# Patient Record
Sex: Male | Born: 1945
Health system: Southern US, Community
[De-identification: ages and names within clinical notes are randomized; demographics above are authoritative.]

## PROBLEM LIST (undated history)

## (undated) DIAGNOSIS — Z9621 Cochlear implant status: Secondary | ICD-10-CM

## (undated) DIAGNOSIS — G4730 Sleep apnea, unspecified: Secondary | ICD-10-CM

## (undated) DIAGNOSIS — K759 Inflammatory liver disease, unspecified: Secondary | ICD-10-CM

## (undated) DIAGNOSIS — F419 Anxiety disorder, unspecified: Secondary | ICD-10-CM

## (undated) DIAGNOSIS — T300 Burn of unspecified body region, unspecified degree: Secondary | ICD-10-CM

## (undated) DIAGNOSIS — E785 Hyperlipidemia, unspecified: Secondary | ICD-10-CM

## (undated) DIAGNOSIS — R3915 Urgency of urination: Secondary | ICD-10-CM

## (undated) DIAGNOSIS — E119 Type 2 diabetes mellitus without complications: Secondary | ICD-10-CM

## (undated) DIAGNOSIS — D689 Coagulation defect, unspecified: Secondary | ICD-10-CM

## (undated) DIAGNOSIS — C61 Malignant neoplasm of prostate: Secondary | ICD-10-CM

## (undated) DIAGNOSIS — J189 Pneumonia, unspecified organism: Secondary | ICD-10-CM

## (undated) DIAGNOSIS — K219 Gastro-esophageal reflux disease without esophagitis: Secondary | ICD-10-CM

## (undated) DIAGNOSIS — K2270 Barrett's esophagus without dysplasia: Secondary | ICD-10-CM

## (undated) DIAGNOSIS — M1990 Unspecified osteoarthritis, unspecified site: Secondary | ICD-10-CM

## (undated) DIAGNOSIS — Z86010 Personal history of colonic polyps: Secondary | ICD-10-CM

## (undated) DIAGNOSIS — F32A Depression, unspecified: Secondary | ICD-10-CM

## (undated) DIAGNOSIS — N281 Cyst of kidney, acquired: Secondary | ICD-10-CM

## (undated) DIAGNOSIS — I77810 Thoracic aortic ectasia: Secondary | ICD-10-CM

## (undated) DIAGNOSIS — I2510 Atherosclerotic heart disease of native coronary artery without angina pectoris: Secondary | ICD-10-CM

## (undated) DIAGNOSIS — R972 Elevated prostate specific antigen [PSA]: Secondary | ICD-10-CM

## (undated) DIAGNOSIS — I10 Essential (primary) hypertension: Secondary | ICD-10-CM

## (undated) DIAGNOSIS — H9192 Unspecified hearing loss, left ear: Secondary | ICD-10-CM

## (undated) DIAGNOSIS — H269 Unspecified cataract: Secondary | ICD-10-CM

## (undated) DIAGNOSIS — R531 Weakness: Secondary | ICD-10-CM

## (undated) DIAGNOSIS — G709 Myoneural disorder, unspecified: Secondary | ICD-10-CM

## (undated) DIAGNOSIS — R200 Anesthesia of skin: Secondary | ICD-10-CM

## (undated) DIAGNOSIS — D649 Anemia, unspecified: Secondary | ICD-10-CM

## (undated) DIAGNOSIS — F329 Major depressive disorder, single episode, unspecified: Secondary | ICD-10-CM

## (undated) HISTORY — DX: Anxiety disorder, unspecified: F41.9

## (undated) HISTORY — PX: CERVICAL LAMINECTOMY: SHX94

## (undated) HISTORY — PX: UPPER GASTROINTESTINAL ENDOSCOPY: SHX188

## (undated) HISTORY — DX: Coagulation defect, unspecified: D68.9

## (undated) HISTORY — DX: Sleep apnea, unspecified: G47.30

## (undated) HISTORY — PX: ESOPHAGOGASTRODUODENOSCOPY: SHX1529

## (undated) HISTORY — DX: Thoracic aortic ectasia: I77.810

## (undated) HISTORY — PX: KNEE SURGERY: SHX244

## (undated) HISTORY — PX: TONSILLECTOMY: SUR1361

## (undated) HISTORY — DX: Barrett's esophagus without dysplasia: K22.70

## (undated) HISTORY — PX: COCHLEAR IMPLANT: SUR684

## (undated) HISTORY — DX: Personal history of colonic polyps: Z86.010

## (undated) HISTORY — DX: Malignant neoplasm of prostate: C61

## (undated) HISTORY — DX: Cyst of kidney, acquired: N28.1

## (undated) HISTORY — DX: Elevated prostate specific antigen (PSA): R97.20

## (undated) HISTORY — PX: NOSE SURGERY: SHX723

## (undated) HISTORY — DX: Cochlear implant status: Z96.21

## (undated) HISTORY — DX: Anemia, unspecified: D64.9

## (undated) HISTORY — DX: Pneumonia, unspecified organism: J18.9

## (undated) HISTORY — DX: Myoneural disorder, unspecified: G70.9

## (undated) HISTORY — PX: HERNIA REPAIR: SHX51

## (undated) HISTORY — DX: Unspecified osteoarthritis, unspecified site: M19.90

## (undated) HISTORY — PX: OTHER SURGICAL HISTORY: SHX169

## (undated) HISTORY — PX: COLONOSCOPY: SHX174

## (undated) HISTORY — DX: Unspecified cataract: H26.9

## (undated) HISTORY — PX: EYE SURGERY: SHX253

---

## 2006-03-01 ENCOUNTER — Emergency Department (HOSPITAL_COMMUNITY): Admission: EM | Admit: 2006-03-01 | Discharge: 2006-03-01 | Payer: Self-pay | Admitting: Emergency Medicine

## 2006-12-10 ENCOUNTER — Encounter: Admission: RE | Admit: 2006-12-10 | Discharge: 2006-12-10 | Payer: Self-pay | Admitting: Specialist

## 2007-02-28 ENCOUNTER — Ambulatory Visit: Payer: Self-pay | Admitting: Internal Medicine

## 2007-02-28 DIAGNOSIS — I10 Essential (primary) hypertension: Secondary | ICD-10-CM | POA: Insufficient documentation

## 2007-03-02 LAB — CONVERTED CEMR LAB
CO2: 27 meq/L (ref 19–32)
GFR calc Af Amer: 126 mL/min
GFR calc non Af Amer: 104 mL/min
Potassium: 5.6 meq/L — ABNORMAL HIGH (ref 3.5–5.1)

## 2007-03-07 ENCOUNTER — Ambulatory Visit: Payer: Self-pay | Admitting: Internal Medicine

## 2007-03-17 LAB — CONVERTED CEMR LAB
BUN: 18 mg/dL (ref 6–23)
Calcium: 10.2 mg/dL (ref 8.4–10.5)
Potassium: 4.8 meq/L (ref 3.5–5.1)

## 2007-03-29 ENCOUNTER — Ambulatory Visit: Payer: Self-pay | Admitting: Internal Medicine

## 2007-03-29 DIAGNOSIS — K219 Gastro-esophageal reflux disease without esophagitis: Secondary | ICD-10-CM | POA: Insufficient documentation

## 2007-03-29 LAB — CONVERTED CEMR LAB
Bilirubin Urine: NEGATIVE
Nitrite: NEGATIVE
Specific Gravity, Urine: 1.015
Urobilinogen, UA: NEGATIVE
WBC Urine, dipstick: NEGATIVE
pH: 6.5

## 2007-04-11 LAB — CONVERTED CEMR LAB
AST: 20 units/L (ref 0–37)
Alkaline Phosphatase: 57 units/L (ref 39–117)
Bilirubin, Direct: 0.1 mg/dL (ref 0.0–0.3)
Calcium: 10.7 mg/dL — ABNORMAL HIGH (ref 8.4–10.5)
Cholesterol: 210 mg/dL (ref 0–200)
Creatinine, Ser: 0.8 mg/dL (ref 0.4–1.5)
Direct LDL: 122.4 mg/dL
Glucose, Bld: 92 mg/dL (ref 70–99)
HCT: 47.7 % (ref 39.0–52.0)
HDL: 65.2 mg/dL (ref >39.0)
Hemoglobin: 16.1 g/dL (ref 13.0–17.0)
Iron: 35 ug/dL — ABNORMAL LOW (ref 42–165)
Monocytes Absolute: 0.7 10*3/uL (ref 0.2–0.7)
Neutro Abs: 7.7 10*3/uL (ref 1.4–7.7)
Potassium: 4 meq/L (ref 3.5–5.1)
RBC: 5.24 M/uL (ref 4.22–5.81)
TSH: 1.29 microintl units/mL (ref 0.35–5.50)
VLDL: 16 mg/dL (ref 0–40)
WBC: 10.5 10*3/uL (ref 4.5–10.5)

## 2007-06-01 ENCOUNTER — Ambulatory Visit: Payer: Self-pay | Admitting: Internal Medicine

## 2007-07-01 ENCOUNTER — Ambulatory Visit: Payer: Self-pay | Admitting: Family Medicine

## 2007-07-04 ENCOUNTER — Encounter (INDEPENDENT_AMBULATORY_CARE_PROVIDER_SITE_OTHER): Payer: Self-pay | Admitting: *Deleted

## 2007-09-30 ENCOUNTER — Telehealth (INDEPENDENT_AMBULATORY_CARE_PROVIDER_SITE_OTHER): Payer: Self-pay | Admitting: *Deleted

## 2007-11-29 ENCOUNTER — Ambulatory Visit: Payer: Self-pay | Admitting: Internal Medicine

## 2007-12-01 ENCOUNTER — Encounter (INDEPENDENT_AMBULATORY_CARE_PROVIDER_SITE_OTHER): Payer: Self-pay | Admitting: *Deleted

## 2007-12-01 LAB — CONVERTED CEMR LAB
Folate: 4.8 ng/mL
Hemoglobin: 14.5 g/dL (ref 13.0–17.0)
Iron: 74 ug/dL (ref 42–165)
Transferrin: 264.2 mg/dL (ref >=212.0)
Vitamin B-12: 430 pg/mL (ref 211–911)

## 2008-03-13 ENCOUNTER — Ambulatory Visit: Payer: Self-pay | Admitting: Internal Medicine

## 2008-03-19 ENCOUNTER — Telehealth: Payer: Self-pay | Admitting: Internal Medicine

## 2008-04-13 ENCOUNTER — Ambulatory Visit: Payer: Self-pay | Admitting: Internal Medicine

## 2008-04-18 ENCOUNTER — Encounter (INDEPENDENT_AMBULATORY_CARE_PROVIDER_SITE_OTHER): Payer: Self-pay | Admitting: *Deleted

## 2008-04-18 LAB — CONVERTED CEMR LAB
ALT: 25 units/L (ref 0–53)
Cholesterol: 223 mg/dL (ref 0–200)
Direct LDL: 114.2 mg/dL
GFR calc Af Amer: 110 mL/min
GFR calc non Af Amer: 91 mL/min
Triglycerides: 65 mg/dL (ref 0–149)

## 2008-10-01 ENCOUNTER — Ambulatory Visit: Payer: Self-pay | Admitting: Internal Medicine

## 2008-10-01 DIAGNOSIS — R350 Frequency of micturition: Secondary | ICD-10-CM | POA: Insufficient documentation

## 2008-10-01 LAB — CONVERTED CEMR LAB
Nitrite: NEGATIVE
Protein, U semiquant: NEGATIVE
WBC Urine, dipstick: NEGATIVE

## 2008-10-04 ENCOUNTER — Telehealth (INDEPENDENT_AMBULATORY_CARE_PROVIDER_SITE_OTHER): Payer: Self-pay | Admitting: *Deleted

## 2008-10-04 DIAGNOSIS — E119 Type 2 diabetes mellitus without complications: Secondary | ICD-10-CM

## 2008-10-04 DIAGNOSIS — E1159 Type 2 diabetes mellitus with other circulatory complications: Secondary | ICD-10-CM | POA: Insufficient documentation

## 2008-11-19 ENCOUNTER — Encounter: Payer: Self-pay | Admitting: Internal Medicine

## 2008-11-21 ENCOUNTER — Encounter: Payer: Self-pay | Admitting: Internal Medicine

## 2008-11-21 ENCOUNTER — Encounter: Admission: RE | Admit: 2008-11-21 | Discharge: 2009-02-19 | Payer: Self-pay | Admitting: Internal Medicine

## 2009-01-15 ENCOUNTER — Encounter: Admission: RE | Admit: 2009-01-15 | Discharge: 2009-01-15 | Payer: Self-pay | Admitting: General Surgery

## 2009-02-07 ENCOUNTER — Encounter: Payer: Self-pay | Admitting: Internal Medicine

## 2009-04-08 ENCOUNTER — Encounter (INDEPENDENT_AMBULATORY_CARE_PROVIDER_SITE_OTHER): Payer: Self-pay | Admitting: *Deleted

## 2009-04-08 ENCOUNTER — Ambulatory Visit: Payer: Self-pay | Admitting: Internal Medicine

## 2009-04-08 LAB — CONVERTED CEMR LAB
Blood in Urine, dipstick: NEGATIVE
Glucose, Urine, Semiquant: NEGATIVE
Ketones, urine, test strip: NEGATIVE
Protein, U semiquant: NEGATIVE
Specific Gravity, Urine: 1.02
Urobilinogen, UA: NEGATIVE
pH: 5

## 2009-04-15 ENCOUNTER — Ambulatory Visit: Payer: Self-pay | Admitting: Internal Medicine

## 2009-04-15 LAB — CONVERTED CEMR LAB
Iron: 57 ug/dL (ref 42–165)
Vitamin B-12: 589 pg/mL (ref 211–911)

## 2009-04-16 ENCOUNTER — Ambulatory Visit: Payer: Self-pay | Admitting: Internal Medicine

## 2009-04-17 LAB — CONVERTED CEMR LAB
ALT: 23 units/L (ref 0–53)
AST: 20 units/L (ref 0–37)
BUN: 22 mg/dL (ref 6–23)
Basophils Absolute: 0.1 10*3/uL (ref 0.0–0.1)
Bilirubin Urine: NEGATIVE
Cholesterol: 175 mg/dL (ref 0–200)
Creatinine,U: 143.1 mg/dL
Eosinophils Absolute: 0.4 10*3/uL (ref 0.0–0.7)
Eosinophils Relative: 5.9 % — ABNORMAL HIGH (ref 0.0–5.0)
Glucose, Bld: 98 mg/dL (ref 70–99)
Hemoglobin, Urine: NEGATIVE
Ketones, ur: NEGATIVE mg/dL
LDL Cholesterol: 92 mg/dL (ref 0–99)
MCHC: 33.8 g/dL (ref 30.0–36.0)
Monocytes Absolute: 0.6 10*3/uL (ref 0.1–1.0)
Monocytes Relative: 7.6 % (ref 3.0–12.0)
Neutro Abs: 4.7 10*3/uL (ref 1.4–7.7)
Nitrite: NEGATIVE
Platelets: 193 10*3/uL (ref 150.0–400.0)
Potassium: 3.7 meq/L (ref 3.5–5.1)
Sodium: 143 meq/L (ref 135–145)
Total Protein, Urine: NEGATIVE mg/dL
Triglycerides: 85 mg/dL (ref 0.0–149.0)
VLDL: 17 mg/dL (ref 0.0–40.0)
WBC: 7.3 10*3/uL (ref 4.5–10.5)
pH: 5.5 (ref 5.0–8.0)

## 2009-04-27 HISTORY — PX: HIP ARTHROPLASTY: SHX981

## 2009-07-12 ENCOUNTER — Encounter: Admission: RE | Admit: 2009-07-12 | Discharge: 2009-07-12 | Payer: Self-pay | Admitting: Orthopedic Surgery

## 2009-08-08 ENCOUNTER — Ambulatory Visit: Payer: Self-pay | Admitting: Internal Medicine

## 2009-08-08 DIAGNOSIS — M1990 Unspecified osteoarthritis, unspecified site: Secondary | ICD-10-CM | POA: Insufficient documentation

## 2009-08-08 DIAGNOSIS — F17200 Nicotine dependence, unspecified, uncomplicated: Secondary | ICD-10-CM | POA: Insufficient documentation

## 2009-08-12 LAB — CONVERTED CEMR LAB: Hemoglobin: 15.1 g/dL (ref 13.0–17.0)

## 2009-09-04 ENCOUNTER — Telehealth: Payer: Self-pay | Admitting: Internal Medicine

## 2009-09-06 ENCOUNTER — Encounter: Payer: Self-pay | Admitting: Internal Medicine

## 2009-09-27 ENCOUNTER — Emergency Department (HOSPITAL_COMMUNITY): Admission: EM | Admit: 2009-09-27 | Discharge: 2009-09-27 | Payer: Self-pay | Admitting: Emergency Medicine

## 2009-09-30 ENCOUNTER — Encounter: Payer: Self-pay | Admitting: Internal Medicine

## 2009-10-01 ENCOUNTER — Inpatient Hospital Stay (HOSPITAL_COMMUNITY): Admission: RE | Admit: 2009-10-01 | Discharge: 2009-10-03 | Payer: Self-pay | Admitting: Orthopedic Surgery

## 2010-01-24 ENCOUNTER — Encounter: Admission: RE | Admit: 2010-01-24 | Discharge: 2010-01-24 | Payer: Self-pay | Admitting: Neurosurgery

## 2010-02-13 ENCOUNTER — Ambulatory Visit: Payer: Self-pay | Admitting: Internal Medicine

## 2010-02-14 LAB — CONVERTED CEMR LAB
BUN: 28 mg/dL — ABNORMAL HIGH (ref 6–23)
CO2: 26 meq/L (ref 19–32)
Calcium: 9.9 mg/dL (ref 8.4–10.5)
Chloride: 106 meq/L (ref 96–112)
Creatinine, Ser: 0.8 mg/dL (ref 0.4–1.5)
Potassium: 5.7 meq/L — ABNORMAL HIGH (ref 3.5–5.1)

## 2010-02-24 ENCOUNTER — Telehealth: Payer: Self-pay | Admitting: Internal Medicine

## 2010-02-26 ENCOUNTER — Encounter: Payer: Self-pay | Admitting: Internal Medicine

## 2010-03-14 ENCOUNTER — Ambulatory Visit: Payer: Self-pay | Admitting: Internal Medicine

## 2010-03-19 ENCOUNTER — Encounter: Payer: Self-pay | Admitting: Internal Medicine

## 2010-05-18 ENCOUNTER — Encounter: Payer: Self-pay | Admitting: Orthopedic Surgery

## 2010-05-27 NOTE — Letter (Signed)
Summary: Surgical Clearance/[facility]  Surgical Clearance/[facility]   Imported By: Lanelle Bal 09/12/2009 12:56:18  _____________________________________________________________________  External Attachment:    Type:   Image     Comment:   External Document

## 2010-05-27 NOTE — Assessment & Plan Note (Signed)
Summary: 6 month roa//lch   Vital Signs:  Patient profile:   65 year old male Weight:      191.25 pounds Pulse rate:   82 / minute Pulse rhythm:   regular BP sitting:   144 / 88  (left arm) Cuff size:   large  Vitals Entered By: Army Fossa CMA (February 13, 2010 8:00 AM) CC: 6 month f/u on BP- fasting Comments c/o legs being weak (has had an MRI)    History of Present Illness: OA--s/p L hip replacement  6-11, doing well   legs feel weak-hurt ("like when you work really hard") , x 2 months, sx   worse at night  saw DR Charlann Boxer then  DR Wynetta Emery (neurosurgery)  he had a  MRI CSpine , NCS (results pending)  Hypertension-- ambulatory BPs good    DM -- on diet only, doing ok , no ambulatory CBGs     Current Medications (verified): 1)  Antacids 2)  Benazepril Hcl 40 Mg  Tabs (Benazepril Hcl) .Marland Kitchen.. 1 By Mouth Q Am 3)  Hydrochlorothiazide 25 Mg  Tabs (Hydrochlorothiazide) .Marland Kitchen.. 1 By Mouth Qd 4)  Aspirin Ec 81 Mg  Tbec (Aspirin) .Marland Kitchen.. 1 Once Daily 5)  Prilosec Otc 20 Mg Tbec (Omeprazole Magnesium) .Marland Kitchen.. 1 A Day 6)  Ibuprofen 200 Mg Tabs (Ibuprofen) .... 3 Tabs Daily  Allergies (verified): No Known Drug Allergies  Past History:  Past Medical History: Reviewed history from 08/08/2009 and no changes required. Hypertension GERD DM Dx 09-2008 A1C 6.1 Osteoarthritis  Past Surgical History: Cervical laminectomy Tonsillectomy knee surgery before, sees Dr. 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 GI:  Denies diarrhea, nausea, and vomiting.  Physical Exam  General:  alert and well-developed.   Lungs:  normal respiratory effort, no intercostal retractions, no accessory muscle use, and  normal breath sounds.   Heart:  normal rate, regular rhythm, and no murmur.   Psych:  seems in good spirits   Impression & Recommendations:  Problem # 1:  OSTEOARTHRITIS (ICD-715.90) developed LE weakness pain after hip replacement, w/u per ortho and neurosurgery  His updated medication list for this problem includes:    Aspirin Ec 81 Mg Tbec (Aspirin) .Marland Kitchen... 1 once daily    Ibuprofen 200 Mg Tabs (Ibuprofen) .Marland KitchenMarland KitchenMarland KitchenMarland Kitchen 3 tabs daily  Problem # 2:  DM (ICD-250.00) labs  His updated medication list for this problem includes:    Benazepril Hcl 40 Mg Tabs (Benazepril hcl) .Marland Kitchen... 1 by mouth q am    Aspirin Ec 81 Mg Tbec (Aspirin) .Marland Kitchen... 1 once daily  Orders: TLB-A1C / Hgb A1C (Glycohemoglobin) (83036-A1C) Specimen Handling ([address])  Labs Reviewed: Creat: 0.8 (04/08/2009)    Reviewed HgBA1c results: 6.1 (08/08/2009)  6.2 (04/08/2009)  Problem # 3:  HYPERTENSION (ICD-401.9) no change for now, labs His updated medication list for this problem includes:    Benazepril Hcl 40 Mg Tabs (Benazepril hcl) .Marland Kitchen... 1 by mouth q am    Hydrochlorothiazide 25 Mg Tabs (Hydrochlorothiazide) .Marland Kitchen... 1 by mouth qd  Orders: Venipuncture (60454) TLB-BMP (Basic Metabolic Panel-BMET) (80048-METABOL) Specimen Handling (09811)  BP today: 144/88 Prior BP: 126/82 (08/08/2009)  Labs Reviewed: K+: 3.7 (04/08/2009) Creat: : 0.8 (04/08/2009)   Chol: 175 (04/08/2009)   HDL: 66.30 (04/08/2009)  LDL: 92 (04/08/2009)   TG: 85.0 (04/08/2009)  Complete Medication List: 1)  Antacids  2)  Benazepril Hcl 40 Mg Tabs (Benazepril hcl) .Marland Kitchen.. 1 by mouth q am 3)  Hydrochlorothiazide 25 Mg Tabs (Hydrochlorothiazide) .Marland Kitchen.. 1 by mouth qd 4)  Aspirin Ec 81 Mg Tbec (Aspirin) .Marland Kitchen.. 1 once daily 5)  Prilosec Otc 20 Mg Tbec (Omeprazole magnesium) .Marland Kitchen.. 1 a day 6)  Ibuprofen 200 Mg Tabs (Ibuprofen) .... 3 tabs daily  Other Orders: Admin 1st Vaccine ([address]) Flu Vaccine 19yrs + (60454)  Patient Instructions: 1)  Please schedule a  follow-up appointment in 4 months , fasting, physical exam Prescriptions: HYDROCHLOROTHIAZIDE 25 MG  TABS (HYDROCHLOROTHIAZIDE) 1 by mouth qd  #90 Each x 1   Entered by:   Army Fossa CMA   Authorized by:   Nolon Rod. [name] MD   Signed by:   Nolon Rod. [name] MD on 02/13/2010   Method used:   Electronically to        [facility]  [phone]* (retail)       [address]       [place], [place]  [address]       Ph: [phone] or [phone]       Fax: [phone]   RxID:   [id number] BENAZEPRIL HCL 40 MG  TABS (BENAZEPRIL HCL) 1 by mouth q am  #90 Each x 1   Entered by:   Army Fossa CMA   Authorized by:   Nolon Rod. [name] MD   Signed by:   Nolon Rod. [name] MD on 02/13/2010   Method used:   Electronically to        [facility]  [phone]* (retail)       [address]       [place], [place]  [address]       Ph: [phone] or [phone]       Fax: [phone]   RxID:   [id number]    Orders Added: 1)  Venipuncture [83151] 2)  TLB-BMP (Basic Metabolic Panel-BMET) [80048-METABOL] 3)  TLB-A1C / Hgb A1C (Glycohemoglobin) [83036-A1C] 4)  Specimen Handling [99000] 5)  Admin 1st Vaccine [90471] 6)  Flu Vaccine 65yrs + [76160] 7)  Est. Patient Level III [73710] Flu Vaccine Consent Questions     Do you have a history of severe allergic reactions to this vaccine? no    Any prior history of allergic reactions to egg and/or gelatin? no    Do you have a sensitivity to the preservative Thimersol? no    Do you have a past history of Guillan-Barre Syndrome? no    Do you currently have an acute febrile illness? no    Have you ever had a severe reaction to latex? no    Vaccine information given and explained to patient? yes    Are you currently pregnant? no    Lot Number:[id number]   Exp Date:10/25/2010   Site Given  Left Deltoid IM     .[email or web address]

## 2010-05-27 NOTE — Progress Notes (Signed)
Summary: clearance for surgery  Phone Note Call from Patient Call back at Home Phone [phone]   Summary of Call: Patient left message on VM -- pt is scheduled for hip replacement 10/01/09.  Surgeons office is to fax over form.  Will you clear him for surgery? William Norton  Sep 04, 2009 2:32 PM   Follow-up for Phone Call        64 year old with well-controlled diabetes and hypertension, no history of coronary artery disease.  In the past he has denied chest pain or shortness of breath. He will be clear from my standpoint as long as he does not have chest pain, shortness of breath, edema. Arlene Genova E. William Gewirtz MD  Sep 04, 2009 3:52 PM   Faxed to Dr. Charlann Boxer - [phone] William Norton  Sep 04, 2009 4:20 PM

## 2010-05-27 NOTE — Letter (Signed)
Summary: needs potassium  recheck  [place] [facility]  [address] [place], [place] [address]   Phone: [phone]  Fax: [phone]    02/26/2010    William Norton [address] [place], [place]  [address]  Dear Gerlene Burdock,   On your recent labs, I noticed the potassium to be elevated. This is a potentially dangerous situation. If  your potassium goes  very high it may cause muscle weakness and heart problems.   The only way to know if your potassium is back to normal, is by rechecking it with another blood test.    I encourage you to reconsider your decision not to check your potassium. Please call any time and schedule your blood test at your earliest convenience,    Sincerely,        Willow Ora MD  Appended Document: needs potassium  recheck mailed to pt.

## 2010-05-27 NOTE — Letter (Signed)
Summary: [facility]  [facility]   Imported By: Sherian Rein 10/21/2009 14:51:19  _____________________________________________________________________  External Attachment:    Type:   Image     Comment:   External Document

## 2010-05-27 NOTE — Progress Notes (Signed)
Summary: K+ recheck  Phone Note Outgoing Call   Summary of Call: needs his K rechecked... Maxwel Meadowcroft E. Winston Misner MD  February 24, 2010 11:24 AM   Follow-up for Phone Call        Spoke with patient and he claims that he had to many bananas before the lab appt and refuses to come back in for the blood work.  Follow-up by: Harold Barban,  February 24, 2010 11:48 AM  Additional Follow-up for Phone Call Additional follow up Details #1::        see letter Additional Follow-up by: [facility] E. Carrine Kroboth MD,  February 26, 2010 3:22 PM

## 2010-05-27 NOTE — Letter (Signed)
Summary: Rx neurology referal--- Neurosurgery  [facility]   Imported By: Lanelle Bal 04/02/2010 14:38:10  _____________________________________________________________________  External Attachment:    Type:   Image     Comment:   External Document  Appended Document: Rx neurology referal--- Neurosurgery surgery recommended a neurology referal. Please ask patient if they arranged or we need to do it  Appended Document: Rx neurology referal--- Neurosurgery I spoke w/ pt he states that they are in the process of setting it up. Informed pt he if did not hear anything to please call us back.

## 2010-05-27 NOTE — Assessment & Plan Note (Signed)
Summary: 4 mo. f/u - jr   Vital Signs:  Patient profile:   65 year old male Height:      71 inches Weight:      187.6 pounds BMI:     26.26 Pulse rate:   60 / minute BP sitting:   126 / 82  Vitals Entered By: Shary Decamp (August 08, 2009 8:02 AM) CC: rov - fasting Comments  - pt had cortisone injection in left hip for arthritis Shary Decamp  August 08, 2009 8:03 AM    History of Present Illness: ROV feels well   Current Medications (verified): 1)  Antacids 2)  Tylenol 3)  Benazepril Hcl 40 Mg  Tabs (Benazepril Hcl) .Marland Kitchen.. 1 By Mouth Q Am 4)  Hydrochlorothiazide 25 Mg  Tabs (Hydrochlorothiazide) .Marland Kitchen.. 1 By Mouth Qd 5)  Aspirin Ec 81 Mg  Tbec (Aspirin) .Marland Kitchen.. 1 Once Daily 6)  Prilosec Otc 20 Mg Tbec (Omeprazole Magnesium) .Marland Kitchen.. 1 A Day  Allergies (verified): No Known Drug Allergies  Past History:  Past Medical History: Hypertension GERD DM Dx 09-2008 A1C 6.1 Osteoarthritis  Past Surgical History: Reviewed history from 04/08/2009 and no changes required. Cervical laminectomy Tonsillectomy knee surgery before, sees Dr. 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   Lungs:  normal respiratory effort, no intercostal retractions, no accessory muscle use, and normal breath sounds.   Heart:  normal rate, regular rhythm, and no murmur.   Extremities:  no pretibial edema bilaterally    Impression & Recommendations:  Problem # 1:  OSTEOARTHRITIS (ICD-715.90) Assessment New s/p hip injection, better  His updated medication  list for this problem includes:    Aspirin Ec 81 Mg Tbec (Aspirin) .Marland Kitchen... 1 once daily  Problem # 2:  DM (ICD-250.00)  diet only , labs  His updated medication list for this problem includes:    Benazepril Hcl 40 Mg Tabs (Benazepril hcl) .Marland Kitchen... 1 by mouth q am    Aspirin Ec 81 Mg Tbec (Aspirin) .Marland Kitchen... 1 once daily  Labs Reviewed: Creat: 0.8 (04/08/2009)    Reviewed HgBA1c results: 6.2 (04/08/2009)  6.1 (10/01/2008)  Orders: Venipuncture ([address]) TLB-A1C / Hgb A1C (Glycohemoglobin) (83036-A1C)  Problem # 3:  HYPERTENSION (ICD-401.9)  at goal  His updated medication list for this problem includes:    Benazepril Hcl 40 Mg Tabs (Benazepril hcl) .Marland Kitchen... 1 by mouth q am    Hydrochlorothiazide 25 Mg Tabs (Hydrochlorothiazide) .Marland Kitchen... 1 by mouth qd  BP today: 126/82 Prior BP: 118/80 (04/08/2009)  Labs Reviewed: K+: 3.7 (04/08/2009) Creat: : 0.8 (04/08/2009)   Chol: 175 (04/08/2009)   HDL: 66.30 (04/08/2009)   LDL: 92 (04/08/2009)   TG: 85.0 (04/08/2009)  Orders: TLB-Hemoglobin (Hgb) (85018-HGB)  Problem # 4:  TOBACCO ABUSE (ICD-305.1) counseled about tobacco  Complete Medication List: 1)  Antacids  2)  Tylenol  3)  Benazepril Hcl 40 Mg Tabs (Benazepril hcl) .Marland Kitchen.. 1 by mouth q am 4)  Hydrochlorothiazide 25 Mg Tabs (Hydrochlorothiazide) .Marland Kitchen.. 1 by mouth qd 5)  Aspirin Ec 81 Mg Tbec (Aspirin) .Marland Kitchen.. 1 once  daily 6)  Prilosec Otc 20 Mg Tbec (Omeprazole magnesium) .Marland Kitchen.. 1 a day  Patient Instructions: 1)  Please schedule a follow-up appointment in 6  months .  Prescriptions: HYDROCHLOROTHIAZIDE 25 MG  TABS (HYDROCHLOROTHIAZIDE) 1 by mouth qd  #90 Each x 1   Entered by:   Shary Decamp   Authorized by:   Nolon Rod. Xavien Dauphinais MD   Signed by:   Shary Decamp on 08/08/2009   Method used:   Electronically to        [facility]  [phone]* (retail)       [address]       [place], [place]  [address]       Ph: [phone] or [phone]       Fax: [phone]    RxID:   [id number] BENAZEPRIL HCL 40 MG  TABS (BENAZEPRIL HCL) 1 by mouth q am  #90 Each x 1   Entered by:   Shary Decamp   Authorized by:   Nolon Rod. Martino Tompson MD   Signed by:   Shary Decamp on 08/08/2009   Method used:   Electronically to        [facility]  [phone]* (retail)       [address]       [place], [place]  [address]       Ph: [phone] or [phone]       Fax: [phone]   RxID:   [id number]

## 2010-06-09 ENCOUNTER — Encounter: Payer: Self-pay | Admitting: Internal Medicine

## 2010-06-09 ENCOUNTER — Encounter (INDEPENDENT_AMBULATORY_CARE_PROVIDER_SITE_OTHER): Payer: Self-pay | Admitting: *Deleted

## 2010-06-09 ENCOUNTER — Encounter (INDEPENDENT_AMBULATORY_CARE_PROVIDER_SITE_OTHER): Payer: No Typology Code available for payment source | Admitting: Internal Medicine

## 2010-06-09 ENCOUNTER — Other Ambulatory Visit: Payer: Self-pay | Admitting: Internal Medicine

## 2010-06-09 DIAGNOSIS — Z23 Encounter for immunization: Secondary | ICD-10-CM

## 2010-06-09 DIAGNOSIS — Z0000 Encounter for general adult medical examination without abnormal findings: Secondary | ICD-10-CM

## 2010-06-09 DIAGNOSIS — E119 Type 2 diabetes mellitus without complications: Secondary | ICD-10-CM

## 2010-06-09 LAB — BASIC METABOLIC PANEL
Chloride: 98 mEq/L (ref 96–112)
GFR: 86.45 mL/min (ref >60.00)
Glucose, Bld: 98 mg/dL (ref 70–99)

## 2010-06-09 LAB — LIPID PANEL
Cholesterol: 193 mg/dL (ref 0–200)
HDL: 74 mg/dL (ref >39.00)
LDL Cholesterol: 102 mg/dL — ABNORMAL HIGH (ref 0–99)
Triglycerides: 87 mg/dL (ref 0.0–149.0)
VLDL: 17.4 mg/dL (ref 0.0–40.0)

## 2010-06-09 LAB — CBC WITH DIFFERENTIAL/PLATELET
Eosinophils Absolute: 0.2 10*3/uL (ref 0.0–0.7)
HCT: 41.2 % (ref 39.0–52.0)
Lymphocytes Relative: 17.9 % (ref 12.0–46.0)
Lymphs Abs: 2 10*3/uL (ref 0.7–4.0)
MCHC: 34.3 g/dL (ref 30.0–36.0)
Monocytes Absolute: 0.6 10*3/uL (ref 0.1–1.0)
Neutrophils Relative %: 74.5 % (ref 43.0–77.0)
RDW: 14.2 % (ref 11.5–14.6)
WBC: 11.2 10*3/uL — ABNORMAL HIGH (ref 4.5–10.5)

## 2010-06-09 LAB — PSA: PSA: 3 ng/mL (ref 0.10–4.00)

## 2010-06-09 LAB — AST: AST: 21 U/L (ref 0–37)

## 2010-06-09 LAB — B12 AND FOLATE PANEL
Folate: 10.1 ng/mL (ref >5.9)
Vitamin B-12: 912 pg/mL — ABNORMAL HIGH (ref 211–911)

## 2010-06-16 ENCOUNTER — Encounter: Payer: Self-pay | Admitting: Internal Medicine

## 2010-06-18 NOTE — Letter (Signed)
Summary: [facility]: Immunoassay Fecal Occult Blood (iFOB) Order Form  [place] [facility]  [address] [place], [place] [address]   Phone: [phone]  Fax: [phone]      [facility]: Immunoassay Fecal Occult Blood (iFOB) Order Form   June 09, 2010 MRN: [id number]   MARSHALL KAMPF Jun 19, 1945   Physicican Name:______jose paz,md___________________  Diagnosis Code:______v76.51____________________      Army Fossa CMA

## 2010-06-18 NOTE — Assessment & Plan Note (Signed)
Summary: CPX PATIENT FASTING/[facility]   Vital Signs:  Patient profile:   65 year old male Height:      71 inches Weight:      202.25 pounds BMI:     28.31 Pulse rate:   82 / minute Pulse rhythm:   regular BP sitting:   132 / 86  (left arm) Cuff size:   large  Vitals Entered By: Army Fossa CMA (June 09, 2010 8:03 AM) CC: CPX, fasting  Comments seeing a neurologist on Monday- legs hurting while walking  [facility] declines pneumovax, not had colonscopy.    History of Present Illness: CPX  continue w/ LE weakness, pain, UE parethesias at night-- undergoing w/u by neurology, neurosurgery  Preventive Screening-Counseling & Management  Alcohol-Tobacco     Packs/Day: 1 pck  Current Medications (verified): 1)  Antacids 2)  Benazepril Hcl 40 Mg  Tabs (Benazepril Hcl) .Marland Kitchen.. 1 By Mouth Q Am 3)  Hydrochlorothiazide 25 Mg  Tabs (Hydrochlorothiazide) .Marland Kitchen.. 1 By Mouth Qd 4)  Prilosec Otc 20 Mg Tbec (Omeprazole Magnesium) .Marland Kitchen.. 1 A Day 5)  Ibuprofen 200 Mg Tabs (Ibuprofen) .... 3 Tabs Daily  Allergies (verified): No Known Drug Allergies  Past History:  Past Medical History: Hypertension GERD DM Dx 09-2008 A1C 6.1 Osteoarthritis 2011...leg  weakness , pain, ABIs normal ,seen neurology-neurosurgery  Past Surgical History: Reviewed history from 02/13/2010 and no changes required. Cervical laminectomy Tonsillectomy knee surgery before, sees Dr. 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 CV:  Denies chest pain or discomfort and swelling of feet. Resp:  Denies cough and shortness of breath. GI:  Denies abdominal pain, bloody stools, diarrhea, and  nausea; some GERD symptoms lately, thinks related to eating late. GU:  Denies dysuria and hematuria. Psych:  Denies anxiety and depression.  Physical Exam  General:  alert, well-developed, and well-nourished.   Neck:  no thyromegaly and normal carotid upstroke.   Lungs:  normal respiratory effort, no intercostal retractions, no accessory muscle use, and normal breath sounds.   Heart:  normal rate, regular rhythm, and no murmur.   Abdomen:  soft, non-tender, no distention, no masses, no guarding, and no rigidity.   Rectal:  no hemorrhoids, normal sphincter tone, and no masses.   Prostate:  no gland enlargement and no induration.  ? R nodule, 3 mm (no tender, no hard ) Extremities:  no pretibial edema bilaterally    Impression & Recommendations:  Problem # 1:  HEALTH SCREENING (ICD-V70.0) Td 2008   got a shot already pneumonia shot 06-09-10 Shingles immunization discussed   never had a Cscope  iFOB neg 12-10 Colonoscopy Vs.iFOB cards reviewed w/ pt. Provided  iFOB but he will  call if he decides to have a  colonoscopy   diet-exercise discussed still smoking, counseled  labs including iron  and a CBC, Hg was slightly low last year    Orders: Venipuncture ([address]) TLB-CBC Platelet - w/Differential (85025-CBCD) TLB-IBC Pnl (Iron/FE;Transferrin) (83550-IBC) TLB-Lipid Panel (80061-LIPID) TLB-PSA (Prostate Specific Antigen) (84153-PSA) TLB-BMP (Basic Metabolic Panel-BMET) (80048-METABOL) TLB-ALT (SGPT) (84460-ALT) TLB-AST (SGOT) (84450-SGOT) TLB-B12 + Folate Pnl (60454_09811-B14/NWG) Specimen Handling (95621)  Problem # 2:  ? of NODULAR PROSTATE WITHOUT URINARY OBSTRUCTION (ICD-600.10)  continue to  have them know 2. PSA was normal last year. Check a PSA Urology referral  Orders: Urology Referral (Urology)  Problem # 3:  DM (ICD-250.00)  The following medications were removed from the medication list:    Aspirin Ec 81 Mg Tbec (Aspirin) .Marland Kitchen... 1 once daily His updated  medication list for this problem includes:    Benazepril Hcl 40 Mg Tabs (Benazepril hcl) .Marland Kitchen... 1 by mouth q am    Labs Reviewed: Creat: 0.8 (02/13/2010)    Reviewed HgBA1c results: 6.4 (02/13/2010)  6.1 (08/08/2009)  Orders: TLB-A1C / Hgb A1C (Glycohemoglobin) (83036-A1C)  Complete Medication List: 1)  Antacids  2)  Benazepril Hcl 40 Mg Tabs (Benazepril hcl) .Marland Kitchen.. 1 by mouth q am 3)  Hydrochlorothiazide 25 Mg Tabs (Hydrochlorothiazide) .Marland Kitchen.. 1 by mouth qd 4)  Prilosec Otc 20 Mg Tbec (Omeprazole magnesium) .Marland Kitchen.. 1 a day 5)  Ibuprofen 200 Mg Tabs (Ibuprofen) .... 3 tabs daily  Other Orders: Pneumococcal Vaccine ([address]) Admin 1st Vaccine (60[address])  Patient Instructions: 1)  Please schedule a follow-up appointment in 4 to 6 months .    Orders Added: 1)  Venipuncture [36415] 2)  TLB-CBC Platelet - w/Differential [85025-CBCD] 3)  TLB-IBC Pnl (Iron/FE;Transferrin) [83550-IBC] 4)  TLB-Lipid Panel [80061-LIPID] 5)  TLB-PSA (Prostate Specific Antigen) [84153-PSA] 6)  TLB-BMP (Basic Metabolic Panel-BMET) [80048-METABOL] 7)  TLB-ALT (SGPT) [84460-ALT] 8)  TLB-AST (SGOT) [84450-SGOT] 9)  TLB-B12 + Folate Pnl [82746_82607-B12/FOL] 10)  TLB-A1C / Hgb A1C (Glycohemoglobin) [83036-A1C] 11)  Specimen Handling [99000] 12)  Pneumococcal Vaccine [90732] 13)  Admin 1st Vaccine [90471] 14)  Est. Patient age 80&> [phone] 36)  Urology Referral [Urology]   Immunizations Administered:  Pneumonia Vaccine:    Vaccine Type: Pneumovax    Site: left deltoid    Mfr: Merck    Dose: 0.5 ml    Route: IM    Given by: Army Fossa CMA    Exp. Date: 08/27/2011    Lot #: [id number]   Immunizations Administered:  Pneumonia Vaccine:    Vaccine Type: Pneumovax    Site: left deltoid    Mfr: Merck    Dose: 0.5 ml    Route: IM    Given by: Army Fossa CMA    Exp. Date: 08/27/2011    Lot #: [id number]   Risk Factors:  Alcohol use:  yes

## 2010-06-24 NOTE — Consult Note (Signed)
Summary: [facility]  [facility]   Imported By: Maryln Gottron 06/20/2010 12:39:20  _____________________________________________________________________  External Attachment:    Type:   Image     Comment:   External Document

## 2010-07-14 LAB — URINALYSIS, ROUTINE W REFLEX MICROSCOPIC
Nitrite: NEGATIVE
Protein, ur: NEGATIVE mg/dL
Specific Gravity, Urine: 1.013 (ref 1.005–1.030)
Urobilinogen, UA: 0.2 mg/dL (ref 0.0–1.0)

## 2010-07-14 LAB — BASIC METABOLIC PANEL
BUN: 10 mg/dL (ref 6–23)
BUN: 17 mg/dL (ref 6–23)
Chloride: 101 mEq/L (ref 96–112)
Chloride: 101 mEq/L (ref 96–112)
Chloride: 102 mEq/L (ref 96–112)
Creatinine, Ser: 0.81 mg/dL (ref 0.4–1.5)
Creatinine, Ser: 0.84 mg/dL (ref 0.4–1.5)
GFR calc Af Amer: >60 mL/min (ref >60)
GFR calc non Af Amer: >60 mL/min (ref >60)
GFR calc non Af Amer: >60 mL/min (ref >60)
Glucose, Bld: 116 mg/dL — ABNORMAL HIGH (ref 70–99)
Potassium: 4.4 mEq/L (ref 3.5–5.1)

## 2010-07-14 LAB — CBC
HCT: 31.8 % — ABNORMAL LOW (ref 39.0–52.0)
MCV: 85.3 fL (ref 78.0–100.0)
MCV: 87.2 fL (ref 78.0–100.0)
MCV: 86.7 fL (ref 78.0–100.0)
Platelets: 240 10*3/uL (ref 150–400)
Platelets: 249 10*3/uL (ref 150–400)
RBC: 4.71 MIL/uL (ref 4.22–5.81)
RDW: 14.1 % (ref 11.5–15.5)
WBC: 6.3 10*3/uL (ref 4.0–10.5)
WBC: 5.9 10*3/uL (ref 4.0–10.5)

## 2010-07-14 LAB — TYPE AND SCREEN: Antibody Screen: NEGATIVE

## 2010-07-14 LAB — DIFFERENTIAL
Lymphocytes Relative: 25 % (ref 12–46)
Lymphs Abs: 1.4 10*3/uL (ref 0.7–4.0)
Monocytes Relative: 11 % (ref 3–12)
Neutro Abs: 3.5 10*3/uL (ref 1.7–7.7)
Neutrophils Relative %: 60 % (ref 43–77)

## 2010-07-14 LAB — APTT: aPTT: 39 seconds — ABNORMAL HIGH (ref 24–37)

## 2010-07-14 LAB — PROTIME-INR: INR: 1.08 (ref 0.00–1.49)

## 2010-07-25 DIAGNOSIS — Z1211 Encounter for screening for malignant neoplasm of colon: Secondary | ICD-10-CM

## 2010-07-28 ENCOUNTER — Ambulatory Visit: Payer: No Typology Code available for payment source | Attending: Neurology | Admitting: Physical Therapy

## 2010-07-28 DIAGNOSIS — Z96649 Presence of unspecified artificial hip joint: Secondary | ICD-10-CM | POA: Insufficient documentation

## 2010-07-28 DIAGNOSIS — IMO0001 Reserved for inherently not codable concepts without codable children: Secondary | ICD-10-CM | POA: Insufficient documentation

## 2010-07-28 DIAGNOSIS — R269 Unspecified abnormalities of gait and mobility: Secondary | ICD-10-CM | POA: Insufficient documentation

## 2010-08-05 ENCOUNTER — Ambulatory Visit: Payer: No Typology Code available for payment source | Admitting: Physical Therapy

## 2010-08-11 ENCOUNTER — Ambulatory Visit: Payer: No Typology Code available for payment source | Admitting: Physical Therapy

## 2010-08-13 ENCOUNTER — Other Ambulatory Visit: Payer: Self-pay | Admitting: Internal Medicine

## 2010-08-18 ENCOUNTER — Ambulatory Visit: Payer: No Typology Code available for payment source | Admitting: Physical Therapy

## 2010-08-25 ENCOUNTER — Ambulatory Visit: Payer: No Typology Code available for payment source | Admitting: Physical Therapy

## 2010-12-08 ENCOUNTER — Ambulatory Visit (INDEPENDENT_AMBULATORY_CARE_PROVIDER_SITE_OTHER): Payer: No Typology Code available for payment source | Admitting: Internal Medicine

## 2010-12-08 ENCOUNTER — Encounter: Payer: Self-pay | Admitting: Internal Medicine

## 2010-12-08 DIAGNOSIS — N402 Nodular prostate without lower urinary tract symptoms: Secondary | ICD-10-CM

## 2010-12-08 DIAGNOSIS — E119 Type 2 diabetes mellitus without complications: Secondary | ICD-10-CM

## 2010-12-08 DIAGNOSIS — I10 Essential (primary) hypertension: Secondary | ICD-10-CM

## 2010-12-08 LAB — HEMOGLOBIN A1C: Hgb A1c MFr Bld: 6.2 % (ref 4.6–6.5)

## 2010-12-08 LAB — BASIC METABOLIC PANEL
Calcium: 9.4 mg/dL (ref 8.4–10.5)
Creatinine, Ser: 0.9 mg/dL (ref 0.4–1.5)
GFR: 94.47 mL/min (ref >60.00)
Sodium: 133 mEq/L — ABNORMAL LOW (ref 135–145)

## 2010-12-08 NOTE — Patient Instructions (Signed)
Remember to check your eyes for diabetes yearly

## 2010-12-08 NOTE — Assessment & Plan Note (Signed)
Normal BP today, no change

## 2010-12-08 NOTE — Assessment & Plan Note (Addendum)
Was seen by urology, they elected observation, he will be checked every 6 months.

## 2010-12-08 NOTE — Assessment & Plan Note (Signed)
Diet control, labs rec to see eye doctor yearly

## 2010-12-08 NOTE — Progress Notes (Signed)
  Subjective:    Patient ID: William Norton, male    DOB: 1946-03-20, 65 y.o.   MRN: [id number]  HPI Six-month followup Doing well. Was found to have a prostate nodule, he saw urology, note reviewed, they discussed biopsy versus observation, patient elected observation. Diabetes--no ambulatory CBGs, diet is pretty good "the best it has been in a long time" . HTN: good med compliance   Past Medical History: Hypertension GERD DM Dx 09-2008 A1C 6.1 Osteoarthritis 2011...leg  weakness , pain, ABIs normal ,seen neurology-neurosurgery  Past Surgical History: Cervical laminectomy Tonsillectomy knee surgery before, sees Dr. 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 No dysuria or gross hematuria Occasional dizziness and occasional knee pain.     Objective:   Physical Exam  Constitutional: He is oriented to person, place, and time. He appears well-developed and well-nourished. No distress.  HENT:  Head: Normocephalic and atraumatic.  Cardiovascular: Normal rate and normal heart sounds.   No murmur heard. Pulmonary/Chest: Effort normal and breath sounds normal. No respiratory distress. He has no wheezes. He has no rales.  Musculoskeletal: He exhibits no edema and no tenderness.  Neurological: He is alert and oriented to person, place, and time. No cranial nerve deficit. Coordination normal.  Skin: He is not diaphoretic.  Psychiatric: He has a normal mood and affect. His behavior is normal. Judgment and thought content normal.          Assessment & Plan:

## 2010-12-09 ENCOUNTER — Telehealth: Payer: Self-pay | Admitting: *Deleted

## 2010-12-09 DIAGNOSIS — E871 Hypo-osmolality and hyponatremia: Secondary | ICD-10-CM

## 2010-12-09 NOTE — Telephone Encounter (Signed)
Message copied by Regis Bill on Tue Dec 09, 2010  4:41 PM ------      Message from: Willow Ora E      Created: Tue Dec 09, 2010  1:47 PM       Advise patient:      Diabetes continue to be well-controlled.      His sodium is slightly low, please arrange for a BMP in 6 weeks to recheck---dx  hyponatremia

## 2010-12-09 NOTE — Telephone Encounter (Signed)
Pt Informed. Will call & schedule lab appointment week of 01/19/11 when calendar is available. Order has been placed.

## 2011-02-05 ENCOUNTER — Other Ambulatory Visit: Payer: Self-pay | Admitting: Internal Medicine

## 2011-02-06 NOTE — Telephone Encounter (Signed)
Done

## 2011-02-18 ENCOUNTER — Ambulatory Visit: Payer: No Typology Code available for payment source

## 2011-02-19 ENCOUNTER — Telehealth: Payer: Self-pay | Admitting: *Deleted

## 2011-02-19 NOTE — Telephone Encounter (Signed)
Pt requesting call back - no information given. [facility] w/contact name & number for patient to please call w/more general information as to what his call is concerning at earliest convenience.

## 2011-02-19 NOTE — Telephone Encounter (Signed)
Patient is requesting recommendation on receiving Tdap booster [Td given 03/2007], Shingles vaccine, & Flu vaccine [last 02/13/10]. Patient states that he has new grandchild and would like to have Pertussis vaccine.  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  Informed patient that we would give information to MD for recommendations as to how vaccines can be administered; explained that must have Nurse visit for Shingles & Tdap, and can get Flu vaccine at nurse visit if approved by MD, otherwise would need separate appointment during Flu Clinic hours of 2:00pm - 4:30pm. Please advise.

## 2011-02-23 NOTE — Telephone Encounter (Signed)
Spoke to pt and he was transferred to the front desk to schedule an appointment for to receive Tdap booster, shingles vaccine, and flue shot

## 2011-02-24 NOTE — Telephone Encounter (Signed)
He is updated on the Td Please bring patient to get his flu shot As far as the shingles shot, provide him with a Rx , he can get it in the pharmacy

## 2011-03-02 ENCOUNTER — Ambulatory Visit: Payer: No Typology Code available for payment source

## 2011-03-18 ENCOUNTER — Ambulatory Visit (INDEPENDENT_AMBULATORY_CARE_PROVIDER_SITE_OTHER): Payer: No Typology Code available for payment source | Admitting: *Deleted

## 2011-03-18 DIAGNOSIS — Z23 Encounter for immunization: Secondary | ICD-10-CM

## 2011-03-18 DIAGNOSIS — Z0000 Encounter for general adult medical examination without abnormal findings: Secondary | ICD-10-CM

## 2011-03-18 MED ORDER — ZOSTER VACCINE LIVE 19400 UNT/0.65ML ~~LOC~~ SOLR: 0.6500 mL | Freq: Once | SUBCUTANEOUS | Status: AC

## 2011-05-17 ENCOUNTER — Other Ambulatory Visit: Payer: Self-pay | Admitting: Internal Medicine

## 2011-06-11 ENCOUNTER — Encounter: Payer: Self-pay | Admitting: Internal Medicine

## 2011-06-11 ENCOUNTER — Ambulatory Visit (INDEPENDENT_AMBULATORY_CARE_PROVIDER_SITE_OTHER): Payer: No Typology Code available for payment source | Admitting: Internal Medicine

## 2011-06-11 DIAGNOSIS — Z0000 Encounter for general adult medical examination without abnormal findings: Secondary | ICD-10-CM | POA: Insufficient documentation

## 2011-06-11 DIAGNOSIS — M1990 Unspecified osteoarthritis, unspecified site: Secondary | ICD-10-CM

## 2011-06-11 DIAGNOSIS — E119 Type 2 diabetes mellitus without complications: Secondary | ICD-10-CM

## 2011-06-11 DIAGNOSIS — N402 Nodular prostate without lower urinary tract symptoms: Secondary | ICD-10-CM

## 2011-06-11 LAB — CBC WITH DIFFERENTIAL/PLATELET
Basophils Absolute: 0 10*3/uL (ref 0.0–0.1)
Eosinophils Absolute: 0.2 10*3/uL (ref 0.0–0.7)
Hemoglobin: 14.2 g/dL (ref 13.0–17.0)
Lymphocytes Relative: 26.1 % (ref 12.0–46.0)
MCHC: 33.4 g/dL (ref 30.0–36.0)
Monocytes Relative: 8.4 % (ref 3.0–12.0)
Neutro Abs: 4.3 10*3/uL (ref 1.4–7.7)
Neutrophils Relative %: 61.5 % (ref 43.0–77.0)
RDW: 15.4 % — ABNORMAL HIGH (ref 11.5–14.6)

## 2011-06-11 LAB — LIPID PANEL
HDL: 69.8 mg/dL (ref >39.00)
Total CHOL/HDL Ratio: 3
Triglycerides: 62 mg/dL (ref 0.0–149.0)

## 2011-06-11 LAB — COMPREHENSIVE METABOLIC PANEL
ALT: 25 U/L (ref 0–53)
AST: 21 U/L (ref 0–37)
Albumin: 4.1 g/dL (ref 3.5–5.2)
Alkaline Phosphatase: 57 U/L (ref 39–117)
Calcium: 9.7 mg/dL (ref 8.4–10.5)
Chloride: 104 mEq/L (ref 96–112)
Creatinine, Ser: 0.8 mg/dL (ref 0.4–1.5)
Potassium: 4.7 mEq/L (ref 3.5–5.1)

## 2011-06-11 LAB — LDL CHOLESTEROL, DIRECT: Direct LDL: 137.6 mg/dL

## 2011-06-11 LAB — HEMOGLOBIN A1C: Hgb A1c MFr Bld: 6.5 % (ref 4.6–6.5)

## 2011-06-11 LAB — TSH: TSH: 1.07 u[IU]/mL (ref 0.35–5.50)

## 2011-06-11 MED ORDER — HYDROCHLOROTHIAZIDE 25 MG PO TABS: 25.0000 mg | ORAL_TABLET | Freq: Every day | ORAL | Status: DC

## 2011-06-11 MED ORDER — BENAZEPRIL HCL 40 MG PO TABS: 40.0000 mg | ORAL_TABLET | Freq: Every day | ORAL | Status: DC

## 2011-06-11 MED ORDER — OMEPRAZOLE MAGNESIUM 20 MG PO TBEC: 20.0000 mg | DELAYED_RELEASE_TABLET | Freq: Every day | ORAL | Status: DC

## 2011-06-11 NOTE — Assessment & Plan Note (Signed)
Diet controlled, labs 

## 2011-06-11 NOTE — Progress Notes (Signed)
  Subjective:    Patient ID: William Norton, male    DOB: 06/15/1945, 66 y.o.   MRN: [id number]  HPI Complete physical exam  Past Medical History: Hypertension GERD DM Dx 09-2008 A1C 6.1 Osteoarthritis 2011...leg  weakness , pain, ABIs normal ,seen neurology-neurosurgery  Past Surgical History: tonsilectomy Cervical laminectomy knee surgery before, sees Dr. Thomasena Edis Cataract extraction (2005 and 2010) Inguinal herniorrhaphy (12/2008) L hip replacement  6-11  Social History: truck driver Married, 3 children tobacco--  quit 1-11 but smoking again on-off  ETOH-- occasionally drinks beer  diet-- good on-off  Exercise-- sedentary , some walking at work  FH CAD--no Stroke -- M in her late 76 DM-- M, S  HTN-- M S Colon ca-- no Prostate ca--no Throat ca-- F (smoker)  Review of Systems In general doing well, has pain from DJD mostly at the right hip, hoping to get surgery this year. Good  medication compliance, not ambulatory BPs. BP today wnl. Denies chest pain or shortness of breath No nausea, vomiting, diarrhea blood in the stools. No dysuria gross hematuria. His mood is okay, "I always been a little anxious and depressed on and off" but is not a major issue at this point.    Objective:   Physical Exam  Constitutional: He is oriented to person, place, and time. He appears well-developed and well-nourished. No distress.  HENT:  Head: Normocephalic and atraumatic.  Neck: No thyromegaly present.  Cardiovascular: Normal rate, regular rhythm and normal heart sounds.   No murmur heard. Pulmonary/Chest: Effort normal and breath sounds normal. No respiratory distress. He has no wheezes. He has no rales.  Abdominal: Soft. He exhibits no distension. There is no tenderness. There is no rebound.  Musculoskeletal: He exhibits no edema.  Neurological: He is alert and oriented to person, place, and time.  Skin: Skin is warm and dry. He is not diaphoretic.  Psychiatric: He has a  normal mood and affect. His behavior is normal. Judgment and thought content normal.       Assessment & Plan:

## 2011-06-11 NOTE — Assessment & Plan Note (Signed)
Reports he saw urology x 2, was told to RTC 1 year

## 2011-06-11 NOTE — Assessment & Plan Note (Signed)
In need of a R hip replacement, plans to get that done this year, on ibuprofen prn

## 2011-06-11 NOTE — Assessment & Plan Note (Addendum)
Td 2008  pneumonia shot 06-09-10 Shingles immunization discussed again   never had a Cscope  Colonoscopy Vs.iFOB cards reviewed w/ pt. Provided  iFOB but he will  call if he decides to have a  colonoscopy   diet-exercise discussed still smoking, counseled  labs

## 2011-06-12 ENCOUNTER — Encounter: Payer: Self-pay | Admitting: *Deleted

## 2011-06-16 ENCOUNTER — Encounter: Payer: Self-pay | Admitting: Internal Medicine

## 2011-07-01 ENCOUNTER — Other Ambulatory Visit: Payer: No Typology Code available for payment source

## 2011-07-01 DIAGNOSIS — Z1211 Encounter for screening for malignant neoplasm of colon: Secondary | ICD-10-CM

## 2011-07-01 LAB — FECAL OCCULT BLOOD, IMMUNOCHEMICAL: Fecal Occult Bld: NEGATIVE

## 2011-07-06 ENCOUNTER — Telehealth: Payer: Self-pay

## 2011-07-06 ENCOUNTER — Encounter: Payer: Self-pay | Admitting: *Deleted

## 2011-07-06 NOTE — Telephone Encounter (Signed)
Patient called and left message on voicemail that he was returning someone's call and asked that we leave a voicemail if he doesn't answer as he is working.  I returned patients call regarding recent hemoccult test and LMVM that testing was negative and letter mailed to him regarding this if he had any questions to call us back.

## 2011-08-31 ENCOUNTER — Encounter: Payer: Self-pay | Admitting: Internal Medicine

## 2011-08-31 ENCOUNTER — Ambulatory Visit (INDEPENDENT_AMBULATORY_CARE_PROVIDER_SITE_OTHER): Payer: No Typology Code available for payment source | Admitting: Internal Medicine

## 2011-08-31 ENCOUNTER — Encounter (HOSPITAL_COMMUNITY): Payer: Self-pay | Admitting: Pharmacy Technician

## 2011-08-31 VITALS — BP 124/80 | HR 64 | Temp 97.6°F | Wt 204.0 lb

## 2011-08-31 DIAGNOSIS — Z01818 Encounter for other preprocedural examination: Secondary | ICD-10-CM

## 2011-08-31 DIAGNOSIS — M1990 Unspecified osteoarthritis, unspecified site: Secondary | ICD-10-CM

## 2011-08-31 DIAGNOSIS — I10 Essential (primary) hypertension: Secondary | ICD-10-CM

## 2011-08-31 DIAGNOSIS — E119 Type 2 diabetes mellitus without complications: Secondary | ICD-10-CM

## 2011-08-31 DIAGNOSIS — F17200 Nicotine dependence, unspecified, uncomplicated: Secondary | ICD-10-CM

## 2011-08-31 NOTE — Assessment & Plan Note (Signed)
Well controlled 

## 2011-08-31 NOTE — Assessment & Plan Note (Signed)
Counseled about tobacco, his chances to have respiratory complications at the time of her surgery decrease significantly if he quit or at least hold tobacco days in advance. Patient aware

## 2011-08-31 NOTE — Progress Notes (Signed)
  Subjective:    Patient ID: William Norton, male    DOB: 1946/02/13, 66 y.o.   MRN: [id number]  HPI ROV, needs pre-op clearence DJD, needs a hip replacement, right side, scheduled for 09/15/2011 Diabetes, not ambulatory blood sugars Hypertension, good medication compliance, not ambulatory BPs. Tobacco, still smoking, about a pack a day  Past Medical History:  Hypertension  GERD  DM Dx 09-2008 A1C 6.1  Osteoarthritis  2011...leg weakness , pain, ABIs normal ,seen neurology-neurosurgery  Past Surgical History:  tonsilectomy  Cervical laminectomy  knee surgery before, sees Dr. Thomasena Edis  Cataract extraction (2005 and 2010)  Inguinal herniorrhaphy (12/2008)  L hip replacement 6-11  Social History:  truck driver  Married, 3 children  tobacco-- 1 ppd   ETOH-- occasionally drinks beer  diet-- good on-off  Exercise-- sedentary , some walking at work  FH  CAD--no  Stroke -- M in her late 6  DM-- M, S  HTN-- M S  Colon ca-- no  Prostate ca--no  Throat ca-- F (smoker)   Review of Systems No chest pain or shortness of breath; no orthopnea. Patient is active without symptoms, only limited by DJD pain. No nausea, vomiting, diarrhea. Denies snoring or feeling sleepy throughout the day.     Objective:   Physical Exam  General -- alert, well-developed, and well-nourished.   Neck --normal carotid pulses, no JVD Lungs -- normal respiratory effort, no intercostal retractions, no accessory muscle use, and normal breath sounds.   Heart-- normal rate, regular rhythm, no murmur, and no gallop.   Abdomen--soft, non-tender, no distention, no masses, no HSM, no guarding, and no rigidity.   Extremities-- no pretibial edema bilaterally neurologic-- alert & oriented X3 and strength normal in all extremities. Psych-- Cognition and judgment appear intact. Alert and cooperative with normal attention span and concentration.  not anxious appearing and not depressed appearing.        Assessment  & Plan:

## 2011-08-31 NOTE — Assessment & Plan Note (Addendum)
Patient seems to be doing well from the cardiovascular standpoint, no clinical evidence of OSA. EKG done today-- at baseline Patient is clear for surgery. We'll send this note to orthopedic surgery. He will need routine preop labs.

## 2011-08-31 NOTE — Assessment & Plan Note (Signed)
On diet control, last A1c at goal. Labs

## 2011-09-01 ENCOUNTER — Encounter: Payer: Self-pay | Admitting: *Deleted

## 2011-09-03 NOTE — H&P (Signed)
William Norton is an 66 y.o. male.    Chief Complaint: right hip OA and pain   HPI: Pt is a 66 y.o. male complaining of right hip pain for 6-8 months. Pain had continually increased since the beginning. X-rays in the clinic show arthritic changes of the right hip. Pt has tried various conservative treatments which have failed to alleviate their symptoms, including NSAIDs.  Pt has some pain with motion, but really has pain with bearing weight. Feels that he has been loosing strength in the area because he has been avoiding walking for any length of time. Various options are discussed with the patient. Risks, benefits and expectations were discussed with the patient. Patient understand the risks, benefits and expectations and wishes to proceed with surgery.   PCP:  William Ora, MD, MD  D/C Plans:  Home with HHPT  Post-op Meds:  Rx given for ASA, Robaxin, Iron, Colace and MiraLax  Tranexamic Acid:   To be given  Decadron:   To be given  PMH: HTN GERD OA Dentures  PSH: T&A Right knee arthroscopy Cataract Hernia repair Left hip replacement  Social History:  reports that he has been smoking 1.5 PPD.  He does not have any smokeless tobacco history on file. Reports minimal alcohol use.  Allergies:  No Known Allergies  Medications: Current Outpatient Prescriptions  Medication Sig Dispense Refill  . benazepril (LOTENSIN) 40 MG tablet Take 1 tablet (40 mg total) by mouth daily.  90 tablet  2  . glucosamine-chondroitin 500-400 MG tablet Take 1 tablet by mouth daily.      . hydrochlorothiazide (HYDRODIURIL) 25 MG tablet Take 1 tablet (25 mg total) by mouth daily.  90 tablet  2  . ibuprofen (ADVIL,MOTRIN) 200 MG tablet Take 200 mg by mouth every 6 (six) hours as needed. pain      . omeprazole (PRILOSEC OTC) 20 MG tablet Take 1 tablet (20 mg total) by mouth daily.  90 tablet  2  . vitamin B-12 (CYANOCOBALAMIN) 500 MCG tablet Take 500 mcg by mouth daily.        ROS: Review of Systems    Constitutional: Negative.   HENT: Positive for neck pain.   Eyes: Negative.   Respiratory: Negative.   Cardiovascular: Negative.   Gastrointestinal: Negative.   Genitourinary: Negative.   Musculoskeletal: Positive for joint pain.  Skin:       Eczema  Neurological: Negative.   Endo/Heme/Allergies: Negative.   Psychiatric/Behavioral: Negative.      Physical Exam: BP: 160/84 ; HR: 80 ; Resp: 16 Physical Exam  Constitutional: He is oriented to person, place, and time and well-developed, well-nourished, and in no distress.  HENT:  Head: Normocephalic and atraumatic.  Nose: Nose normal.  Mouth/Throat: Oropharynx is clear and moist.  Eyes: Pupils are equal, round, and reactive to light.  Neck: Neck supple. No JVD present. No tracheal deviation present. No thyromegaly present.  Cardiovascular: Normal rate, regular rhythm, normal heart sounds and intact distal pulses.   Pulmonary/Chest: Effort normal and breath sounds normal. No stridor. No respiratory distress. He has no wheezes. He has no rales. He exhibits no tenderness.  Abdominal: Soft. There is no tenderness. There is no guarding.  Lymphadenopathy:    He has no cervical adenopathy.  Neurological: He is alert and oriented to person, place, and time.  Skin: Skin is warm and dry.  Psychiatric: Affect normal.     Assessment/Plan Assessment: right hip OA and pain   Plan: Patient will undergo a  right total hip arthroplasty, anterior approach on 09/15/2011 per Dr. Charlann Boxer at [facility]. Risks benefits and expectation were discussed with the patient. Patient understand risks, benefits and expectation and wishes to proceed.   William Norton William Norton   PAC  09/03/2011, 5:31 PM

## 2011-09-07 ENCOUNTER — Ambulatory Visit (HOSPITAL_COMMUNITY)
Admission: RE | Admit: 2011-09-07 | Discharge: 2011-09-07 | Disposition: A | Discharge status: Home / Self Care | Payer: No Typology Code available for payment source | Source: Ambulatory Visit | Attending: Orthopedic Surgery | Admitting: Orthopedic Surgery

## 2011-09-07 ENCOUNTER — Encounter (HOSPITAL_COMMUNITY): Payer: Self-pay

## 2011-09-07 ENCOUNTER — Encounter (HOSPITAL_COMMUNITY)
Admission: RE | Admit: 2011-09-07 | Discharge: 2011-09-07 | Disposition: A | Discharge status: Home / Self Care | Payer: No Typology Code available for payment source | Source: Ambulatory Visit | Attending: Orthopedic Surgery | Admitting: Orthopedic Surgery

## 2011-09-07 DIAGNOSIS — Z01812 Encounter for preprocedural laboratory examination: Secondary | ICD-10-CM | POA: Insufficient documentation

## 2011-09-07 DIAGNOSIS — M1610 Unilateral primary osteoarthritis, unspecified hip: Secondary | ICD-10-CM | POA: Insufficient documentation

## 2011-09-07 DIAGNOSIS — Z01818 Encounter for other preprocedural examination: Secondary | ICD-10-CM | POA: Insufficient documentation

## 2011-09-07 DIAGNOSIS — M169 Osteoarthritis of hip, unspecified: Secondary | ICD-10-CM | POA: Insufficient documentation

## 2011-09-07 HISTORY — DX: Burn of unspecified body region, unspecified degree: T30.0

## 2011-09-07 HISTORY — DX: Major depressive disorder, single episode, unspecified: F32.9

## 2011-09-07 HISTORY — DX: Essential (primary) hypertension: I10

## 2011-09-07 HISTORY — DX: Gastro-esophageal reflux disease without esophagitis: K21.9

## 2011-09-07 HISTORY — DX: Inflammatory liver disease, unspecified: K75.9

## 2011-09-07 HISTORY — DX: Depression, unspecified: F32.A

## 2011-09-07 LAB — DIFFERENTIAL
Basophils Absolute: 0 10*3/uL (ref 0.0–0.1)
Eosinophils Relative: 4 % (ref 0–5)
Lymphocytes Relative: 34 % (ref 12–46)
Lymphs Abs: 2.2 10*3/uL (ref 0.7–4.0)
Monocytes Absolute: 0.4 10*3/uL (ref 0.1–1.0)
Monocytes Relative: 6 % (ref 3–12)

## 2011-09-07 LAB — URINALYSIS, ROUTINE W REFLEX MICROSCOPIC
Glucose, UA: NEGATIVE mg/dL
Hgb urine dipstick: NEGATIVE
Leukocytes, UA: NEGATIVE
Specific Gravity, Urine: 1.022 (ref 1.005–1.030)
pH: 6 (ref 5.0–8.0)

## 2011-09-07 LAB — BASIC METABOLIC PANEL
CO2: 26 mEq/L (ref 19–32)
Chloride: 100 mEq/L (ref 96–112)
Creatinine, Ser: 0.82 mg/dL (ref 0.50–1.35)
Potassium: 3.4 mEq/L — ABNORMAL LOW (ref 3.5–5.1)

## 2011-09-07 LAB — CBC
HCT: 41.5 % (ref 39.0–52.0)
Hemoglobin: 14.2 g/dL (ref 13.0–17.0)
MCV: 81.7 fL (ref 78.0–100.0)
RDW: 14.4 % (ref 11.5–15.5)
WBC: 6.6 10*3/uL (ref 4.0–10.5)

## 2011-09-07 LAB — SURGICAL PCR SCREEN
MRSA, PCR: NEGATIVE
Staphylococcus aureus: POSITIVE — AB

## 2011-09-07 LAB — APTT: aPTT: 38 seconds — ABNORMAL HIGH (ref 24–37)

## 2011-09-07 MED ORDER — CEFAZOLIN SODIUM 1-5 GM-% IV SOLN: 1.0000 g | INTRAVENOUS | Status: DC
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
## 2011-09-07 NOTE — Patient Instructions (Signed)
20 MASSEY RUHLAND  09/07/2011   Your procedure is scheduled on:  09/15/11 [id number]  Report to [facility] at 0615 AM.  Call this number if you have problems the morning of surgery: [phone]   Remember:   Do not eat food:After Midnight.  May have clear liquids:until Midnight .    Take these medicines the morning of surgery with A SIP OF WATER:    Do not wear jewelry,  Do not wear lotions, powders, or perfumes.   Do not shave 48 hours prior to surgery. Men may shave face and neck.  Do not bring valuables to the hospital.  Contacts, dentures or bridgework may not be worn into surgery.  Leave suitcase in the car. After surgery it may be brought to your room.  For patients admitted to the hospital, checkout time is 11:00 AM the day of discharge.     Special Instructions: CHG Shower Use Special Wash: 1/2 bottle night before surgery and 1/2 bottle morning of surgery.   Please read over the following fact sheets that you were given: MRSA Information, coughing and deep breathing exercises, leg exercises, Blood Transfusion Fact Sheet, Incentive Spirometry Fact Sheet

## 2011-09-07 NOTE — Pre-Procedure Instructions (Signed)
Clearance note on chart from Dr Drue Novel along with recent office visit note of 08/31/11 .  EKG 08/31/11 in EPIC.

## 2011-09-08 NOTE — Pre-Procedure Instructions (Signed)
09/08/11 Pt made aware of time change to 0715am on 09/15/11 and arrival time of 515am.  Pt voiced understanding

## 2011-09-15 ENCOUNTER — Ambulatory Visit (HOSPITAL_COMMUNITY): Payer: No Typology Code available for payment source

## 2011-09-15 ENCOUNTER — Encounter (HOSPITAL_COMMUNITY): Payer: Self-pay | Admitting: Certified Registered Nurse Anesthetist

## 2011-09-15 ENCOUNTER — Ambulatory Visit (HOSPITAL_COMMUNITY): Payer: No Typology Code available for payment source | Admitting: Certified Registered Nurse Anesthetist

## 2011-09-15 ENCOUNTER — Inpatient Hospital Stay (HOSPITAL_COMMUNITY)
Admission: RE | Admit: 2011-09-15 | Discharge: 2011-09-16 | DRG: 470 | Disposition: A | Discharge status: Home Health | Payer: No Typology Code available for payment source | Source: Ambulatory Visit | Attending: Orthopedic Surgery | Admitting: Orthopedic Surgery

## 2011-09-15 ENCOUNTER — Encounter (HOSPITAL_COMMUNITY)
Admission: RE | Disposition: A | Discharge status: Home Health | Payer: Self-pay | Source: Ambulatory Visit | Attending: Orthopedic Surgery

## 2011-09-15 ENCOUNTER — Encounter (HOSPITAL_COMMUNITY): Payer: Self-pay | Admitting: *Deleted

## 2011-09-15 DIAGNOSIS — M169 Osteoarthritis of hip, unspecified: Principal | ICD-10-CM | POA: Diagnosis present

## 2011-09-15 DIAGNOSIS — M1610 Unilateral primary osteoarthritis, unspecified hip: Principal | ICD-10-CM | POA: Diagnosis present

## 2011-09-15 DIAGNOSIS — F17200 Nicotine dependence, unspecified, uncomplicated: Secondary | ICD-10-CM | POA: Diagnosis present

## 2011-09-15 DIAGNOSIS — K219 Gastro-esophageal reflux disease without esophagitis: Secondary | ICD-10-CM | POA: Diagnosis present

## 2011-09-15 DIAGNOSIS — Z96649 Presence of unspecified artificial hip joint: Secondary | ICD-10-CM

## 2011-09-15 HISTORY — PX: TOTAL HIP ARTHROPLASTY: SHX124

## 2011-09-15 LAB — TYPE AND SCREEN
ABO/RH(D): A POS
Antibody Screen: NEGATIVE

## 2011-09-15 SURGERY — ARTHROPLASTY, HIP, TOTAL, ANTERIOR APPROACH
Anesthesia: General | Site: Hip | Laterality: Right | Wound class: Clean

## 2011-09-15 MED ORDER — RIVAROXABAN 10 MG PO TABS
10.0000 mg | ORAL_TABLET | ORAL | Status: DC
  Administered 2011-09-16: 10 mg via ORAL
  Filled 2011-09-15 (×2): qty 1

## 2011-09-15 MED ORDER — PROPOFOL 10 MG/ML IV EMUL
INTRAVENOUS | Status: DC | PRN
  Administered 2011-09-15: 150 mg via INTRAVENOUS

## 2011-09-15 MED ORDER — GLYCOPYRROLATE 0.2 MG/ML IJ SOLN
INTRAMUSCULAR | Status: DC | PRN
  Administered 2011-09-15: .8 mg via INTRAVENOUS

## 2011-09-15 MED ORDER — ACETAMINOPHEN 10 MG/ML IV SOLN
INTRAVENOUS | Status: DC | PRN
  Administered 2011-09-15: 1000 mg via INTRAVENOUS

## 2011-09-15 MED ORDER — EPHEDRINE SULFATE 50 MG/ML IJ SOLN
INTRAMUSCULAR | Status: DC | PRN
  Administered 2011-09-15 (×2): 10 mg via INTRAVENOUS

## 2011-09-15 MED ORDER — NEOSTIGMINE METHYLSULFATE 1 MG/ML IJ SOLN
INTRAMUSCULAR | Status: DC | PRN
  Administered 2011-09-15: 5 mg via INTRAVENOUS

## 2011-09-15 MED ORDER — DOCUSATE SODIUM 100 MG PO CAPS
100.0000 mg | ORAL_CAPSULE | Freq: Two times a day (BID) | ORAL | Status: DC
  Administered 2011-09-15 – 2011-09-16 (×3): 100 mg via ORAL

## 2011-09-15 MED ORDER — METOCLOPRAMIDE HCL 10 MG PO TABS: 5.0000 mg | ORAL_TABLET | Freq: Three times a day (TID) | ORAL | Status: DC | PRN

## 2011-09-15 MED ORDER — DEXAMETHASONE SODIUM PHOSPHATE 10 MG/ML IJ SOLN
INTRAMUSCULAR | Status: DC | PRN
  Administered 2011-09-15: 10 mg via INTRAVENOUS

## 2011-09-15 MED ORDER — MIDAZOLAM HCL 5 MG/5ML IJ SOLN
INTRAMUSCULAR | Status: DC | PRN
  Administered 2011-09-15: 2 mg via INTRAVENOUS

## 2011-09-15 MED ORDER — HYDROMORPHONE HCL PF 1 MG/ML IJ SOLN
INTRAMUSCULAR | Status: AC
  Filled 2011-09-15: qty 1

## 2011-09-15 MED ORDER — METHOCARBAMOL 100 MG/ML IJ SOLN
500.0000 mg | Freq: Four times a day (QID) | INTRAVENOUS | Status: DC | PRN
  Filled 2011-09-15: qty 5

## 2011-09-15 MED ORDER — PROMETHAZINE HCL 25 MG/ML IJ SOLN: 6.2500 mg | INTRAMUSCULAR | Status: DC | PRN

## 2011-09-15 MED ORDER — ROCURONIUM BROMIDE 100 MG/10ML IV SOLN
INTRAVENOUS | Status: DC | PRN
  Administered 2011-09-15: 50 mg via INTRAVENOUS
  Administered 2011-09-15: 10 mg via INTRAVENOUS

## 2011-09-15 MED ORDER — BISACODYL 5 MG PO TBEC: 5.0000 mg | DELAYED_RELEASE_TABLET | Freq: Every day | ORAL | Status: DC | PRN

## 2011-09-15 MED ORDER — HYDROMORPHONE HCL PF 1 MG/ML IJ SOLN
INTRAMUSCULAR | Status: DC | PRN
  Administered 2011-09-15 (×2): 1 mg via INTRAVENOUS

## 2011-09-15 MED ORDER — HYDROCODONE-ACETAMINOPHEN 7.5-325 MG PO TABS
1.0000 | ORAL_TABLET | ORAL | Status: DC
  Administered 2011-09-15: 2 via ORAL
  Administered 2011-09-15 (×2): 1 via ORAL
  Administered 2011-09-16 (×3): 2 via ORAL
  Filled 2011-09-15 (×2): qty 2
  Filled 2011-09-15: qty 1
  Filled 2011-09-15 (×2): qty 2
  Filled 2011-09-15: qty 1

## 2011-09-15 MED ORDER — ONDANSETRON HCL 4 MG/2ML IJ SOLN
INTRAMUSCULAR | Status: DC | PRN
  Administered 2011-09-15: 4 mg via INTRAVENOUS

## 2011-09-15 MED ORDER — CEFAZOLIN SODIUM 1-5 GM-% IV SOLN
INTRAVENOUS | Status: DC | PRN
  Administered 2011-09-15: 2 g via INTRAVENOUS

## 2011-09-15 MED ORDER — CEFAZOLIN SODIUM-DEXTROSE 2-3 GM-% IV SOLR
2.0000 g | Freq: Four times a day (QID) | INTRAVENOUS | Status: AC
  Administered 2011-09-15 – 2011-09-16 (×3): 2 g via INTRAVENOUS
  Filled 2011-09-15 (×3): qty 50

## 2011-09-15 MED ORDER — ALUM & MAG HYDROXIDE-SIMETH 200-200-20 MG/5ML PO SUSP
30.0000 mL | ORAL | Status: DC | PRN
  Administered 2011-09-16: 30 mL via ORAL
  Filled 2011-09-15: qty 30

## 2011-09-15 MED ORDER — HYDROMORPHONE HCL PF 1 MG/ML IJ SOLN
0.2500 mg | INTRAMUSCULAR | Status: DC | PRN
  Administered 2011-09-15 (×3): 0.5 mg via INTRAVENOUS

## 2011-09-15 MED ORDER — DIPHENHYDRAMINE HCL 25 MG PO CAPS: 25.0000 mg | ORAL_CAPSULE | Freq: Four times a day (QID) | ORAL | Status: DC | PRN

## 2011-09-15 MED ORDER — PHENOL 1.4 % MT LIQD: 1.0000 | OROMUCOSAL | Status: DC | PRN

## 2011-09-15 MED ORDER — SODIUM CHLORIDE 0.9 % IV SOLN
100.0000 mL/h | INTRAVENOUS | Status: DC
  Administered 2011-09-15 – 2011-09-16 (×2): 100 mL/h via INTRAVENOUS
  Filled 2011-09-15 (×5): qty 1000

## 2011-09-15 MED ORDER — ZOLPIDEM TARTRATE 5 MG PO TABS: 5.0000 mg | ORAL_TABLET | Freq: Every evening | ORAL | Status: DC | PRN

## 2011-09-15 MED ORDER — LACTATED RINGERS IV SOLN
INTRAVENOUS | Status: DC | PRN
  Administered 2011-09-15: 08:00:00 via INTRAVENOUS
  Administered 2011-09-15: 1000 mL

## 2011-09-15 MED ORDER — OMEPRAZOLE MAGNESIUM 20 MG PO TBEC: 20.0000 mg | DELAYED_RELEASE_TABLET | Freq: Every day | ORAL | Status: DC

## 2011-09-15 MED ORDER — CEFAZOLIN SODIUM-DEXTROSE 2-3 GM-% IV SOLR
INTRAVENOUS | Status: AC
  Filled 2011-09-15: qty 50

## 2011-09-15 MED ORDER — MENTHOL 3 MG MT LOZG: 1.0000 | LOZENGE | OROMUCOSAL | Status: DC | PRN

## 2011-09-15 MED ORDER — HYDROMORPHONE HCL PF 1 MG/ML IJ SOLN: 0.5000 mg | INTRAMUSCULAR | Status: DC | PRN

## 2011-09-15 MED ORDER — METOCLOPRAMIDE HCL 5 MG/ML IJ SOLN: 5.0000 mg | Freq: Three times a day (TID) | INTRAMUSCULAR | Status: DC | PRN

## 2011-09-15 MED ORDER — ONDANSETRON HCL 4 MG/2ML IJ SOLN
4.0000 mg | Freq: Four times a day (QID) | INTRAMUSCULAR | Status: DC | PRN
  Administered 2011-09-15: 4 mg via INTRAVENOUS
  Filled 2011-09-15: qty 2

## 2011-09-15 MED ORDER — OMEPRAZOLE 20 MG PO CPDR
20.0000 mg | DELAYED_RELEASE_CAPSULE | Freq: Every day | ORAL | Status: DC
  Administered 2011-09-15 – 2011-09-16 (×2): 20 mg via ORAL
  Filled 2011-09-15 (×2): qty 1

## 2011-09-15 MED ORDER — FENTANYL CITRATE 0.05 MG/ML IJ SOLN
INTRAMUSCULAR | Status: DC | PRN
  Administered 2011-09-15: 50 ug via INTRAVENOUS
  Administered 2011-09-15: 100 ug via INTRAVENOUS
  Administered 2011-09-15 (×2): 50 ug via INTRAVENOUS

## 2011-09-15 MED ORDER — CHLORHEXIDINE GLUCONATE 4 % EX LIQD
60.0000 mL | Freq: Once | CUTANEOUS | Status: DC
  Filled 2011-09-15: qty 60

## 2011-09-15 MED ORDER — POLYETHYLENE GLYCOL 3350 17 G PO PACK
17.0000 g | PACK | Freq: Two times a day (BID) | ORAL | Status: DC
  Administered 2011-09-15 – 2011-09-16 (×3): 17 g via ORAL

## 2011-09-15 MED ORDER — FLEET ENEMA 7-19 GM/118ML RE ENEM: 1.0000 | ENEMA | Freq: Once | RECTAL | Status: AC | PRN

## 2011-09-15 MED ORDER — HYDROCHLOROTHIAZIDE 25 MG PO TABS
25.0000 mg | ORAL_TABLET | Freq: Every day | ORAL | Status: DC
  Administered 2011-09-15 – 2011-09-16 (×2): 25 mg via ORAL
  Filled 2011-09-15 (×2): qty 1

## 2011-09-15 MED ORDER — TRANEXAMIC ACID 100 MG/ML IV SOLN
15.0000 mg/kg | Freq: Once | INTRAVENOUS | Status: AC
  Administered 2011-09-15: 1383 mg via INTRAVENOUS
  Filled 2011-09-15: qty 13.83

## 2011-09-15 MED ORDER — FERROUS SULFATE 325 (65 FE) MG PO TABS
325.0000 mg | ORAL_TABLET | Freq: Three times a day (TID) | ORAL | Status: DC
  Administered 2011-09-15 – 2011-09-16 (×2): 325 mg via ORAL
  Filled 2011-09-15 (×5): qty 1

## 2011-09-15 MED ORDER — ONDANSETRON HCL 4 MG PO TABS: 4.0000 mg | ORAL_TABLET | Freq: Four times a day (QID) | ORAL | Status: DC | PRN

## 2011-09-15 MED ORDER — METHOCARBAMOL 500 MG PO TABS: 500.0000 mg | ORAL_TABLET | Freq: Four times a day (QID) | ORAL | Status: DC | PRN

## 2011-09-15 MED ORDER — LIDOCAINE HCL (CARDIAC) 20 MG/ML IV SOLN
INTRAVENOUS | Status: DC | PRN
  Administered 2011-09-15: 100 mg via INTRAVENOUS

## 2011-09-15 MED ORDER — HETASTARCH-ELECTROLYTES 6 % IV SOLN
INTRAVENOUS | Status: DC | PRN
  Administered 2011-09-15: 10:00:00 via INTRAVENOUS

## 2011-09-15 MED ORDER — ACETAMINOPHEN 10 MG/ML IV SOLN
INTRAVENOUS | Status: AC
  Filled 2011-09-15: qty 100

## 2011-09-15 MED ORDER — DEXAMETHASONE SODIUM PHOSPHATE 10 MG/ML IJ SOLN
10.0000 mg | Freq: Once | INTRAMUSCULAR | Status: AC
  Administered 2011-09-16: 10 mg via INTRAVENOUS
  Filled 2011-09-15: qty 1

## 2011-09-15 MED ORDER — 0.9 % SODIUM CHLORIDE (POUR BTL) OPTIME
TOPICAL | Status: DC | PRN
  Administered 2011-09-15: 1000 mL

## 2011-09-15 SURGICAL SUPPLY — 41 items
ADH SKN CLS APL DERMABOND .7 (GAUZE/BANDAGES/DRESSINGS) ×1
BAG SPEC THK2 15X12 ZIP CLS (MISCELLANEOUS) ×1
BAG ZIPLOCK 12X15 (MISCELLANEOUS) ×2 IMPLANT
BLADE SAW SGTL 18X1.27X75 (BLADE) ×2 IMPLANT
CELLS DAT CNTRL 66122 CELL SVR (MISCELLANEOUS) IMPLANT
CLOTH BEACON ORANGE TIMEOUT ST (SAFETY) ×2 IMPLANT
DERMABOND ADVANCED (GAUZE/BANDAGES/DRESSINGS) ×1
DERMABOND ADVANCED .7 DNX12 (GAUZE/BANDAGES/DRESSINGS) ×1 IMPLANT
DRAPE C-ARM 42X72 X-RAY (DRAPES) ×2 IMPLANT
DRAPE STERI IOBAN 125X83 (DRAPES) ×2 IMPLANT
DRAPE U-SHAPE 47X51 STRL (DRAPES) ×6 IMPLANT
DRSG AQUACEL AG ADV 3.5X10 (GAUZE/BANDAGES/DRESSINGS) ×2 IMPLANT
DRSG TEGADERM 4X4.75 (GAUZE/BANDAGES/DRESSINGS) IMPLANT
DURAPREP 26ML APPLICATOR (WOUND CARE) ×2 IMPLANT
ELECT BLADE TIP CTD 4 INCH (ELECTRODE) ×2 IMPLANT
ELECT REM PT RETURN 9FT ADLT (ELECTROSURGICAL) ×2
ELECTRODE REM PT RTRN 9FT ADLT (ELECTROSURGICAL) ×1 IMPLANT
EVACUATOR 1/8 PVC DRAIN (DRAIN) ×1 IMPLANT
FACESHIELD LNG OPTICON STERILE (SAFETY) ×8 IMPLANT
GAUZE SPONGE 2X2 8PLY STRL LF (GAUZE/BANDAGES/DRESSINGS) ×1 IMPLANT
GLOVE BIOGEL PI IND STRL 7.5 (GLOVE) ×1 IMPLANT
GLOVE BIOGEL PI IND STRL 8 (GLOVE) ×1 IMPLANT
GLOVE BIOGEL PI INDICATOR 7.5 (GLOVE) ×1
GLOVE BIOGEL PI INDICATOR 8 (GLOVE) ×1
GLOVE ECLIPSE 8.0 STRL XLNG CF (GLOVE) ×2 IMPLANT
GLOVE ORTHO TXT STRL SZ7.5 (GLOVE) ×4 IMPLANT
GOWN BRE IMP PREV XXLGXLNG (GOWN DISPOSABLE) ×4 IMPLANT
GOWN STRL NON-REIN LRG LVL3 (GOWN DISPOSABLE) ×2 IMPLANT
KIT BASIN OR (CUSTOM PROCEDURE TRAY) ×2 IMPLANT
PACK TOTAL JOINT (CUSTOM PROCEDURE TRAY) ×2 IMPLANT
PADDING CAST COTTON 6X4 STRL (CAST SUPPLIES) ×2 IMPLANT
RETRACTOR WND ALEXIS 18 MED (MISCELLANEOUS) ×1 IMPLANT
RTRCTR WOUND ALEXIS 18CM MED (MISCELLANEOUS)
SPONGE GAUZE 2X2 STER 10/PKG (GAUZE/BANDAGES/DRESSINGS) ×1
SUCTION FRAZIER 12FR DISP (SUCTIONS) ×2 IMPLANT
SUT MNCRL AB 4-0 PS2 18 (SUTURE) ×2 IMPLANT
SUT VIC AB 1 CT1 36 (SUTURE) ×6 IMPLANT
SUT VIC AB 2-0 CT1 27 (SUTURE) ×4
SUT VIC AB 2-0 CT1 TAPERPNT 27 (SUTURE) ×2 IMPLANT
TOWEL OR 17X26 10 PK STRL BLUE (TOWEL DISPOSABLE) ×4 IMPLANT
TRAY FOLEY CATH 14FRSI W/METER (CATHETERS) ×2 IMPLANT

## 2011-09-15 NOTE — Interval H&P Note (Signed)
History and Physical Interval Note:  09/15/2011 7:14 AM  William Norton  has presented today for surgery, with the diagnosis of Osteoarthritis of the Right Hip  The various methods of treatment have been discussed with the patient and family. After consideration of risks, benefits and other options for treatment, the patient has consented to  Procedure(s) (LRB): RIGHT TOTAL HIP ARTHROPLASTY ANTERIOR APPROACH (Right) as a surgical intervention .  The patients' history has been reviewed, patient examined, no change in status, stable for surgery.  I have reviewed the patients' chart and labs.  Questions were answered to the patient's satisfaction.     Shelda Pal

## 2011-09-15 NOTE — Progress Notes (Signed)
Utilization review completed.  

## 2011-09-15 NOTE — Anesthesia Postprocedure Evaluation (Signed)
  Anesthesia Post-op Note  Patient: William Norton  Procedure(s) Performed: Procedure(s) (LRB): TOTAL HIP ARTHROPLASTY ANTERIOR APPROACH (Right)  Patient Location: PACU  Anesthesia Type: General  Level of Consciousness: awake and alert   Airway and Oxygen Therapy: Patient Spontanous Breathing  Post-op Pain: mild  Post-op Assessment: Post-op Vital signs reviewed, Patient's Cardiovascular Status Stable, Respiratory Function Stable, Patent Airway and No signs of Nausea or vomiting  Post-op Vital Signs: stable  Complications: No apparent anesthesia complications

## 2011-09-15 NOTE — Transfer of Care (Signed)
Immediate Anesthesia Transfer of Care Note  Patient: William Norton  Procedure(s) Performed: Procedure(s) (LRB): TOTAL HIP ARTHROPLASTY ANTERIOR APPROACH (Right)  Patient Location: PACU  Anesthesia Type: General  Level of Consciousness: sedated, patient cooperative and responds to stimulaton  Airway & Oxygen Therapy: Patient Spontanous Breathing and Patient connected to face mask oxgen  Post-op Assessment: Report given to PACU RN and Post -op Vital signs reviewed and stable  Post vital signs: Reviewed and stable  Complications: No apparent anesthesia complications

## 2011-09-15 NOTE — Op Note (Signed)
NAME:  William Norton                ACCOUNT NO.: [id number]      MEDICAL RECORD NO.: [id number]      FACILITY:  [facility]      PHYSICIAN:  Durene Romans D  DATE OF BIRTH:  May 27, 1945     DATE OF PROCEDURE:  09/15/2011                                 OPERATIVE REPORT         PREOPERATIVE DIAGNOSIS: Right  hip osteoarthritis.      POSTOPERATIVE DIAGNOSIS:  Right hip osteoarthritis.      PROCEDURE:  Right total hip replacement through an anterior approach   utilizing DePuy THR system, component size 56mm pinnacle cup, a size 36+4 neutral   Altrex liner, a size 5 Tri Lock stem with a 36+5 delta ceramic   ball.      SURGEON:  Madlyn Frankel. Charlann Boxer, M.D.      ASSISTANT:  Lanney Gins, PA      ANESTHESIA:  General.      SPECIMENS:  None.      COMPLICATIONS:  None.      BLOOD LOSS:  550 cc     DRAINS:  One Hemovac.      INDICATION OF THE PROCEDURE:  William Norton is a 66 y.o. male who had   presented to office for evaluation of right hip pain.  Radiographs revealed   progressive degenerative changes with bone-on-bone   articulation to the  hip joint.  The patient had painful limited range of   motion significantly affecting their overall quality of life.  The patient was failing to    respond to conservative measures, and at this point was ready   to proceed with more definitive measures.  The patient has noted progressive   degenerative changes in his hip, progressive problems and dysfunction   with regarding the hip prior to surgery.  Consent was obtained for   benefit of pain relief.  Specific risk of infection, DVT, component   failure, dislocation, need for revision surgery, as well discussion of   the anterior versus posterior approach were reviewed.  Consent was   obtained for benefit of anterior pain relief through an anterior   approach.      PROCEDURE IN DETAIL:  The patient was brought to operative theater.   Once adequate anesthesia, preoperative antibiotics, 2gm Ancef  administered.   The patient was positioned supine on the OSI Hanna table.  Once adequate   padding of boney process was carried out, we had predraped out the hip, and  used fluoroscopy to confirm orientation of the pelvis and position.      The right hip was then prepped and draped from proximal iliac crest to   mid thigh with shower curtain technique.      Time-out was performed identifying the patient, planned procedure, and   extremity.     An incision was then made 2 cm distal and lateral to the   anterior superior iliac spine extending over the orientation of the   tensor fascia lata muscle and sharp dissection was carried down to the   fascia of the muscle and protractor placed in the soft tissues.      The fascia was then incised.  The muscle belly was identified and swept   laterally  and retractor placed along the superior neck.  Following   cauterization of the circumflex vessels and removing some pericapsular   fat, a second cobra retractor was placed on the inferior neck.  A third   retractor was placed on the anterior acetabulum after elevating the   anterior rectus.  A L-capsulotomy was along the line of the   superior neck to the trochanteric fossa, then extended proximally and   distally.  Tag sutures were placed and the retractors were then placed   intracapsular.  We then identified the trochanteric fossa and   orientation of my neck cut, confirmed this radiographically   and then made a neck osteotomy with the femur on traction.  The femoral   head was removed without difficulty or complication.  Traction was let   off and retractors were placed posterior and anterior around the   acetabulum.      The labrum and foveal tissue were debrided.  I began reaming with a 49mm   reamer and reamed up to 55mm reamer with good bony bed preparation and a 56   cup was chosen.  The final 56mm Pinnacle cup was then impacted under fluoroscopy  to confirm the depth of penetration  and orientation with respect to   abduction.  A screw was placed followed by the hole eliminator.  The final   36+4 Altrex liner was impacted with good visualized rim fit.  The cup was positioned anatomically within the acetabular portion of the pelvis.      At this point, the femur was rolled at 80 degrees.  Further capsule was   released off the inferior aspect of the femoral neck.  I then   released the superior capsule proximally.  The hook was placed laterally   along the femur and elevated manually and held in position with the bed   hook.  The leg was then extended and adducted with the leg rolled to 100   degrees of external rotation.  Once the proximal femur was fully   exposed, I used a box osteotome to set orientation.  I then began   broaching with the starting chili pepper broach and passed this by hand and then broached up to 5.  With the 5 broach in place I chose a high offset neck and did a trial reduction with a 36+5 ball to match the contralateral hip.  The offset was appropriate, leg lengths   appeared to be equal, confirmed radiographically.   Given these findings, I went ahead and dislocated the hip, repositioned all   retractors and positioned the right hip in the extended and abducted position.  The final 5 high offset Tri Lock stem was   chosen and it was impacted down to the level of neck cut.  Based on this   and the trial reduction, a 36+5 delta ceramic ball was chosen and   impacted onto a clean and dry trunnion, and the hip was reduced.  The   hip had been irrigated throughout the case again at this point.  I did   reapproximate the superior capsular leaflet to the anterior leaflet   using #1 Vicryl, placed a medium Hemovac drain deep.  The fascia of the   tensor fascia lata muscle was then reapproximated using #1 Vicryl.  The   remaining wound was closed with 2-0 Vicryl and running 4-0 Monocryl.   The hip was cleaned, dried, and dressed sterilely using Dermabond  and   Aquacel dressing.  Drain site dressed separately.  She was then brought   to recovery room in stable condition tolerating the procedure well.    Lanney Gins, PA-C was present for the entirety of the case involved from   preoperative positioning, perioperative retractor management, general   facilitation of the case, as well as primary wound closure as assistant.            Madlyn Frankel Charlann Boxer, M.D.            MDO/MEDQ  D:  02/17/2011  T:  02/17/2011  Job:  [id number]      Electronically Signed by Durene Romans M.D. on 02/23/2011 09:15:38 AM

## 2011-09-15 NOTE — Anesthesia Preprocedure Evaluation (Addendum)
Anesthesia Evaluation  Patient identified by MRN, date of birth, ID band Patient awake    Reviewed: Allergy & Precautions, H&P , NPO status , Patient's Chart, lab work & pertinent test results  Airway Mallampati: II TM Distance: >3 FB Neck ROM: Full    Dental No notable dental hx. (+) Caps   Pulmonary pneumonia , Current Smoker,  CXR: NAD breath sounds clear to auscultation  Pulmonary exam normal       Cardiovascular Exercise Tolerance: Good hypertension, Pt. on medications Rhythm:Regular Rate:Normal  ECG: SR frequent PACs. Clearance Dr. Drue Novel   Neuro/Psych negative neurological ROS  negative psych ROS   GI/Hepatic GERD-  Medicated,(+) Hepatitis -, Unspecified  Endo/Other  Diabetes mellitus-, Type 2Patient denies diabetes, but says they tell him he has diabetes. A1C within goal per Dr. Leta Jungling note  Renal/GU negative Renal ROS  negative genitourinary   Musculoskeletal negative musculoskeletal ROS (+)   Abdominal   Peds negative pediatric ROS (+)  Hematology negative hematology ROS (+)   Anesthesia Other Findings   Reproductive/Obstetrics negative OB ROS                         Anesthesia Physical Anesthesia Plan  ASA: II  Anesthesia Plan: General   Post-op Pain Management:    Induction: Intravenous  Airway Management Planned: Oral ETT  Additional Equipment:   Intra-op Plan:   Post-operative Plan: Extubation in OR  Informed Consent: I have reviewed the patients History and Physical, chart, labs and discussed the procedure including the risks, benefits and alternatives for the proposed anesthesia with the patient or authorized representative who has indicated his/her understanding and acceptance.   Dental advisory given  Plan Discussed with: CRNA  Anesthesia Plan Comments: (Prefers general.)        Anesthesia Quick Evaluation

## 2011-09-16 LAB — CBC
MCH: 27.6 pg (ref 26.0–34.0)
Platelets: 235 10*3/uL (ref 150–400)
RBC: 3.99 MIL/uL — ABNORMAL LOW (ref 4.22–5.81)
WBC: 9.5 10*3/uL (ref 4.0–10.5)

## 2011-09-16 LAB — BASIC METABOLIC PANEL
CO2: 23 mEq/L (ref 19–32)
Calcium: 8.4 mg/dL (ref 8.4–10.5)
Chloride: 100 mEq/L (ref 96–112)
GFR calc Af Amer: >90 mL/min (ref >90)
Sodium: 133 mEq/L — ABNORMAL LOW (ref 135–145)

## 2011-09-16 MED ORDER — POLYETHYLENE GLYCOL 3350 17 G PO PACK: 17.0000 g | PACK | Freq: Two times a day (BID) | ORAL | Status: AC

## 2011-09-16 MED ORDER — DSS 100 MG PO CAPS: 100.0000 mg | ORAL_CAPSULE | Freq: Two times a day (BID) | ORAL | Status: AC

## 2011-09-16 MED ORDER — ASPIRIN EC 325 MG PO TBEC: 325.0000 mg | DELAYED_RELEASE_TABLET | Freq: Two times a day (BID) | ORAL | Status: AC

## 2011-09-16 MED ORDER — DIPHENHYDRAMINE HCL 25 MG PO CAPS: 25.0000 mg | ORAL_CAPSULE | Freq: Four times a day (QID) | ORAL | Status: DC | PRN

## 2011-09-16 MED ORDER — METHOCARBAMOL 500 MG PO TABS: 500.0000 mg | ORAL_TABLET | Freq: Four times a day (QID) | ORAL | Status: AC | PRN

## 2011-09-16 MED ORDER — HYDROCODONE-ACETAMINOPHEN 7.5-325 MG PO TABS: 1.0000 | ORAL_TABLET | ORAL | Status: AC | PRN

## 2011-09-16 MED ORDER — FERROUS SULFATE 325 (65 FE) MG PO TABS: 325.0000 mg | ORAL_TABLET | Freq: Three times a day (TID) | ORAL | Status: DC

## 2011-09-16 NOTE — Progress Notes (Signed)
Occupational Therapy Note Chart reviewed. Spoke to patient and he reports that he has had a hip surgery in the past and doesn't feel he needs to practice any of ADL before discharge home. He does need a new 3in1 per his report as he no longer has the one from previous surgery. He declines need to practice with 3in1. Will sign off for OT Judithann Sauger OTR/L [phone] 09/16/2011

## 2011-09-16 NOTE — Discharge Summary (Signed)
Physician Discharge Summary  Patient ID: William Norton MRN: [id number] DOB/AGE: December 29, 1945 66 y.o.  Admit date: 09/15/2011 Discharge date: 09/16/2011  Procedures:  Procedure(s) (LRB): TOTAL HIP ARTHROPLASTY ANTERIOR APPROACH (Right)  Attending Physician:  Dr. Durene Romans   Admission Diagnoses: Right hip OA and pain  Discharge Diagnoses:  Principal Problem:  *S/P right THA, AA HTN  GERD  OA   HPI: Pt is a 66 y.o. male complaining of right hip pain for 6-8 months. Pain had continually increased since the beginning. X-rays in the clinic show arthritic changes of the right hip. Pt has tried various conservative treatments which have failed to alleviate their symptoms, including NSAIDs. Pt has some pain with motion, but really has pain with bearing weight. Feels that he has been loosing strength in the area because he has been avoiding walking for any length of time. Various options are discussed with the patient. Risks, benefits and expectations were discussed with the patient. Patient understand the risks, benefits and expectations and wishes to proceed with surgery.   PCP: Willow Ora, MD, MD   Discharged Condition: good  Hospital Course:  Patient underwent the above stated procedure on 09/15/2011. Patient tolerated the procedure well and brought to the recovery room in good condition and subsequently to the floor.  POD #1 BP: 125/74 ; Pulse: 65 ; Temp: 97.9 F (36.6 C) ; Resp: 16  Pt's foley was removed, as well as the hemovac drain removed. IV was changed to a saline lock. Patient reports pain as mild, pain well controlled. No events throughout the night. Ready to be discharged home. Neurovascular intact, dorsiflexion/plantar flexion intact, incision: dressing C/D/I, no cellulitis present and compartment soft.   LABS  Basename  09/16/11 0500   HGB  11.0  HCT  32.6    Discharge Exam: General appearance: alert, cooperative and no distress Extremities: Homans sign is negative,  no sign of DVT, no edema, redness or tenderness in the calves or thighs and no ulcers, gangrene or trophic changes  Disposition:  Home with follow up in 2 weeks  Follow-up Information    Follow up with OLIN,Daimen Shovlin D in 2 weeks.   Contact information:   [facility] [address] [place] [address] [phone]          Discharge Orders    Future Appointments: Provider: Department: Dept Phone: Center:   12/10/2011 8:00 AM Wanda Plump, MD [facility] [phone] [facility]   01/01/2012 9:00 AM Wanda Plump, MD [facility] [phone] [facility]     Future Orders Please Complete By Expires   Diet - low sodium heart healthy      Call MD / Call 911      Comments:   If you experience chest pain or shortness of breath, CALL 911 and be transported to the hospital emergency room.  If you develope a fever above 101 F, pus (white drainage) or increased drainage or redness at the wound, or calf pain, call your surgeon's office.   Discharge instructions      Comments:   Maintain surgical dressing for 8 days, then replace with gauze and tape. Keep the area dry and clean until follow up. Follow up in 2 weeks at [facility]. Call with any questions or concerns.     Constipation Prevention      Comments:   Drink plenty of fluids.  Prune juice may be helpful.  You may use a stool softener, such as Colace (over the counter) 100  mg twice a day.  Use MiraLax (over the counter) for constipation as needed.   Increase activity slowly as tolerated      Driving restrictions      Comments:   No driving for 4 weeks   Change dressing      Comments:   Maintain surgical dressing for 8 days, then replace with 4x4 guaze and tape. Keep the area dry and clean.   TED hose      Comments:   Use stockings (TED hose) for 2 weeks on both leg(s).  You may remove them at night for sleeping.   Weight bearing as tolerated         Current Discharge Medication  List    START taking these medications   Details  aspirin EC 325 MG tablet Take 1 tablet (325 mg total) by mouth 2 (two) times daily. X 4 weeks Qty: 60 tablet, Refills: 0    diphenhydrAMINE (BENADRYL) 25 mg capsule Take 1 capsule (25 mg total) by mouth every 6 (six) hours as needed for itching, allergies or sleep.    docusate sodium 100 MG CAPS Take 100 mg by mouth 2 (two) times daily.    ferrous sulfate 325 (65 FE) MG tablet Take 1 tablet (325 mg total) by mouth 3 (three) times daily after meals.    HYDROcodone-acetaminophen (NORCO) 7.5-325 MG per tablet Take 1-2 tablets by mouth every 4 (four) hours as needed for pain. Qty: 120 tablet, Refills: 0    methocarbamol (ROBAXIN) 500 MG tablet Take 1 tablet (500 mg total) by mouth every 6 (six) hours as needed (muscle spasms).    polyethylene glycol (MIRALAX / GLYCOLAX) packet Take 17 g by mouth 2 (two) times daily.      CONTINUE these medications which have NOT CHANGED   Details  benazepril (LOTENSIN) 40 MG tablet Take 1 tablet (40 mg total) by mouth daily. Qty: 90 tablet, Refills: 2    glucosamine-chondroitin 500-400 MG tablet Take 1 tablet by mouth daily.    hydrochlorothiazide (HYDRODIURIL) 25 MG tablet Take 1 tablet (25 mg total) by mouth daily. Qty: 90 tablet, Refills: 2    omeprazole (PRILOSEC OTC) 20 MG tablet Take 1 tablet (20 mg total) by mouth daily. Qty: 90 tablet, Refills: 2    vitamin B-12 (CYANOCOBALAMIN) 500 MCG tablet Take 500 mcg by mouth daily.      STOP taking these medications     ibuprofen (ADVIL,MOTRIN) 200 MG tablet Comments:  Reason for Stopping:            Signed: Anastasio Auerbach. Jonai Weyland   PAC  09/16/2011, 9:10 AM

## 2011-09-16 NOTE — Progress Notes (Signed)
Physical Therapy Treatment Patient Details Name: William Norton MRN: [id number] DOB: 06-29-1945 Today's Date: 09/16/2011 Time: 4098-1191 PT Time Calculation (min): 19 min  PT Assessment / Plan / Recommendation Comments on Treatment Session  reviewed car transfers with pt and spouse    Follow Up Recommendations  Home health PT    Barriers to Discharge        Equipment Recommendations  3 in 1 bedside comode    Recommendations for Other Services OT consult  Frequency 7X/week   Plan Discharge plan remains appropriate    Precautions / Restrictions Restrictions Weight Bearing Restrictions: No Other Position/Activity Restrictions: WBAT   Pertinent Vitals/Pain Pt with min c/o pain    Mobility  Transfers Transfers: Sit to Stand;Stand to Sit Sit to Stand: 4: Min guard Stand to Sit: 4: Min guard Details for Transfer Assistance: cues for LE management and use of UEs to self assist Ambulation/Gait Ambulation/Gait Assistance: 4: Min guard;5: Supervision Ambulation Distance (Feet): 180 Feet Assistive device: Rolling walker Ambulation/Gait Assistance Details: cues for posture and position from RW Gait Pattern: Step-to pattern;Step-through pattern Stairs: Yes Stairs Assistance: 4: Min assist Stairs Assistance Details (indicate cue type and reason): cues for sequence and foot/RW placement - up 1 fwd and 1 bkwd Stair Management Technique: No rails;With walker;Backwards;Forwards Number of Stairs: 1     Exercises     PT Diagnosis:    PT Problem List:   PT Treatment Interventions:     PT Goals Acute Rehab PT Goals PT Goal Formulation: With patient Time For Goal Achievement: 09/19/11 Potential to Achieve Goals: Good Pt will go Supine/Side to Sit: with supervision PT Goal: Supine/Side to Sit - Progress: Partly met Pt will go Sit to Supine/Side: with supervision PT Goal: Sit to Supine/Side - Progress: Partly met Pt will go Sit to Stand: with supervision PT Goal: Sit to Stand -  Progress: Progressing toward goal Pt will go Stand to Sit: with supervision PT Goal: Stand to Sit - Progress: Progressing toward goal Pt will Ambulate: >150 feet;with supervision;with rolling walker PT Goal: Ambulate - Progress: Progressing toward goal Pt will Go Up / Down Stairs: 1-2 stairs;with min assist;with rolling walker PT Goal: Up/Down Stairs - Progress: Met  Visit Information  Last PT Received On: 09/16/11 Assistance Needed: +1    Subjective Data  Subjective: I'm not really having pain - its just sore Patient Stated Goal: Resume previous lifestyle with decreased pain   Cognition  Overall Cognitive Status: Appears within functional limits for tasks assessed/performed Arousal/Alertness: Awake/alert Orientation Level: Appears intact for tasks assessed Behavior During Session: [facility] for tasks performed    Balance     End of Session PT - End of Session Activity Tolerance: Patient tolerated treatment well Patient left: in chair;with call bell/phone within reach;with family/visitor present Nurse Communication: Mobility status    [name] 09/16/2011, 3:20 PM

## 2011-09-16 NOTE — Progress Notes (Signed)
Pt stable, scripts, d/c instructions given with no questions/concerns voiced by patient/wife.  Pt tranported via wheelchair to private vehicle by NT and wife.

## 2011-09-16 NOTE — Progress Notes (Signed)
Discharge summary sent to payer through [facility]  

## 2011-09-16 NOTE — Progress Notes (Signed)
Subjective: 1 Day Post-Op Procedure(s) (LRB): TOTAL HIP ARTHROPLASTY ANTERIOR APPROACH (Right)   Patient reports pain as mild, pain well controlled. No events throughout the night. Ready to be discharged home.  Objective:   VITALS:   Filed Vitals:   09/16/11 0544  BP: 125/74  Pulse: 65  Temp: 97.9 F (36.6 C)  Resp: 16    Neurovascular intact Dorsiflexion/Plantar flexion intact Incision: dressing C/D/I No cellulitis present Compartment soft  LABS  Basename 09/16/11 0500  HGB 11.0*  HCT 32.6*  WBC 9.5  PLT 235     Basename 09/16/11 0500  NA 133*  K 3.9  BUN 19  CREATININE 0.56  GLUCOSE 127*     Assessment/Plan: 1 Day Post-Op Procedure(s) (LRB): TOTAL HIP ARTHROPLASTY ANTERIOR APPROACH (Right)   HV drain d/c'ed Foley cath d/c'ed Advance diet Up with therapy D/C IV fluids Discharge home with home health after PT x2 Follow up in 2 weeks at [facility].  Follow-up Information    Follow up with OLIN,Knox Cervi D in 2 weeks.   Contact information:   [facility] [address], [address] [place] [address] [phone]          Anastasio Auerbach. Mieka Leaton   PAC  09/16/2011, 9:05 AM

## 2011-09-16 NOTE — Care Management Note (Signed)
    Page 1 of 2   09/16/2011     3:30:05 PM   CARE MANAGEMENT NOTE 09/16/2011  Patient:  William Norton, William Norton   Account Number:  [id number]  Date Initiated:  09/16/2011  Documentation initiated by:  Colleen Can  Subjective/Objective Assessment:   DX OSTEOARTHRITIS RIGHT HIP; RT HIP REPLACEMNT=ANTERIOR APPROACH     Action/Plan:   CMSPOKE WITH PATIENT.PLANS ARE FOR PT'S RETURN TO HOME WHERE SPOUSE WILL BE CAREGIVER. aLREADY HAS RW BUT WILL NEED 3n1  hh CHOICE OFFERD. PT WANTS AGENCY THAT WILL TAKE INSURANCE.   Anticipated DC Date:  09/16/2011   Anticipated DC Plan:  HOME W HOME HEALTH SERVICES  In-house referral  Clinical Social Worker      DC Planning Services  CM consult      [facility] Choice  HOME HEALTH  DURABLE MEDICAL EQUIPMENT   Choice offered to / List presented to:  C-1 Patient   DME arranged  3-N-1      DME agency  [facility]     HH arranged  HH-2 PT      [facility] agency  [facility]   Status of service:  Completed, signed off Medicare Important Message given?  NA - LOS <3 / Initial given by admissions (If response is "NO", the following Medicare IM given date fields will be blank) Date Medicare IM given:   Date Additional Medicare IM given:    Discharge Disposition:  HOME W HOME HEALTH SERVICES  Per UR Regulation:    If discussed at Long Length of Stay Meetings, dates discussed:    Comments:  05/22/2013LINDA [facility] BSN CCM [phone] [facility] WAS CONTACTED AND IS IN NETWORK WITH PT'S INSURANCE. sPOKE WITH PATIENT AND HE WANTS [facility] TO PROVIDE hh PT. PT IS REQUESTING THAT 3N1 BE DEIVERED TO HIS ROOM. [facility] NOTIFIED AND WILL DELIVER TO PT'S ROOM. fAXED HH ORDERS, FACE SHEET, OP NOTE AND H&P TO [facility] AT [phone]. cONFIRMATION BY [facility]. sERVICES FOR hh PT WILL START TOMORROW 09/17/2011

## 2011-09-16 NOTE — Evaluation (Signed)
Physical Therapy Evaluation Patient Details Name: William Norton MRN: [id number] DOB: March 07, 1946 Today's Date: 09/16/2011 Time: 0815-     PT Assessment / Plan / Recommendation Clinical Impression  Pt with R THR presents with decreased R LE strength/ROM and limitations in functional mobility    PT Assessment  Patient needs continued PT services    Follow Up Recommendations  Home health PT    Barriers to Discharge        lEquipment Recommendations  3 in 1 bedside comode    Recommendations for Other Services OT consult   Frequency 7X/week    Precautions / Restrictions Restrictions Weight Bearing Restrictions: No Other Position/Activity Restrictions: WBAT   Pertinent Vitals/Pain Min c/o pain "I can't believe how it doesn't hurt like the last one"      Mobility  Bed Mobility Bed Mobility: Sit to Supine Sit to Supine: 4: Min assist Details for Bed Mobility Assistance: cues for sequence and use of UEs to self assist Transfers Transfers: Sit to Stand;Stand to Sit Sit to Stand: 4: Min assist Stand to Sit: 4: Min guard Details for Transfer Assistance: cues for LE management and use of UEs to self assist Ambulation/Gait Ambulation/Gait Assistance: 4: Min assist Ambulation Distance (Feet): 162 Feet Assistive device: Rolling walker Ambulation/Gait Assistance Details: cues for stride length, posture and position from RW Gait Pattern: Step-to pattern;Step-through pattern    Exercises Total Joint Exercises Ankle Circles/Pumps: AROM;10 reps;Supine;Both Quad Sets: AROM;10 reps;Supine;Both Heel Slides: AAROM;10 reps;Supine;Right Hip ABduction/ADduction: AAROM;10 reps;Supine;Right   PT Diagnosis: Difficulty walking  PT Problem List: Decreased strength;Decreased range of motion;Decreased activity tolerance;Decreased mobility;Decreased knowledge of use of DME;Pain PT Treatment Interventions: DME instruction;Gait training;Stair training;Functional mobility training;Therapeutic  activities;Therapeutic exercise;Patient/family education   PT Goals Acute Rehab PT Goals PT Goal Formulation: With patient Time For Goal Achievement: 09/19/11 Potential to Achieve Goals: Good Pt will go Supine/Side to Sit: with supervision PT Goal: Supine/Side to Sit - Progress: Goal set today Pt will go Sit to Supine/Side: with supervision PT Goal: Sit to Supine/Side - Progress: Goal set today Pt will go Sit to Stand: with supervision PT Goal: Sit to Stand - Progress: Goal set today Pt will go Stand to Sit: with supervision PT Goal: Stand to Sit - Progress: Goal set today Pt will Ambulate: >150 feet;with supervision;with rolling walker PT Goal: Ambulate - Progress: Goal set today Pt will Go Up / Down Stairs: 1-2 stairs;with min assist;with rolling walker PT Goal: Up/Down Stairs - Progress: Goal set today  Visit Information  Last PT Received On: 09/16/11 Assistance Needed: +1    Subjective Data  Subjective: I wish they could have done the other one this way Patient Stated Goal: Resume previous lifestyle with decreased pain   Prior Functioning  Home Living Lives With: Spouse Available Help at Discharge: Family Type of Home: House Home Access: Stairs to enter Secretary/administrator of Steps: 1 (1+1) Home Layout: One level Home Adaptive Equipment: Walker - rolling Prior Function Level of Independence: Independent Able to Take Stairs?: Yes Driving: Yes 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: No difficulties Dominant Hand: Right    Cognition  Overall Cognitive Status: Appears within functional limits for tasks assessed/performed Arousal/Alertness: Awake/alert Orientation Level: Appears intact for tasks assessed Behavior During Session: [facility] for tasks performed    Extremity/Trunk Assessment Right Upper Extremity Assessment RUE ROM/Strength/Tone: [facility] for tasks assessed Left Upper Extremity Assessment LUE ROM/Strength/Tone: [facility] for tasks  assessed Right Lower Extremity Assessment RLE ROM/Strength/Tone: Deficits RLE ROM/Strength/Tone Deficits: AAROM hip  to 90 flex and 20 abd with strength 3-/5 Left Lower Extremity Assessment LLE ROM/Strength/Tone: [facility] for tasks assessed   Balance    End of Session PT - End of Session Activity Tolerance: Patient tolerated treatment well Patient left: in chair;with call bell/phone within reach Nurse Communication: Mobility status   Christie Copley 09/16/2011, 11:47 AM

## 2011-09-17 ENCOUNTER — Encounter (HOSPITAL_COMMUNITY): Payer: Self-pay | Admitting: Orthopedic Surgery

## 2011-10-07 DIAGNOSIS — M6281 Muscle weakness (generalized): Secondary | ICD-10-CM

## 2011-10-07 DIAGNOSIS — R269 Unspecified abnormalities of gait and mobility: Secondary | ICD-10-CM

## 2011-10-07 DIAGNOSIS — M169 Osteoarthritis of hip, unspecified: Secondary | ICD-10-CM

## 2011-12-10 ENCOUNTER — Ambulatory Visit: Payer: No Typology Code available for payment source | Admitting: Internal Medicine

## 2012-01-01 ENCOUNTER — Ambulatory Visit: Payer: No Typology Code available for payment source | Admitting: Internal Medicine

## 2012-01-13 ENCOUNTER — Ambulatory Visit: Payer: No Typology Code available for payment source | Admitting: Internal Medicine

## 2012-02-19 ENCOUNTER — Ambulatory Visit: Payer: No Typology Code available for payment source | Admitting: Internal Medicine

## 2012-02-23 ENCOUNTER — Ambulatory Visit (INDEPENDENT_AMBULATORY_CARE_PROVIDER_SITE_OTHER): Payer: No Typology Code available for payment source | Admitting: Internal Medicine

## 2012-02-23 ENCOUNTER — Encounter: Payer: Self-pay | Admitting: Internal Medicine

## 2012-02-23 VITALS — BP 116/78 | HR 57 | Temp 97.9°F | Wt 208.0 lb

## 2012-02-23 DIAGNOSIS — M1990 Unspecified osteoarthritis, unspecified site: Secondary | ICD-10-CM

## 2012-02-23 DIAGNOSIS — R894 Abnormal immunological findings in specimens from other organs, systems and tissues: Secondary | ICD-10-CM

## 2012-02-23 DIAGNOSIS — M25559 Pain in unspecified hip: Secondary | ICD-10-CM

## 2012-02-23 DIAGNOSIS — M25552 Pain in left hip: Secondary | ICD-10-CM

## 2012-02-23 DIAGNOSIS — K219 Gastro-esophageal reflux disease without esophagitis: Secondary | ICD-10-CM

## 2012-02-23 DIAGNOSIS — F17200 Nicotine dependence, unspecified, uncomplicated: Secondary | ICD-10-CM

## 2012-02-23 DIAGNOSIS — Z23 Encounter for immunization: Secondary | ICD-10-CM

## 2012-02-23 DIAGNOSIS — M25569 Pain in unspecified knee: Secondary | ICD-10-CM

## 2012-02-23 DIAGNOSIS — G8929 Other chronic pain: Secondary | ICD-10-CM

## 2012-02-23 DIAGNOSIS — R768 Other specified abnormal immunological findings in serum: Secondary | ICD-10-CM

## 2012-02-23 DIAGNOSIS — I10 Essential (primary) hypertension: Secondary | ICD-10-CM

## 2012-02-23 DIAGNOSIS — E119 Type 2 diabetes mellitus without complications: Secondary | ICD-10-CM

## 2012-02-23 LAB — BASIC METABOLIC PANEL
CO2: 24 mEq/L (ref 19–32)
Calcium: 9.6 mg/dL (ref 8.4–10.5)
Chloride: 101 mEq/L (ref 96–112)
Sodium: 134 mEq/L — ABNORMAL LOW (ref 135–145)

## 2012-02-23 LAB — HEMOGLOBIN: Hemoglobin: 12.9 g/dL — ABNORMAL LOW (ref 13.0–17.0)

## 2012-02-23 LAB — HEMOGLOBIN A1C: Hgb A1c MFr Bld: 6.5 % (ref 4.6–6.5)

## 2012-02-23 MED ORDER — BENAZEPRIL HCL 40 MG PO TABS: 40.0000 mg | ORAL_TABLET | Freq: Every day | ORAL | Status: DC

## 2012-02-23 MED ORDER — CELECOXIB 200 MG PO CAPS: 200.0000 mg | ORAL_CAPSULE | Freq: Every day | ORAL | Status: DC | PRN

## 2012-02-23 MED ORDER — OMEPRAZOLE MAGNESIUM 20 MG PO TBEC: 40.0000 mg | DELAYED_RELEASE_TABLET | Freq: Every day | ORAL | Status: DC

## 2012-02-23 MED ORDER — HYDROCHLOROTHIAZIDE 25 MG PO TABS: 25.0000 mg | ORAL_TABLET | Freq: Every day | ORAL | Status: DC

## 2012-02-23 MED ORDER — CYCLOBENZAPRINE HCL 10 MG PO TABS: 10.0000 mg | ORAL_TABLET | Freq: Every evening | ORAL | Status: DC | PRN

## 2012-02-23 NOTE — Assessment & Plan Note (Signed)
On no medications, recognizes his diet is not the best. We discussed diet and exercise Check the A1c

## 2012-02-23 NOTE — Patient Instructions (Addendum)
Try Celebrex once a day as needed, Flexeril, a muscle relaxant at night. Increase Prilosec to 2 tablets 30 minutes before breakfast, please read the GERD prevention diet

## 2012-02-23 NOTE — Assessment & Plan Note (Addendum)
Symptoms not well-controlled, he eats late, right before bedtime. Plan: Increase Prilosec from 20 to 40 mg GERD diet information provided

## 2012-02-23 NOTE — Assessment & Plan Note (Addendum)
His main concern is  aches and pains in different joints, has good days and bad days, has also some myalgias ( not on statins) Check RF  ana but is likely plain OA Had his hip replacement, right, 08/2011, doing well, has postop anemia, check an Hg Trial with Celebrex and Flexeril

## 2012-02-23 NOTE — Assessment & Plan Note (Signed)
No ambulatory BPs, good medication compliance

## 2012-02-23 NOTE — Progress Notes (Addendum)
  Subjective:    Patient ID: Kenyon Ana, male    DOB: 1945/07/29, 66 y.o.   MRN: [id number]  HPI Routine visit GERD, increased symptoms lately despite taking Prilosec once a day, he eats late-->  right before bedtime Diabetes, on diet only, not ambulatory CBGs Hypertension, good medication compliance, not ambulatory BPs DJD, status post right hip replacement, doing well but still has aches and pains at different parts mostly large joints. Denies any swelling or warmness in the hands and wrists. He also has muscle aches. Has taken ibuprofen without much help Tobacco, quit 08/2011!  Past Medical History:  Hypertension  GERD  DM Dx 09-2008 A1C 6.1  Osteoarthritis  2011...leg weakness , pain, ABIs normal ,seen neurology-neurosurgery  Past Surgical History:  tonsilectomy  Cervical laminectomy  knee surgery before, sees Dr. Thomasena Edis  Cataract extraction (2005 and 2010)  Inguinal herniorrhaphy (12/2008)  L hip replacement 6-11  Social History:  truck driver  Married, 3 children  tobacco-- 1 ppd  ETOH-- occasionally drinks beer  diet-- good on-off  Exercise-- sedentary , some walking at work  FH  CAD--no  Stroke -- M in her late 86  DM-- M, S  HTN-- M S  Colon ca-- no  Prostate ca--no  Throat ca-- F (smoker)  Review of Systems No chest pain or shortness of breath No nausea, vomiting, diarrhea    Objective:   Physical Exam General -- alert, well-developed . No apparent distress.  Lungs -- normal respiratory effort, no intercostal retractions, no accessory muscle use, and normal breath sounds.   Heart-- normal rate, regular rhythm, no murmur, and no gallop.   Extremities-- no pretibial edema bilaterally , hands with some deformities  mostly in the knuckles without warmness or redness Neurologic-- alert & oriented X3 and strength normal in all extremities. Psych-- Cognition and judgment appear intact. Alert and cooperative with normal attention span and concentration.  not  anxious appearing and not depressed appearing.       Assessment & Plan:

## 2012-02-23 NOTE — Assessment & Plan Note (Signed)
Quot 08-2011 ! Praised !

## 2012-02-24 ENCOUNTER — Telehealth: Payer: Self-pay

## 2012-02-24 LAB — ANA: Anti Nuclear Antibody(ANA): POSITIVE — AB

## 2012-02-24 LAB — ANTI-NUCLEAR AB-TITER (ANA TITER): ANA Titer 1: 1:160 {titer} — ABNORMAL HIGH

## 2012-02-24 NOTE — Telephone Encounter (Signed)
Called & the prior auth form should be faxed to the office sometime today. I have called the pt to let him know.

## 2012-02-24 NOTE — Telephone Encounter (Signed)
I received the prior auth fax today. I will call to get the form faxed over today.

## 2012-02-24 NOTE — Telephone Encounter (Signed)
Pt called left message on triage line stating yesterday he was Rx Celebrex fount out today that his insurance needs approval for this med first. Plz Advise         MW

## 2012-03-01 NOTE — Addendum Note (Signed)
Addended by: Edwena Felty T on: 03/01/2012 11:47 AM   Modules accepted: Orders

## 2012-04-22 ENCOUNTER — Encounter: Payer: Self-pay | Admitting: Internal Medicine

## 2012-05-03 ENCOUNTER — Telehealth: Payer: Self-pay | Admitting: Internal Medicine

## 2012-05-03 NOTE — Telephone Encounter (Signed)
Noted, will discuss and return to the office. Thank you

## 2012-05-03 NOTE — Telephone Encounter (Signed)
In reference to Rheumatology Referral entered on 03/01/12, no appointment has been scheduled.  Patient has been called and messages left by rheumatology, and I, with no return calls.  I also mailed patient a letter.  As of today, 05/03/12, patient will not respond, nor has he scheduled.

## 2012-05-28 DIAGNOSIS — J189 Pneumonia, unspecified organism: Secondary | ICD-10-CM

## 2012-05-28 HISTORY — DX: Pneumonia, unspecified organism: J18.9

## 2012-06-18 ENCOUNTER — Emergency Department (HOSPITAL_COMMUNITY)
Admission: EM | Admit: 2012-06-18 | Discharge: 2012-06-18 | Disposition: A | Discharge status: Home / Self Care | Payer: Medicare Other | Attending: Emergency Medicine | Admitting: Emergency Medicine

## 2012-06-18 ENCOUNTER — Emergency Department (HOSPITAL_COMMUNITY): Payer: Medicare Other

## 2012-06-18 ENCOUNTER — Encounter (HOSPITAL_COMMUNITY): Payer: Self-pay | Admitting: Emergency Medicine

## 2012-06-18 DIAGNOSIS — Z79899 Other long term (current) drug therapy: Secondary | ICD-10-CM | POA: Insufficient documentation

## 2012-06-18 DIAGNOSIS — Z8739 Personal history of other diseases of the musculoskeletal system and connective tissue: Secondary | ICD-10-CM | POA: Insufficient documentation

## 2012-06-18 DIAGNOSIS — R197 Diarrhea, unspecified: Secondary | ICD-10-CM | POA: Insufficient documentation

## 2012-06-18 DIAGNOSIS — R5381 Other malaise: Secondary | ICD-10-CM | POA: Insufficient documentation

## 2012-06-18 DIAGNOSIS — R12 Heartburn: Secondary | ICD-10-CM | POA: Insufficient documentation

## 2012-06-18 DIAGNOSIS — J189 Pneumonia, unspecified organism: Secondary | ICD-10-CM

## 2012-06-18 DIAGNOSIS — E860 Dehydration: Secondary | ICD-10-CM

## 2012-06-18 DIAGNOSIS — R112 Nausea with vomiting, unspecified: Secondary | ICD-10-CM

## 2012-06-18 DIAGNOSIS — K219 Gastro-esophageal reflux disease without esophagitis: Secondary | ICD-10-CM | POA: Insufficient documentation

## 2012-06-18 DIAGNOSIS — Z87891 Personal history of nicotine dependence: Secondary | ICD-10-CM | POA: Insufficient documentation

## 2012-06-18 DIAGNOSIS — R1013 Epigastric pain: Secondary | ICD-10-CM

## 2012-06-18 DIAGNOSIS — Z8659 Personal history of other mental and behavioral disorders: Secondary | ICD-10-CM | POA: Insufficient documentation

## 2012-06-18 DIAGNOSIS — Z8719 Personal history of other diseases of the digestive system: Secondary | ICD-10-CM | POA: Insufficient documentation

## 2012-06-18 DIAGNOSIS — I10 Essential (primary) hypertension: Secondary | ICD-10-CM | POA: Insufficient documentation

## 2012-06-18 LAB — CBC WITH DIFFERENTIAL/PLATELET
Basophils Absolute: 0 10*3/uL (ref 0.0–0.1)
Basophils Relative: 0 % (ref 0–1)
Eosinophils Absolute: 0.1 10*3/uL (ref 0.0–0.7)
HCT: 39.9 % (ref 39.0–52.0)
Hemoglobin: 13.1 g/dL (ref 13.0–17.0)
MCH: 24.5 pg — ABNORMAL LOW (ref 26.0–34.0)
MCHC: 32.8 g/dL (ref 30.0–36.0)
Monocytes Absolute: 0.8 10*3/uL (ref 0.1–1.0)
Monocytes Relative: 7 % (ref 3–12)
Neutro Abs: 10.3 10*3/uL — ABNORMAL HIGH (ref 1.7–7.7)
RDW: 15.8 % — ABNORMAL HIGH (ref 11.5–15.5)

## 2012-06-18 LAB — LIPASE, BLOOD: Lipase: 37 U/L (ref 11–59)

## 2012-06-18 LAB — COMPREHENSIVE METABOLIC PANEL
AST: 26 U/L (ref 0–37)
Albumin: 4 g/dL (ref 3.5–5.2)
BUN: 55 mg/dL — ABNORMAL HIGH (ref 6–23)
Calcium: 8.8 mg/dL (ref 8.4–10.5)
Chloride: 94 mEq/L — ABNORMAL LOW (ref 96–112)
Creatinine, Ser: 1.88 mg/dL — ABNORMAL HIGH (ref 0.50–1.35)
Total Protein: 7.9 g/dL (ref 6.0–8.3)

## 2012-06-18 LAB — TROPONIN I: Troponin I: <0.3 ng/mL (ref <0.30)

## 2012-06-18 MED ORDER — ONDANSETRON HCL 4 MG/2ML IJ SOLN
4.0000 mg | Freq: Once | INTRAMUSCULAR | Status: AC
  Administered 2012-06-18: 4 mg via INTRAVENOUS
  Filled 2012-06-18: qty 2

## 2012-06-18 MED ORDER — LEVOFLOXACIN 500 MG PO TABS: 500.0000 mg | ORAL_TABLET | Freq: Every day | ORAL | Status: DC

## 2012-06-18 MED ORDER — LEVOFLOXACIN IN D5W 500 MG/100ML IV SOLN
500.0000 mg | Freq: Once | INTRAVENOUS | Status: AC
  Administered 2012-06-18: 500 mg via INTRAVENOUS
  Filled 2012-06-18: qty 100

## 2012-06-18 MED ORDER — ONDANSETRON HCL 4 MG PO TABS: 4.0000 mg | ORAL_TABLET | Freq: Four times a day (QID) | ORAL | Status: DC

## 2012-06-18 MED ORDER — SODIUM CHLORIDE 0.9 % IV BOLUS (SEPSIS)
1000.0000 mL | Freq: Once | INTRAVENOUS | Status: AC
  Administered 2012-06-18: 1000 mL via INTRAVENOUS

## 2012-06-18 NOTE — ED Notes (Signed)
Per Dr. Norlene Campbell, pt. Ambulated down the hall and to the restroom on 94% room air. Pt. Gait steady. Pt. Ambulated without difficulty and assistance. Pt. Assisted to bathroom for safety.

## 2012-06-18 NOTE — ED Notes (Signed)
Pt alert, arrives from home, c/o reflux and nausea, onset several days ago, denies CP or SOB, states "i have thrown up", "i cant keep anything down", resp even unlabored, skin pwd, last BM yesterday

## 2012-06-18 NOTE — ED Provider Notes (Signed)
History     CSN: [id number]  Arrival date & time 06/18/12  0111   First MD Initiated Contact with Patient 06/18/12 [phone]      Chief Complaint  Patient presents with  . Nausea    (Consider location/radiation/quality/duration/timing/severity/associated sxs/prior treatment) HPI 67 year old male presents to emergency department with complaint of generalized fatigue, nausea vomiting diarrhea, chills, and worsening reflux symptoms. He reports symptoms have been ongoing since Wednesday. He has long history of reflux for which he is on Prilosec. He had been stable as long as he controlled his diet. For the last 3 days, however he has had worsening heartburn symptoms. He has required sitting up in order to get relief at night. Beginning on Wednesday he had several episodes of loose stools, followed by nausea and vomiting. On Thursday he felt generally unwell and stayed home from work until the midmorning. He has had generalized weakness. He has had cough at times. He denies any chest pain. Past Medical History  Diagnosis Date  . Hypertension   . Pneumonia     hx of as a child   . Hepatitis     hx of subclinical hepatatis- 40 years ago   . GERD (gastroesophageal reflux disease)   . Arthritis     hips, knees  . Depression     hx of 15 years ago   . Eczema     noted on left hand   . Burn     2nd degree per pt - healed per pt     Past Surgical History  Procedure Laterality Date  . Tonsillectomy    . Other surgical history      birthmark removed right upper arm as a child   . Other surgical history      right knee cartilage removed age 56   . Other surgical history      right knee surgery age 24s  . Cervical laminectomy    . Eye surgery      bilateral cataract surgery   . Hernia repair      2010- right inguinal hernia repair   . Joint replacement      left hip 2011  . Total hip arthroplasty  09/15/2011    Procedure: TOTAL HIP ARTHROPLASTY ANTERIOR APPROACH;  Surgeon: Shelda Pal, MD;  Location: [facility];  Service: Orthopedics;  Laterality: Right;    No family history on file.  History  Substance Use Topics  . Smoking status: Former Smoker -- 2.00 packs/day for 40 years  . Smokeless tobacco: Never Used     Comment: quit 08-2011   . Alcohol Use: Yes     Comment: occasional       Review of Systems  See History of Present Illness; otherwise all other systems are reviewed and negative  Allergies  Review of patient's allergies indicates no known allergies.  Home Medications   Current Outpatient Rx  Name  Route  Sig  Dispense  Refill  . benazepril (LOTENSIN) 40 MG tablet   Oral   Take 40 mg by mouth every morning.         . celecoxib (CELEBREX) 200 MG capsule   Oral   Take 200 mg by mouth every morning.         Marland Kitchen glucosamine-chondroitin 500-400 MG tablet   Oral   Take 1 tablet by mouth every morning.          . hydrochlorothiazide (HYDRODIURIL) 25 MG tablet   Oral   Take  25 mg by mouth every morning.         Marland Kitchen omeprazole (PRILOSEC OTC) 20 MG tablet   Oral   Take 40 mg by mouth every morning.         . thiamine (VITAMIN B-1) 100 MG tablet   Oral   Take 100 mg by mouth every morning.         Marland Kitchen EXPIRED: diphenhydrAMINE (BENADRYL) 25 mg capsule   Oral   Take 1 capsule (25 mg total) by mouth every 6 (six) hours as needed for itching, allergies or sleep.         Marland Kitchen levofloxacin (LEVAQUIN) 500 MG tablet   Oral   Take 1 tablet (500 mg total) by mouth daily.   7 tablet   0   . ondansetron (ZOFRAN) 4 MG tablet   Oral   Take 1 tablet (4 mg total) by mouth every 6 (six) hours.   12 tablet   0     BP 97/57  Pulse 68  Temp(Src) 98.9 F (37.2 C) (Oral)  Resp 20  SpO2 97%  Physical Exam  Nursing note and vitals reviewed. Constitutional: He is oriented to person, place, and time. He appears well-developed and well-nourished.  Uncomfortable but nontoxic appearing  HENT:  Head: Normocephalic and atraumatic.  Nose: Nose  normal.  Dry mucous membranes  Eyes: Conjunctivae and EOM are normal. Pupils are equal, round, and reactive to light.  Neck: Normal range of motion. Neck supple. No JVD present. No tracheal deviation present. No thyromegaly present.  Cardiovascular: Normal rate, regular rhythm, normal heart sounds and intact distal pulses.  Exam reveals no gallop and no friction rub.   No murmur heard. Pulmonary/Chest: Effort normal. No stridor. No respiratory distress. He has no wheezes. He has no rales. He exhibits no tenderness.  Diminished in the bases  Abdominal: Soft. Bowel sounds are normal. He exhibits no distension and no mass. There is tenderness (mild tenderness in epigastrium). There is no rebound and no guarding.  Musculoskeletal: Normal range of motion. He exhibits no edema and no tenderness.  Lymphadenopathy:    He has no cervical adenopathy.  Neurological: He is alert and oriented to person, place, and time. He displays normal reflexes. He exhibits normal muscle tone. Coordination normal.  Skin: Skin is warm and dry. No rash noted. He is not diaphoretic. No erythema. No pallor.    ED Course  Procedures (including critical care time)  Labs Reviewed  CBC WITH DIFFERENTIAL - Abnormal; Notable for the following:    WBC 12.5 (*)    MCV 74.6 (*)    MCH 24.5 (*)    RDW 15.8 (*)    Neutrophils Relative 82 (*)    Neutro Abs 10.3 (*)    Lymphocytes Relative 10 (*)    All other components within normal limits  COMPREHENSIVE METABOLIC PANEL - Abnormal; Notable for the following:    Sodium 131 (*)    Chloride 94 (*)    CO2 18 (*)    Glucose, Bld 112 (*)    BUN 55 (*)    Creatinine, Ser 1.88 (*)    GFR calc non Af Amer 35 (*)    GFR calc Af Amer 41 (*)    All other components within normal limits  LIPASE, BLOOD  TROPONIN I   Dg Chest Port 1 View  06/18/2012  *RADIOLOGY REPORT*  Clinical Data: Nausea, shortness of breath, epigastric pain. Hypertension.  PORTABLE CHEST - 1 VIEW  Comparison:  09/07/2011.  Findings: Shallow inspiration.  Normal heart size and pulmonary vascularity.  There is a hazy opacity over the left lung base, new since previous study, suggesting early infiltration or pneumonia. No blunting of costophrenic angles.  No pneumothorax.  Mediastinal contours appear intact.  Degenerative changes in the shoulders.  IMPRESSION: Suggestion of developing airspace disease in the left lung base. Consider pneumonia.   Original Report Authenticated By: Burman Nieves, M.D.     Date: 06/18/2012  Rate:87  Rhythm: normal sinus rhythm  QRS Axis: normal  Intervals: normal  ST/T Wave abnormalities: normal  Conduction Disutrbances:none  Narrative Interpretation:   Old EKG Reviewed: none available    1. Community acquired pneumonia   2. Dehydration   3. Epigastric pain   4. Nausea vomiting and diarrhea       MDM  67 year old male with generalized malaise, nausea vomiting and diarrhea over the last few days. Patient has been treating his symptoms do to worsening of his reflux. Patient noted to have possible developing pneumonia in left lung base. He has increase in his BUN and creatinine consistent with dehydration which he appears to be clinically. He has slight anion gap. He has received 2 L of IV fluids and IV antibiotics. He is tolerating by mouth fluids. He overall is feeling better and is ready to return home. He has been given precautions for return for any worsening of his symptoms. He reports he will followup with his primary care doctor early next week.        Olivia Mackie, MD 06/18/12 [phone]

## 2012-06-19 ENCOUNTER — Telehealth: Payer: Self-pay | Admitting: Internal Medicine

## 2012-06-19 NOTE — Telephone Encounter (Signed)
Needs ER  F/u this week, please arrange.

## 2012-06-20 ENCOUNTER — Encounter: Payer: Self-pay | Admitting: Internal Medicine

## 2012-06-20 ENCOUNTER — Encounter: Payer: Self-pay | Admitting: *Deleted

## 2012-06-20 ENCOUNTER — Ambulatory Visit (INDEPENDENT_AMBULATORY_CARE_PROVIDER_SITE_OTHER): Payer: Medicare Other | Admitting: Internal Medicine

## 2012-06-20 VITALS — BP 106/62 | HR 71 | Wt 207.0 lb

## 2012-06-20 DIAGNOSIS — K219 Gastro-esophageal reflux disease without esophagitis: Secondary | ICD-10-CM

## 2012-06-20 DIAGNOSIS — J189 Pneumonia, unspecified organism: Secondary | ICD-10-CM

## 2012-06-20 LAB — CBC WITH DIFFERENTIAL/PLATELET
Eosinophils Relative: 3 % (ref 0.0–5.0)
HCT: 40.5 % (ref 39.0–52.0)
Lymphs Abs: 1.7 10*3/uL (ref 0.7–4.0)
Monocytes Relative: 7.5 % (ref 3.0–12.0)
Platelets: 359 10*3/uL (ref 150.0–400.0)
WBC: 8.9 10*3/uL (ref 4.5–10.5)

## 2012-06-20 LAB — BASIC METABOLIC PANEL
CO2: 22 mEq/L (ref 19–32)
Calcium: 9 mg/dL (ref 8.4–10.5)
Chloride: 105 mEq/L (ref 96–112)
Glucose, Bld: 86 mg/dL (ref 70–99)
Potassium: 4.1 mEq/L (ref 3.5–5.1)
Sodium: 136 mEq/L (ref 135–145)

## 2012-06-20 NOTE — Telephone Encounter (Signed)
Pt scheduled today for ER f/u.

## 2012-06-20 NOTE — Progress Notes (Signed)
Subjective:    Patient ID: William Norton, male    DOB: [birth year], 67 y.o.   MRN: [id number]  HPI ER followup: Started to get sick 06/15/2012 with lack of energy, nausea, vomiting, diarrhea. He has GERD symptoms from time to time but for the last few days heartburn has been much more prominent. He felt somehow short of breath. Went to the ER 06/18/2012: White count was 12.5, Na was low @ 131, creatinine was elevated at 1.8. Chest x-ray show a left lower lobe airspace disease. he was felt to have dehydration and pneumonia, good IV fluids, prescribe Levaquin which he is taking.    Past Medical History  Diagnosis Date  . Hypertension   . Hepatitis     hx of subclinical hepatatis- 40 years ago   . GERD (gastroesophageal reflux disease)   . DJD (degenerative joint disease)     hips, knees  . Burn     2nd degree per pt - healed per pt    Past Surgical History  Procedure Laterality Date  . Tonsillectomy    . Other surgical history      birthmark removed right upper arm as a child   . Knee surgery      right knee cartilage removed age 29 and at age 9   . Cervical laminectomy    . Eye surgery      bilateral cataract surgery   . Hernia repair      2010- right inguinal hernia repair   . Total hip arthroplasty  09/15/2011    Procedure: TOTAL HIP ARTHROPLASTY ANTERIOR APPROACH;  Surgeon: Shelda Pal, MD;  Location: [facility];  Service: Orthopedics;  Laterality: Right;   Social History:   truck driver   Married, 3 children   tobacco-- 1 ppd   ETOH-- occasionally drinks beer   diet-- good on-off   Exercise-- sedentary , some walking at work   FH  CAD--no   Stroke -- M in her late 27   DM-- M, S   HTN-- M S   Colon ca-- no   Prostate ca--no   Throat ca-- F (smoker)  Review of Systems Since he left the ER he feels about the same: No fever, some chills. Still has diarrhea, watery, nonbloody. He is able to drink fluids and eat solids without vomiting. No abdominal pain  perse. Still has more reflux than usual, occasionally "food gets stuck in the chest". This is not a new symptoms, is going on for years but in the last few days has been more noticeable. Appetite remains normal Denies chest pain, shortness of breath is gone. No lower extremity edema. no cough, no sputum, no sinus congestion.     Objective:   Physical Exam  General -- alert, well-developed, week but not toxic appearing .   Neck --no mass, no LADs HEENT -- face symmetric and not tender to palpation Lungs -- normal respiratory effort, no intercostal retractions, no accessory muscle use, and normal breath sounds.   Heart-- normal rate, regular rhythm, no murmur, and no gallop.   Abdomen--soft, non-tender, no distention, no masses, no HSM, no guarding, and no rigidity.   Extremities-- no pretibial edema bilaterally  Neurologic-- alert & oriented X3 and strength normal in all extremities. Psych-- Cognition and judgment appear intact. Alert and cooperative with normal attention span and concentration.  not anxious appearing and not depressed appearing.       Assessment & Plan:   Dehydration, Creatinine was elevated at the  ER, status post IV fluids,  wife reports he is drinking plenty of fluids. Recheck labs.  RTC 2 weeks

## 2012-06-20 NOTE — Assessment & Plan Note (Signed)
Pneumonia, Patient was found to have LLL infiltrate, he denies fever or cough but did have the elevated white count and some shortness of breath. For now we'll continue with Levaquin, if his kidney function continued to be decreased, may need to change antibiotics. Work excuse x this week

## 2012-06-20 NOTE — Patient Instructions (Addendum)
HOLD celebrex, hycrochlorothiazide and lotensin for 3 to 4 days Continue levaquin, zofran and drink plenty of fluids Take samples of dexilant 1 a day instead of prilosec Come back in 2 weeks Call any time if severe symptoms, high fever , short of breath, blood in stools. Call if not improving in the next 2-3 days.

## 2012-06-20 NOTE — Assessment & Plan Note (Signed)
GI symptoms, Having persistent diarrhea, increased GERD symptoms of some dysphagia. Plan:change Prilosec  40 mg to dexilant 60 mg qd (temporarily, samples provided); also refer to GI given dysphagia (which according to the patient is a chronic problem, worse in the last few days )

## 2012-06-21 ENCOUNTER — Encounter: Payer: Self-pay | Admitting: Internal Medicine

## 2012-06-23 ENCOUNTER — Encounter: Payer: Self-pay | Admitting: *Deleted

## 2012-06-29 ENCOUNTER — Ambulatory Visit: Payer: No Typology Code available for payment source | Admitting: Internal Medicine

## 2012-07-06 ENCOUNTER — Encounter: Payer: Self-pay | Admitting: Internal Medicine

## 2012-07-06 ENCOUNTER — Ambulatory Visit (INDEPENDENT_AMBULATORY_CARE_PROVIDER_SITE_OTHER): Payer: Medicare Other | Admitting: Internal Medicine

## 2012-07-06 VITALS — BP 110/82 | HR 64 | Wt 215.0 lb

## 2012-07-06 DIAGNOSIS — K219 Gastro-esophageal reflux disease without esophagitis: Secondary | ICD-10-CM

## 2012-07-06 DIAGNOSIS — J189 Pneumonia, unspecified organism: Secondary | ICD-10-CM

## 2012-07-06 DIAGNOSIS — I10 Essential (primary) hypertension: Secondary | ICD-10-CM

## 2012-07-06 DIAGNOSIS — E119 Type 2 diabetes mellitus without complications: Secondary | ICD-10-CM

## 2012-07-06 NOTE — Assessment & Plan Note (Signed)
Due for labs

## 2012-07-06 NOTE — Assessment & Plan Note (Signed)
Resolved. S/p abx, get a f/u CXR

## 2012-07-06 NOTE — Patient Instructions (Addendum)
Please get your x-ray at the other [facility]  office located at: [address], across from [facility].  Please go to the basement, this is a walk-in facility, they are open from 8:30 to 5:30 PM. Phone number [phone].  

## 2012-07-06 NOTE — Assessment & Plan Note (Signed)
No better, see HPI. rec not to eat late! Instructions provided  To see GI soon

## 2012-07-06 NOTE — Progress Notes (Signed)
  Subjective:    Patient ID: William Norton, male    DOB: [birth year], 68 y.o.   MRN: [id number]  HPI Followup from previous visit PNM: status post antibiotics, feeling better. Had diarrhea, symptoms resolved. GERD, still having a lot of problems, mostly at night. Admits that he eats a large meal late at night and then goes to bed. See assessment and plan. Hypertension, he has been holding HZTZ, BP today is excellent.  Past Medical History  Diagnosis Date  . Hypertension   . Hepatitis     hx of subclinical hepatatis- 40 years ago   . GERD (gastroesophageal reflux disease)   . DJD (degenerative joint disease)     hips, knees  . Burn     2nd degree per pt - healed per pt    Past Surgical History  Procedure Laterality Date  . Tonsillectomy    . Other surgical history      birthmark removed right upper arm as a child   . Knee surgery      right knee cartilage removed age 11 and at age 82   . Cervical laminectomy    . Eye surgery      bilateral cataract surgery   . Hernia repair      2010- right inguinal hernia repair   . Total hip arthroplasty  09/15/2011    Procedure: TOTAL HIP ARTHROPLASTY ANTERIOR APPROACH;  Surgeon: Shelda Pal, MD;  Location: [facility];  Service: Orthopedics;  Laterality: Right;   Review of Systems No fever chills. No chest pain, shortness of breath, cough.    Objective:   Physical Exam General -- alert, well-developed Lungs -- normal respiratory effort, no intercostal retractions, no accessory muscle use, and normal breath sounds.   Heart-- normal rate, regular rhythm, no murmur, and no gallop.   Extremities-- no pretibial edema bilaterally  Psych-- Cognition and judgment appear intact. Alert and cooperative with normal attention span and concentration.  not anxious appearing and not depressed appearing.       Assessment & Plan:

## 2012-07-06 NOTE — Assessment & Plan Note (Signed)
Off HCTZ since last OV, BP very good today Plan-- don't restart HCTZ, check a K+

## 2012-07-18 ENCOUNTER — Ambulatory Visit (INDEPENDENT_AMBULATORY_CARE_PROVIDER_SITE_OTHER): Payer: Medicare Other | Admitting: Internal Medicine

## 2012-07-18 ENCOUNTER — Encounter: Payer: Self-pay | Admitting: Internal Medicine

## 2012-07-18 VITALS — BP 124/70 | HR 85 | Ht 71.0 in | Wt 214.0 lb

## 2012-07-18 DIAGNOSIS — Z1211 Encounter for screening for malignant neoplasm of colon: Secondary | ICD-10-CM

## 2012-07-18 DIAGNOSIS — K219 Gastro-esophageal reflux disease without esophagitis: Secondary | ICD-10-CM

## 2012-07-18 DIAGNOSIS — R1310 Dysphagia, unspecified: Secondary | ICD-10-CM

## 2012-07-18 MED ORDER — OMEPRAZOLE-SODIUM BICARBONATE 40-1100 MG PO CAPS: 1.0000 | ORAL_CAPSULE | Freq: Every day | ORAL | Status: DC

## 2012-07-18 NOTE — Progress Notes (Signed)
Subjective:  Referred by: Wanda Plump, MD   Patient ID: William Norton, male    DOB: 12-08-1945, 66 y.o.   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 He said reflux for many years and reports an upper end[place] in [place] that apparently was unremarkable 20 or 30 years ago. At some point he took Nexium but it did not completely relieve his symptoms. He is using a lot of antacids over the years and for the past 3 years he is taking omeprazole 40 mg every morning. He does pretty well during the day, he actually avoids eating much because it makes her sleepy. He eats a large meal and then wakes 3 or 4 hours before going to bed but 2-3 hours after lying down he will often awaking with reflux and choking. He is also describing mild symptoms of food not passing down right in 0.2 his upper chest no that's intermittent. I don't think he's had to regurgitate anything.  He does drink 3-4 caffeinated beverages a day but says he has cut way back. He cannot raise the head of his bed to his wife's foot or ankle problems and her needing those elevated. He does sleep in a chair sometimes. He tries TUMS which helped somewhat but don't stopped this problem as he takes them after the fact. His bowel habits in general are regular there was having a few more bowel movements and he did recently since starting Celebrex. He has gained weight which he attributes to inactivity associated with orthopedic surgeries and a chronic right knee pain.  He has never had a colonoscopy and is willing to do so but has to schedule that carefully as he does not want to miss work for procedures, if he can help that.  No Known Allergies Outpatient Prescriptions Prior to Visit  Medication Sig Dispense Refill  . benazepril (LOTENSIN) 40 MG tablet Take 40 mg by mouth every morning.      . celecoxib (CELEBREX) 200 MG capsule Take 200 mg by mouth every morning.      Marland Kitchen  omeprazole (PRILOSEC OTC) 20 MG tablet Take 40 mg by mouth every morning.      . diphenhydrAMINE (BENADRYL) 25 mg capsule Take 1 capsule (25 mg total) by mouth every 6 (six) hours as needed for itching, allergies or sleep.       No facility-administered medications prior to visit.   Past Medical History  Diagnosis Date  . Hypertension   . Hepatitis     hx of subclinical hepatatis- 40 years ago   . GERD (gastroesophageal reflux disease)   . DJD (degenerative joint disease)     hips, knees  . Burn     2nd degree per pt - healed per pt   . Pneumonia, community acquired 05/2012   Past Surgical History  Procedure Laterality Date  . Tonsillectomy    . Other surgical history      birthmark removed right upper arm as a child   . Knee surgery      right knee cartilage removed age 23 and at age 71   . Cervical laminectomy    . Eye surgery      bilateral cataract surgery   . Hernia repair      2010- right inguinal hernia repair   . Total hip arthroplasty  09/15/2011    Procedure: TOTAL HIP ARTHROPLASTY ANTERIOR APPROACH;  Surgeon: Shelda Pal,  MD;  Location: [facility];  Service: Orthopedics;  Laterality: Right;  . Upper gastrointestinal endoscopy    . Hip arthroplasty  2011    left   History   Social History  . Marital Status: Married    Spouse Name: N/A    Number of Children: 3  .     Occupational History  .  truck driver     Social History Main Topics  . Smoking status: Former Smoker -- 2.00 packs/day for 40 years  . Smokeless tobacco: Never Used     Comment: quit 08-2011   . Alcohol Use: Yes     Comment: occasional   . Drug Use: No    Social History Narrative  .  married 2 sons one daughter, truck Hospital doctor. 3-4 caffeinated beverages daily.    Family History  Problem Relation Age of Onset  . Breast cancer Sister   . Throat cancer Father     died from  . Diabetes Mother   . Diabetes Mother   . Heart disease Mother        Review of Systems     Objective:    Physical Exam General:  Well-developed, well-nourished and in no acute distress Eyes:  anicteric. ENT:   Mouth and posterior pharynx free of lesions.  Neck:   supple w/o thyromegaly or mass.  Lungs: Clear to auscultation bilaterally. Heart:  S1S2, no rubs, murmurs, gallops. Abdomen:  soft, non-tender, no hepatosplenomegaly, hernia, or mass and BS+.  Lymph:  no cervical or supraclavicular adenopathy. Extremities:   no edema Neuro:  A&O x 3.  Psych:  appropriate mood and  Affect.   Data Reviewed: Recent PCP visit     Assessment & Plan:  GERD (gastroesophageal reflux disease)  Dysphagia, unspecified  Special screening for malignant neoplasms, colon  1. Add nocturnal Zegerid, samples of the 40 mg strength given. 2. EGD with possible biopsies and esophageal dilation to evaluate the persistent reflux and the dysphagia. The risks and benefits as well as alternatives of endoscopic procedure(s) have been discussed and reviewed. All questions answered. The patient agrees to proceed. 3. GERD handout, and try to reduce caffeine further. Hopefully he'll be able to exercise further at some point and try to lose some of his abdominal girth which is likely contributing to an increase in reflux symptoms. 4. He wants to defer colonoscopy until he can be off work a whole day  I appreciate the opportunity to care for this patient.  CC: Willow Ora, MD

## 2012-07-18 NOTE — Patient Instructions (Addendum)
You have been scheduled for an endoscopy with propofol. Please follow written instructions given to you at your visit today. If you use inhalers (even only as needed), please bring them with you on the day of your procedure.  Today you are being given samples of Zegerid to try one capsule at bedtime.  If this helps you may purchase this over the counter to continue.  Thank you for choosing me and [facility].  Iva Boop, M.D., [facility]

## 2012-07-25 ENCOUNTER — Ambulatory Visit (AMBULATORY_SURGERY_CENTER): Payer: Medicare Other | Admitting: Internal Medicine

## 2012-07-25 ENCOUNTER — Encounter: Payer: Self-pay | Admitting: Internal Medicine

## 2012-07-25 VITALS — BP 92/68 | HR 65 | Temp 97.4°F | Resp 18 | Ht 71.0 in | Wt 214.0 lb

## 2012-07-25 DIAGNOSIS — K2270 Barrett's esophagus without dysplasia: Secondary | ICD-10-CM

## 2012-07-25 DIAGNOSIS — R1310 Dysphagia, unspecified: Secondary | ICD-10-CM

## 2012-07-25 DIAGNOSIS — K449 Diaphragmatic hernia without obstruction or gangrene: Secondary | ICD-10-CM

## 2012-07-25 DIAGNOSIS — K219 Gastro-esophageal reflux disease without esophagitis: Secondary | ICD-10-CM

## 2012-07-25 MED ORDER — SODIUM CHLORIDE 0.9 % IV SOLN
500.0000 mL | INTRAVENOUS | Status: DC
Start: 2012-07-25 — End: 2012-07-26

## 2012-07-25 MED ORDER — METOCLOPRAMIDE HCL 10 MG PO TABS: 10.0000 mg | ORAL_TABLET | Freq: Every day | ORAL | Status: DC

## 2012-07-25 NOTE — Op Note (Signed)
[facility] [address] [place], [address]   ENDOSCOPY PROCEDURE REPORT  PATIENT: William Norton, William Norton  MR#: [id number] BIRTHDATE: [birth year] , 66  yrs. old GENDER: Male ENDOSCOPIST: Iva Boop, MD, [facility] REFERRED BY:  Willow Ora, M.D. PROCEDURE DATE:  07/25/2012 PROCEDURE:  EGD w/ biopsy ASA CLASS:     Class II INDICATIONS:  Heartburn.   Dysphagia.  regurgitation MEDICATIONS: propofol (Diprivan) 300mg  IV, MAC sedation, administered by CRNA, and These medications were titrated to patient response per physician's verbal order TOPICAL ANESTHETIC: none  DESCRIPTION OF PROCEDURE: After the risks benefits and alternatives of the procedure were thoroughly explained, informed consent was obtained.  The [facility] GIF-H180 [id number] endoscope was introduced through the mouth and advanced to the second portion of the duodenum. Without limitations.  The instrument was slowly withdrawn as the mucosa was fully examined.      ESOPHAGUS: There was evidence of suspected Barrett's esophagus in the lower third of the esophagus.  Multiple biopsies were performed using cold forceps.  Sample sent for histology.  STOMACH: A 5 cm hiatal hernia was noted.  The remainder of the upper endoscopy exam was otherwise normal. Retroflexed views revealed a hiatal hernia.     The scope was then withdrawn from the patient and the procedure completed.  COMPLICATIONS: There were no complications. ENDOSCOPIC IMPRESSION: 1.   There was evidence of suspected Barrett's esophagus; multiple biopsies 2.   5 cm hiatal hernia 3.   The remainder of the upper endoscopy exam was otherwise normal  RECOMMENDATIONS: Continue AM PPI but stop nocturnal Zegerid Start metaclopramide 10 mg at bedtime See Dr. Leone Payor in late May  eSigned:  Iva Boop, MD, [facility] 07/25/2012 3:07 PM  XB:JYNW Drue Novel, MD, The Patient

## 2012-07-25 NOTE — Progress Notes (Signed)
Called to room to assist during endoscopic procedure.  Patient ID and intended procedure confirmed with present staff. Received instructions for my participation in the procedure from the performing physician.  

## 2012-07-25 NOTE — Patient Instructions (Addendum)
You have a hiatal hernia - stomach moves up into your chest. You may also have a condition called Barrett's esophagus - biopsies taken. Nothing terrible seen.  I recommend you take a medication called metaclopramide before bedtime - I will prescribe this today.  You can stop the Zegerid samples at bedtime since they did not help.  Please schedule a follow-up visit for 2 months also.  Thank you for choosing me and [facility].  Iva Boop, MD, [facility]   Discharge instructions given with verbal understanding. Handout on a hiatal hernia given. Resume previous medications. YOU HAD AN ENDOSCOPIC PROCEDURE TODAY AT THE [facility]: Refer to the procedure report that was given to you for any specific questions about what was found during the examination.  If the procedure report does not answer your questions, please call your gastroenterologist to clarify.  If you requested that your care partner not be given the details of your procedure findings, then the procedure report has been included in a sealed envelope for you to review at your convenience later.  YOU SHOULD EXPECT: Some feelings of bloating in the abdomen. Passage of more gas than usual.  Walking can help get rid of the air that was put into your GI tract during the procedure and reduce the bloating. If you had a lower endoscopy (such as a colonoscopy or flexible sigmoidoscopy) you may notice spotting of blood in your stool or on the toilet paper. If you underwent a bowel prep for your procedure, then you may not have a normal bowel movement for a few days.  DIET: Your first meal following the procedure should be a light meal and then it is ok to progress to your normal diet.  A half-sandwich or bowl of soup is an example of a good first meal.  Heavy or fried foods are harder to digest and may make you feel nauseous or bloated.  Likewise meals heavy in dairy and vegetables can cause extra gas to form and this can  also increase the bloating.  Drink plenty of fluids but you should avoid alcoholic beverages for 24 hours.  ACTIVITY: Your care partner should take you home directly after the procedure.  You should plan to take it easy, moving slowly for the rest of the day.  You can resume normal activity the day after the procedure however you should NOT DRIVE or use heavy machinery for 24 hours (because of the sedation medicines used during the test).    SYMPTOMS TO REPORT IMMEDIATELY: A gastroenterologist can be reached at any hour.  During normal business hours, 8:30 AM to 5:00 PM Monday through Friday, call [phone].  After hours and on weekends, please call the GI answering service at [phone] who will take a message and have the physician on call contact you.   Following upper endoscopy (EGD)  Vomiting of blood or coffee ground material  New chest pain or pain under the shoulder blades  Painful or persistently difficult swallowing  New shortness of breath  Fever of 100F or higher  Black, tarry-looking stools  FOLLOW UP: If any biopsies were taken you will be contacted by phone or by letter within the next 1-3 weeks.  Call your gastroenterologist if you have not heard about the biopsies in 3 weeks.  Our staff will call the home number listed on your records the next business day following your procedure to check on you and address any questions or concerns that you may have  at that time regarding the information given to you following your procedure. This is a courtesy call and so if there is no answer at the home number and we have not heard from you through the emergency physician on call, we will assume that you have returned to your regular daily activities without incident.  SIGNATURES/CONFIDENTIALITY: You and/or your care partner have signed paperwork which will be entered into your electronic medical record.  These signatures attest to the fact that that the information above on your  After Visit Summary has been reviewed and is understood.  Full responsibility of the confidentiality of this discharge information lies with you and/or your care-partner.

## 2012-07-25 NOTE — Progress Notes (Signed)
Patient did not experience any of the following events: a burn prior to discharge; a fall within the facility; wrong site/side/patient/procedure/implant event; or a hospital transfer or hospital admission upon discharge from the facility. (G8907) Patient did not have preoperative order for IV antibiotic SSI prophylaxis. (G8918)  

## 2012-07-26 ENCOUNTER — Telehealth: Payer: Self-pay | Admitting: *Deleted

## 2012-07-26 NOTE — Telephone Encounter (Signed)
  Follow up Call-  Call back number 07/25/2012  Post procedure Call Back phone  # [phone]  Permission to leave phone message Yes     Patient questions:  Do you have a fever, pain , or abdominal swelling? no Pain Score  0 *  Have you tolerated food without any problems? yes  Have you been able to return to your normal activities? yes  Do you have any questions about your discharge instructions: Diet   no Medications  no Follow up visit  no  Do you have questions or concerns about your Care? no  Actions: * If pain score is 4 or above: No action needed, pain <4.

## 2012-07-31 ENCOUNTER — Encounter: Payer: Self-pay | Admitting: Internal Medicine

## 2012-07-31 DIAGNOSIS — K2270 Barrett's esophagus without dysplasia: Secondary | ICD-10-CM

## 2012-07-31 HISTORY — DX: Barrett's esophagus without dysplasia: K22.70

## 2012-07-31 NOTE — Progress Notes (Signed)
Quick Note:  Barrett's Repeat egd 07/2015-approx ______

## 2012-08-15 ENCOUNTER — Encounter: Payer: Self-pay | Admitting: Internal Medicine

## 2012-09-20 ENCOUNTER — Ambulatory Visit (INDEPENDENT_AMBULATORY_CARE_PROVIDER_SITE_OTHER): Payer: Medicare Other | Admitting: Internal Medicine

## 2012-09-20 ENCOUNTER — Encounter: Payer: Self-pay | Admitting: Internal Medicine

## 2012-09-20 VITALS — BP 128/74 | HR 107 | Temp 98.0°F | Wt 215.0 lb

## 2012-09-20 DIAGNOSIS — Z01810 Encounter for preprocedural cardiovascular examination: Secondary | ICD-10-CM

## 2012-09-20 DIAGNOSIS — I10 Essential (primary) hypertension: Secondary | ICD-10-CM

## 2012-09-20 DIAGNOSIS — Z7902 Long term (current) use of antithrombotics/antiplatelets: Secondary | ICD-10-CM | POA: Insufficient documentation

## 2012-09-20 DIAGNOSIS — E119 Type 2 diabetes mellitus without complications: Secondary | ICD-10-CM

## 2012-09-20 NOTE — Assessment & Plan Note (Signed)
Well-controlled per last hemoglobin A1c

## 2012-09-20 NOTE — Assessment & Plan Note (Signed)
Well controlled 

## 2012-09-20 NOTE — Assessment & Plan Note (Addendum)
67 year old gentleman in need of a total knee replacement. He quit tobacco a year ago, diabetes is well controlled on diet, review of systems is negative for cardiopulmonary problems. Recent labs and most recent EKG stable.No evidence of heart failure, sleep apnea on clinical grounds. Never had a stress test and he has DM plus a sedentary lifestyle, consequently will rx a stress test to be sure there is no underlying cardiovascular disease.  Plan: Stress test

## 2012-09-20 NOTE — Progress Notes (Signed)
Subjective:    Patient ID: William Norton, male    DOB: 04-13-1946, 67 y.o.   MRN: [id number]  HPI Here to discuss  the following issues: Surgical clearance, to have a total knee replacement. He reports that he is feeling well, is able to do all his activities of daily living without symptoms, no formal exercise, activity is limited by knee pain. Diabetes, on diet control, no ambulatory blood sugars. Hypertension, good medication compliance, not ambulatory BPs.  Past Medical History  Diagnosis Date  . Hypertension   . Hepatitis     hx of subclinical hepatatis- 40 years ago   . GERD (gastroesophageal reflux disease)   . DJD (degenerative joint disease)     hips, knees  . Burn     2nd degree per pt - healed per pt   . Pneumonia, community acquired 05/2012  . Barrett's esophagus 07/31/2012   Past Surgical History  Procedure Laterality Date  . Tonsillectomy    . Other surgical history      birthmark removed right upper arm as a child   . Knee surgery      right knee cartilage removed age 52 and at age 47   . Cervical laminectomy    . Eye surgery      bilateral cataract surgery   . Hernia repair      2010- right inguinal hernia repair   . Total hip arthroplasty  09/15/2011    Procedure: TOTAL HIP ARTHROPLASTY ANTERIOR APPROACH;  Surgeon: Shelda Pal, MD;  Location: [facility];  Service: Orthopedics;  Laterality: Right;  . Upper gastrointestinal endoscopy    . Hip arthroplasty  2011    left   History   Social History  . Marital Status: Married    Spouse Name: N/A    Number of Children: 3  . Years of Education: N/A   Occupational History  . truck driver    Social History Main Topics  . Smoking status: Former Smoker -- 2.00 packs/day for 40 years  . Smokeless tobacco: Never Used     Comment: quit 08-2011   . Alcohol Use: Yes     Comment: occasional   . Drug Use: No  . Sexually Active: Not on file   Other Topics Concern  . Not on file   Social History Narrative   He  is married, 2 sons one daughter   Short haul truck Publishing rights manager, denies: Fever   Chills   Fatigue    Cardiovascular, denies: Chest pain   Shortness or breath   Palpitations   Edema Respiratory, denies: Cough    sputum production   wheezing    shortness or breath GI, denies: Nausea  Vomiting  Diarrhea  Blood in the stools Heartburn well-controlled       Objective:   Physical Exam BP 128/74  Pulse 107  Temp(Src) 98 F (36.7 C) (Oral)  Wt 215 lb (97.523 kg)  BMI 30 kg/m2  SpO2 95%  General -- alert, well-developed, NAD   Neck --no JVD Lungs -- normal respiratory effort, no intercostal retractions, no accessory muscle use, and normal breath sounds.   Heart-- normal rate, regular rhythm, no murmur, and no gallop.   Abdomen--soft, non-tender, no distention, no masses .   Extremities-- no pretibial edema bilaterally Neurologic-- alert & oriented X3 and strength normal in all extremities. Psych-- Cognition and judgment appear intact. Alert and cooperative with normal attention span and  concentration.  not anxious appearing and not depressed appearing.      Assessment & Plan:

## 2012-09-20 NOTE — Patient Instructions (Addendum)
Will schedule a stress test. Next visit with me in 4 months

## 2012-09-21 ENCOUNTER — Other Ambulatory Visit: Payer: Self-pay | Admitting: Internal Medicine

## 2012-09-21 NOTE — Telephone Encounter (Signed)
Refill done.  

## 2012-09-28 ENCOUNTER — Ambulatory Visit (HOSPITAL_COMMUNITY): Payer: Medicare Other | Attending: Cardiology | Admitting: Radiology

## 2012-09-28 VITALS — BP 106/65 | Ht 71.5 in | Wt 207.0 lb

## 2012-09-28 DIAGNOSIS — R0609 Other forms of dyspnea: Secondary | ICD-10-CM | POA: Insufficient documentation

## 2012-09-28 DIAGNOSIS — E785 Hyperlipidemia, unspecified: Secondary | ICD-10-CM | POA: Insufficient documentation

## 2012-09-28 DIAGNOSIS — Z87891 Personal history of nicotine dependence: Secondary | ICD-10-CM | POA: Insufficient documentation

## 2012-09-28 DIAGNOSIS — R5381 Other malaise: Secondary | ICD-10-CM | POA: Insufficient documentation

## 2012-09-28 DIAGNOSIS — R0989 Other specified symptoms and signs involving the circulatory and respiratory systems: Secondary | ICD-10-CM | POA: Insufficient documentation

## 2012-09-28 DIAGNOSIS — I10 Essential (primary) hypertension: Secondary | ICD-10-CM | POA: Insufficient documentation

## 2012-09-28 DIAGNOSIS — Z01810 Encounter for preprocedural cardiovascular examination: Secondary | ICD-10-CM

## 2012-09-28 DIAGNOSIS — E119 Type 2 diabetes mellitus without complications: Secondary | ICD-10-CM | POA: Insufficient documentation

## 2012-09-28 DIAGNOSIS — R42 Dizziness and giddiness: Secondary | ICD-10-CM | POA: Insufficient documentation

## 2012-09-28 DIAGNOSIS — R0602 Shortness of breath: Secondary | ICD-10-CM

## 2012-09-28 MED ORDER — TECHNETIUM TC 99M SESTAMIBI GENERIC - CARDIOLITE
30.0000 | Freq: Once | INTRAVENOUS | Status: AC | PRN
Start: 2012-09-28 — End: 2012-09-28
  Administered 2012-09-28: 30 via INTRAVENOUS

## 2012-09-28 MED ORDER — REGADENOSON 0.4 MG/5ML IV SOLN
0.4000 mg | Freq: Once | INTRAVENOUS | Status: AC
  Administered 2012-09-28: 0.4 mg via INTRAVENOUS

## 2012-09-28 MED ORDER — TECHNETIUM TC 99M SESTAMIBI GENERIC - CARDIOLITE
10.0000 | Freq: Once | INTRAVENOUS | Status: AC | PRN
  Administered 2012-09-28: 10 via INTRAVENOUS

## 2012-09-28 NOTE — Progress Notes (Signed)
[facility] SITE 3 NUCLEAR MED [address] [place], [place] [address] [phone]    Cardiology Nuclear Med Study  William Norton is a 67 y.o. male     MRN : [id number]     DOB: [birth year]  Procedure Date: 09/28/2012  Nuclear Med Background Indication for Stress Test:  Evaluation for Ischemia and Surgical Clearance- Pending TKR Dr. Durene Romans History:  GXT > 20 yrs ago, Normal per patient Cardiac Risk Factors: History of Smoking, Hypertension, Lipids and NIDDM  Symptoms:  DOE, Fatigue and Light-Headedness   Nuclear Pre-Procedure Caffeine/Decaff Intake:  None NPO After: 7:00pm   Lungs:  clear O2 Sat: 97% on room air. IV 0.9% NS with Angio Cath:  22g  IV Site: R Hand  IV Started by:  Bonnita Levan, RN  Chest Size (in):  44 Cup Size: n/a  Height: 5' 11.5" (1.816 m)  Weight:  207 lb (93.895 kg)  BMI:  Body mass index is 28.47 kg/(m^2). Tech Comments:  N/A    Nuclear Med Study 1 or 2 day study: 1 day  Stress Test Type:  Lexiscan  Reading MD: Cassell Clement, MD  Order Authorizing Provider:  Elita Quick [name],MD  Resting Radionuclide: Technetium 66m Sestamibi  Resting Radionuclide Dose: 11.0 mCi   Stress Radionuclide:  Technetium 87m Sestamibi  Stress Radionuclide Dose: 33.0 mCi           Stress Protocol Rest HR: 54 Stress HR: 70  Rest BP: 106/65 Stress BP: 106/66  Exercise Time (min): n/a METS: n/a   Predicted Max HR: 152 bpm % Max HR: 46.05 bpm Rate Pressure Product: 7420   Dose of Adenosine (mg):  n/a Dose of Lexiscan: 0.4 mg  Dose of Atropine (mg): n/a Dose of Dobutamine: n/a mcg/kg/min (at max HR)  Stress Test Technologist: Bonnita Levan, RN  Nuclear Technologist:  Domenic Polite, CNMT     Rest Procedure:  Myocardial perfusion imaging was performed at rest 45 minutes following the intravenous administration of Technetium 66m Sestamibi. Rest ECG: NSR - Normal EKG  Stress Procedure:  The patient received IV Lexiscan 0.4 mg over 15-seconds.  Technetium 3m  Sestamibi injected at 30-seconds.  Quantitative spect images were obtained after a 45 minute delay. Stress ECG: No significant change from baseline ECG  QPS Raw Data Images:  Normal; no motion artifact; normal heart/lung ratio. Stress Images:  Normal homogeneous uptake in all areas of the myocardium. Rest Images:  Normal homogeneous uptake in all areas of the myocardium. Subtraction (SDS):  No evidence of ischemia. Transient Ischemic Dilatation (Normal <1.22):  1.04 Lung/Heart Ratio (Normal <0.45):  0.35  Quantitative Gated Spect Images QGS EDV:  99 ml QGS ESV:  35 ml  Impression Exercise Capacity:  Lexiscan with no exercise. BP Response:  Normal blood pressure response. Clinical Symptoms:  Mild chest pain/dyspnea. ECG Impression:  No significant ST segment change suggestive of ischemia. Comparison with Prior Nuclear Study: No images to compare  Overall Impression:  Normal stress nuclear study.  LV Ejection Fraction: 65%.  LV Wall Motion:  NL LV Function; NL Wall Motion  [employer]

## 2012-09-29 ENCOUNTER — Encounter (HOSPITAL_COMMUNITY): Payer: Medicare Other

## 2012-09-29 ENCOUNTER — Telehealth: Payer: Self-pay | Admitting: Internal Medicine

## 2012-09-29 NOTE — Telephone Encounter (Signed)
Advise patient: Stress test negative, clear for surgery Also notify orthopedic surgery.

## 2012-09-29 NOTE — Telephone Encounter (Signed)
lmovm for pt to return call.  

## 2012-09-29 NOTE — Telephone Encounter (Signed)
Pt made aware

## 2012-10-03 ENCOUNTER — Encounter: Payer: Self-pay | Admitting: *Deleted

## 2012-10-06 ENCOUNTER — Encounter (HOSPITAL_COMMUNITY): Payer: Self-pay | Admitting: Pharmacy Technician

## 2012-10-10 NOTE — Progress Notes (Signed)
EKG 06/18/12 on EPIC, Chest x-ray 06/18/12 on EPIC is abnormal and will repeat, surgery clearance note Dr. Drue Novel on chart.

## 2012-10-10 NOTE — Patient Instructions (Addendum)
20 CLANCY MULLARKEY  10/10/2012   Your procedure is scheduled on: 10/18/12  Report to [facility] Stay Center at 06:00 AM.  Call this number if you have problems the morning of surgery [phone]   Remember:   Do not eat food or drink liquids After Midnight.     Take these medicines the morning of surgery with A SIP OF WATER: prilosec   Do not wear jewelry, make-up or nail polish.  Do not wear lotions, powders, or perfumes. You may wear deodorant.  Do not shave 48 hours prior to surgery. Men may shave face and neck.  Do not bring valuables to the hospital.  Contacts, dentures or bridgework may not be worn into surgery.  Leave suitcase in the car. After surgery it may be brought to your room.  For patients admitted to the hospital, checkout time is 11:00 AM the day of discharge.   Special Instructions: Shower using CHG 2 nights before surgery and the night before surgery.  If you shower the day of surgery use CHG.  Use special wash - you have one bottle of CHG for all showers.  You should use approximately 1/3 of the bottle for each shower.   Please read over the following fact sheets that you were given: MRSA Information, blood fact sheet, incentive spirometry fact sheet Cain Sieve, RN  pre op nurse call if needed [phone]    FAILURE TO FOLLOW THESE INSTRUCTIONS MAY RESULT IN CANCELLATION OF YOUR SURGERY   Patient Signature: ___________________________________________

## 2012-10-11 ENCOUNTER — Encounter (HOSPITAL_COMMUNITY)
Admission: RE | Admit: 2012-10-11 | Discharge: 2012-10-11 | Disposition: A | Discharge status: Home / Self Care | Payer: Medicare Other | Source: Ambulatory Visit | Attending: Orthopedic Surgery | Admitting: Orthopedic Surgery

## 2012-10-11 ENCOUNTER — Ambulatory Visit (HOSPITAL_COMMUNITY)
Admission: RE | Admit: 2012-10-11 | Discharge: 2012-10-11 | Disposition: A | Discharge status: Home / Self Care | Payer: Medicare Other | Source: Ambulatory Visit | Attending: Orthopedic Surgery | Admitting: Orthopedic Surgery

## 2012-10-11 ENCOUNTER — Encounter (HOSPITAL_COMMUNITY): Payer: Self-pay

## 2012-10-11 DIAGNOSIS — Z8701 Personal history of pneumonia (recurrent): Secondary | ICD-10-CM | POA: Insufficient documentation

## 2012-10-11 DIAGNOSIS — Z01818 Encounter for other preprocedural examination: Secondary | ICD-10-CM | POA: Insufficient documentation

## 2012-10-11 HISTORY — DX: Anesthesia of skin: R20.0

## 2012-10-11 LAB — BASIC METABOLIC PANEL
CO2: 28 mEq/L (ref 19–32)
Calcium: 10.2 mg/dL (ref 8.4–10.5)
GFR calc Af Amer: >90 mL/min (ref >90)
GFR calc non Af Amer: >90 mL/min (ref >90)
Sodium: 138 mEq/L (ref 135–145)

## 2012-10-11 LAB — PROTIME-INR
INR: 0.97 (ref 0.00–1.49)
Prothrombin Time: 12.8 seconds (ref 11.6–15.2)

## 2012-10-11 LAB — URINALYSIS, ROUTINE W REFLEX MICROSCOPIC
Bilirubin Urine: NEGATIVE
Hgb urine dipstick: NEGATIVE
Protein, ur: NEGATIVE mg/dL
Urobilinogen, UA: 0.2 mg/dL (ref 0.0–1.0)

## 2012-10-11 LAB — CBC
MCH: 24.4 pg — ABNORMAL LOW (ref 26.0–34.0)
Platelets: 351 10*3/uL (ref 150–400)
RBC: 5.13 MIL/uL (ref 4.22–5.81)

## 2012-10-11 LAB — SURGICAL PCR SCREEN: Staphylococcus aureus: POSITIVE — AB

## 2012-10-11 NOTE — Progress Notes (Signed)
Stress test 09-20-12  Epic faxed bmet results to dr Charlann Boxer by epic

## 2012-10-13 NOTE — H&P (Signed)
TOTAL KNEE ADMISSION H&P  Patient is being admitted for right total knee arthroplasty.  Subjective:  Chief Complaint:   Right knee OA / pain.  HPI: William Norton, 67 y.o. male, has a history of pain and functional disability in the right knee due to arthritis and has failed non-surgical conservative treatments for greater than 12 weeks to includeNSAID's and/or analgesics, corticosteriod injections, supervised PT with diminished ADL's post treatment and activity modification.  Onset of symptoms was gradual, starting >10 years ago with gradually worsening course since that time. The patient noted prior procedures on the knee to include  menisectomy on the right knee(s).  Patient currently rates pain in the right knee(s) at 5 out of 10 with activity. Patient has night pain, worsening of pain with activity and weight bearing, pain that interferes with activities of daily living, pain with passive range of motion, crepitus and joint swelling.  Patient has evidence of periarticular osteophytes and joint space narrowing by imaging studies. There is no active signs of infection.  Risks, benefits and expectations were discussed with the patient. Patient understand the risks, benefits and expectations and wishes to proceed with surgery.   D/C Plans:   Home with HHPT  Post-op Meds:     Rx given for ASA, Zanaflex, Iron, Colace and MiraLax  Tranexamic Acid:   To be given  Decadron:    To be given  FYI:    ASA post-op   Patient Active Problem List   Diagnosis Date Noted  . Pre-operative cardiovascular examination 09/20/2012  . Barrett's esophagus 07/31/2012  . S/P right THA, AA 09/15/2011  . General medical examination 06/11/2011  . Prostate nodule 12/08/2010  . OSTEOARTHRITIS 08/08/2009  . DM 10/04/2008  . GERD 03/29/2007  . HYPERTENSION 02/28/2007   Past Medical History  Diagnosis Date  . Hypertension   . GERD (gastroesophageal reflux disease)   . DJD (degenerative joint disease)     hips,  knees  . Burn right hand    2nd degree per pt - healed per pt ,   . Barrett's esophagus 07/31/2012  . Numbness     more in left hand, some in right  . Pneumonia, community acquired 05/2012  . Hepatitis     hx of subclinical hepatatis- 40 years ago     Past Surgical History  Procedure Laterality Date  . Tonsillectomy    . Other surgical history      birthmark removed right upper arm as a child   . Knee surgery      right knee cartilage removed age 61 and at age 20   . Cervical laminectomy    . Eye surgery      bilateral cataract surgery   . Hernia repair      2010- right inguinal hernia repair   . Total hip arthroplasty  09/15/2011    Procedure: TOTAL HIP ARTHROPLASTY ANTERIOR APPROACH;  Surgeon: Shelda Pal, MD;  Location: [facility];  Service: Orthopedics;  Laterality: Right;  . Upper gastrointestinal endoscopy    . Hip arthroplasty  2011    left    No Known Allergies   History  Substance Use Topics  . Smoking status: Former Smoker -- 2.00 packs/day for 40 years    Quit date: 08/26/2011  . Smokeless tobacco: Never Used     Comment: quit 08-2011   . Alcohol Use: Yes     Comment: occasional     Family History  Problem Relation Age of Onset  .  Breast cancer Sister   . Throat cancer Father     died from  . Diabetes Mother   . Diabetes Mother   . Heart disease Mother      Review of Systems  Constitutional: Negative.  Negative for weight loss.  HENT: Positive for hearing loss and tinnitus.   Eyes: Negative.   Respiratory: Negative.   Cardiovascular: Negative.   Gastrointestinal: Positive for heartburn.  Genitourinary: Positive for frequency.  Musculoskeletal: Positive for myalgias and joint pain.  Skin: Positive for rash.  Neurological: Negative.   Endo/Heme/Allergies: Negative.   Psychiatric/Behavioral: Negative.     Objective:  Physical Exam  Constitutional: He is oriented to person, place, and time. He appears well-developed and well-nourished.  HENT:   Head: Normocephalic and atraumatic.  Mouth/Throat: Oropharynx is clear and moist.  Eyes: Pupils are equal, round, and reactive to light.  Neck: Neck supple. No JVD present. No tracheal deviation present. No thyromegaly present.  Cardiovascular: Normal rate, regular rhythm, normal heart sounds and intact distal pulses.   Respiratory: Effort normal and breath sounds normal. No stridor. No respiratory distress. He has no wheezes.  GI: Soft. There is no tenderness. There is no guarding.  Musculoskeletal:       Right knee: He exhibits decreased range of motion, swelling and bony tenderness. He exhibits no effusion, no ecchymosis, no deformity, no laceration and no erythema. Tenderness found.  Lymphadenopathy:    He has no cervical adenopathy.  Neurological: He is alert and oriented to person, place, and time.  Skin: Skin is warm and dry.  Psychiatric: He has a normal mood and affect.    Labs:  Estimated body mass index is 29.86 kg/(m^2) as calculated from the following:   Height as of 07/25/12: 5\' 11"  (1.803 m).   Weight as of 07/25/12: 97.07 kg (214 lb).   Imaging Review Plain radiographs demonstrate severe degenerative joint disease of the right knee(s). The overall alignment is neutral. The bone quality appears to be good for age and reported activity level.  Assessment/Plan:  End stage arthritis, right knee   The patient history, physical examination, clinical judgment of the provider and imaging studies are consistent with end stage degenerative joint disease of the right knee(s) and total knee arthroplasty is deemed medically necessary. The treatment options including medical management, injection therapy arthroscopy and arthroplasty were discussed at length. The risks and benefits of total knee arthroplasty were presented and reviewed. The risks due to aseptic loosening, infection, stiffness, patella tracking problems, thromboembolic complications and other imponderables were  discussed. The patient acknowledged the explanation, agreed to proceed with the plan and consent was signed. Patient is being admitted for inpatient treatment for surgery, pain control, PT, OT, prophylactic antibiotics, VTE prophylaxis, progressive ambulation and ADL's and discharge planning. The patient is planning to be discharged home with home health services .    Anastasio Auerbach Khya Halls   PAC  10/13/2012, 2:48 PM

## 2012-10-18 ENCOUNTER — Inpatient Hospital Stay (HOSPITAL_COMMUNITY): Payer: Medicare Other | Admitting: Anesthesiology

## 2012-10-18 ENCOUNTER — Encounter (HOSPITAL_COMMUNITY): Payer: Self-pay | Admitting: *Deleted

## 2012-10-18 ENCOUNTER — Encounter (HOSPITAL_COMMUNITY)
Admission: RE | Disposition: A | Discharge status: Home Health | Payer: Self-pay | Source: Ambulatory Visit | Attending: Orthopedic Surgery

## 2012-10-18 ENCOUNTER — Encounter (HOSPITAL_COMMUNITY): Payer: Self-pay | Admitting: Anesthesiology

## 2012-10-18 ENCOUNTER — Inpatient Hospital Stay (HOSPITAL_COMMUNITY)
Admission: RE | Admit: 2012-10-18 | Discharge: 2012-10-19 | DRG: 470 | Disposition: A | Discharge status: Home Health | Payer: Medicare Other | Source: Ambulatory Visit | Attending: Orthopedic Surgery | Admitting: Orthopedic Surgery

## 2012-10-18 DIAGNOSIS — Z96649 Presence of unspecified artificial hip joint: Secondary | ICD-10-CM

## 2012-10-18 DIAGNOSIS — Z96659 Presence of unspecified artificial knee joint: Secondary | ICD-10-CM

## 2012-10-18 DIAGNOSIS — E119 Type 2 diabetes mellitus without complications: Secondary | ICD-10-CM | POA: Diagnosis present

## 2012-10-18 DIAGNOSIS — Z87891 Personal history of nicotine dependence: Secondary | ICD-10-CM

## 2012-10-18 DIAGNOSIS — M1710 Unilateral primary osteoarthritis, unspecified knee: Principal | ICD-10-CM | POA: Diagnosis present

## 2012-10-18 DIAGNOSIS — K219 Gastro-esophageal reflux disease without esophagitis: Secondary | ICD-10-CM | POA: Diagnosis present

## 2012-10-18 DIAGNOSIS — D500 Iron deficiency anemia secondary to blood loss (chronic): Secondary | ICD-10-CM | POA: Diagnosis not present

## 2012-10-18 DIAGNOSIS — D62 Acute posthemorrhagic anemia: Secondary | ICD-10-CM | POA: Diagnosis not present

## 2012-10-18 DIAGNOSIS — I10 Essential (primary) hypertension: Secondary | ICD-10-CM | POA: Diagnosis present

## 2012-10-18 DIAGNOSIS — Z96651 Presence of right artificial knee joint: Secondary | ICD-10-CM

## 2012-10-18 HISTORY — PX: TOTAL KNEE ARTHROPLASTY: SHX125

## 2012-10-18 LAB — TYPE AND SCREEN

## 2012-10-18 SURGERY — ARTHROPLASTY, KNEE, TOTAL
Anesthesia: Monitor Anesthesia Care | Site: Knee | Laterality: Right | Wound class: Clean

## 2012-10-18 MED ORDER — FERROUS SULFATE 325 (65 FE) MG PO TABS
325.0000 mg | ORAL_TABLET | Freq: Three times a day (TID) | ORAL | Status: DC
  Administered 2012-10-18 – 2012-10-19 (×4): 325 mg via ORAL
  Filled 2012-10-18 (×6): qty 1

## 2012-10-18 MED ORDER — SODIUM CHLORIDE 0.9 % IR SOLN
Status: DC | PRN
  Administered 2012-10-18: 1000 mL

## 2012-10-18 MED ORDER — BUPIVACAINE-EPINEPHRINE PF 0.25-1:200000 % IJ SOLN
INTRAMUSCULAR | Status: DC | PRN
  Administered 2012-10-18: 30 mL

## 2012-10-18 MED ORDER — 0.9 % SODIUM CHLORIDE (POUR BTL) OPTIME
TOPICAL | Status: DC | PRN
  Administered 2012-10-18: 1000 mL

## 2012-10-18 MED ORDER — HYDROMORPHONE HCL PF 1 MG/ML IJ SOLN: 0.5000 mg | INTRAMUSCULAR | Status: DC | PRN

## 2012-10-18 MED ORDER — ONDANSETRON HCL 4 MG/2ML IJ SOLN
INTRAMUSCULAR | Status: DC | PRN
  Administered 2012-10-18: 4 mg via INTRAVENOUS

## 2012-10-18 MED ORDER — DEXAMETHASONE SODIUM PHOSPHATE 10 MG/ML IJ SOLN
10.0000 mg | Freq: Once | INTRAMUSCULAR | Status: AC
  Administered 2012-10-18: 10 mg via INTRAVENOUS

## 2012-10-18 MED ORDER — OMEPRAZOLE MAGNESIUM 20 MG PO TBEC
20.0000 mg | DELAYED_RELEASE_TABLET | Freq: Every day | ORAL | Status: DC
  Administered 2012-10-19: 20 mg via ORAL
  Filled 2012-10-18 (×2): qty 1

## 2012-10-18 MED ORDER — TRANEXAMIC ACID 100 MG/ML IV SOLN
1000.0000 mg | Freq: Once | INTRAVENOUS | Status: AC
  Administered 2012-10-18: 1000 mg via INTRAVENOUS
  Filled 2012-10-18: qty 10

## 2012-10-18 MED ORDER — DEXAMETHASONE SODIUM PHOSPHATE 10 MG/ML IJ SOLN
10.0000 mg | Freq: Once | INTRAMUSCULAR | Status: DC
  Filled 2012-10-18: qty 1

## 2012-10-18 MED ORDER — METHOCARBAMOL 100 MG/ML IJ SOLN
500.0000 mg | Freq: Four times a day (QID) | INTRAVENOUS | Status: DC | PRN
  Filled 2012-10-18: qty 5

## 2012-10-18 MED ORDER — PROPOFOL INFUSION 10 MG/ML OPTIME
INTRAVENOUS | Status: DC | PRN
  Administered 2012-10-18: 75 ug/kg/min via INTRAVENOUS

## 2012-10-18 MED ORDER — CEFAZOLIN SODIUM-DEXTROSE 2-3 GM-% IV SOLR
2.0000 g | Freq: Four times a day (QID) | INTRAVENOUS | Status: AC
  Administered 2012-10-18 (×2): 2 g via INTRAVENOUS
  Filled 2012-10-18 (×2): qty 50

## 2012-10-18 MED ORDER — FLEET ENEMA 7-19 GM/118ML RE ENEM: 1.0000 | ENEMA | Freq: Once | RECTAL | Status: AC | PRN

## 2012-10-18 MED ORDER — ALUM & MAG HYDROXIDE-SIMETH 200-200-20 MG/5ML PO SUSP
30.0000 mL | ORAL | Status: DC | PRN
  Administered 2012-10-19: 30 mL via ORAL
  Filled 2012-10-18: qty 30

## 2012-10-18 MED ORDER — MENTHOL 3 MG MT LOZG
1.0000 | LOZENGE | OROMUCOSAL | Status: DC | PRN
  Filled 2012-10-18: qty 9

## 2012-10-18 MED ORDER — METHOCARBAMOL 500 MG PO TABS
500.0000 mg | ORAL_TABLET | Freq: Four times a day (QID) | ORAL | Status: DC | PRN
  Administered 2012-10-18 (×2): 500 mg via ORAL
  Filled 2012-10-18 (×2): qty 1

## 2012-10-18 MED ORDER — LIDOCAINE HCL (CARDIAC) 20 MG/ML IV SOLN
INTRAVENOUS | Status: DC | PRN
  Administered 2012-10-18: 100 mg via INTRAVENOUS

## 2012-10-18 MED ORDER — CEFAZOLIN SODIUM-DEXTROSE 2-3 GM-% IV SOLR
2.0000 g | INTRAVENOUS | Status: AC
  Administered 2012-10-18: 2 g via INTRAVENOUS

## 2012-10-18 MED ORDER — MIDAZOLAM HCL 5 MG/5ML IJ SOLN
INTRAMUSCULAR | Status: DC | PRN
  Administered 2012-10-18: 2 mg via INTRAVENOUS

## 2012-10-18 MED ORDER — BUPIVACAINE-EPINEPHRINE PF 0.25-1:200000 % IJ SOLN
INTRAMUSCULAR | Status: DC | PRN
  Administered 2012-10-18: 25 mL

## 2012-10-18 MED ORDER — SODIUM CHLORIDE 0.9 % IV SOLN
INTRAVENOUS | Status: DC
  Administered 2012-10-18: 13:00:00 via INTRAVENOUS
  Filled 2012-10-18 (×5): qty 1000

## 2012-10-18 MED ORDER — METOCLOPRAMIDE HCL 5 MG/ML IJ SOLN: 5.0000 mg | Freq: Three times a day (TID) | INTRAMUSCULAR | Status: DC | PRN

## 2012-10-18 MED ORDER — METOCLOPRAMIDE HCL 10 MG PO TABS: 5.0000 mg | ORAL_TABLET | Freq: Three times a day (TID) | ORAL | Status: DC | PRN

## 2012-10-18 MED ORDER — KETOROLAC TROMETHAMINE 30 MG/ML IJ SOLN
INTRAMUSCULAR | Status: DC | PRN
  Administered 2012-10-18: 30 mg via INTRAVENOUS

## 2012-10-18 MED ORDER — BUPIVACAINE LIPOSOME 1.3 % IJ SUSP
20.0000 mL | Freq: Once | INTRAMUSCULAR | Status: DC
  Filled 2012-10-18 (×2): qty 20

## 2012-10-18 MED ORDER — HYDROCODONE-ACETAMINOPHEN 7.5-325 MG PO TABS
1.0000 | ORAL_TABLET | ORAL | Status: DC
  Administered 2012-10-18 – 2012-10-19 (×7): 2 via ORAL
  Filled 2012-10-18 (×7): qty 2

## 2012-10-18 MED ORDER — ONDANSETRON HCL 4 MG PO TABS: 4.0000 mg | ORAL_TABLET | Freq: Four times a day (QID) | ORAL | Status: DC | PRN

## 2012-10-18 MED ORDER — BUPIVACAINE LIPOSOME 1.3 % IJ SUSP
INTRAMUSCULAR | Status: DC | PRN
  Administered 2012-10-18: 20 mL

## 2012-10-18 MED ORDER — ONDANSETRON HCL 4 MG/2ML IJ SOLN: 4.0000 mg | Freq: Four times a day (QID) | INTRAMUSCULAR | Status: DC | PRN

## 2012-10-18 MED ORDER — ASPIRIN EC 325 MG PO TBEC
325.0000 mg | DELAYED_RELEASE_TABLET | Freq: Two times a day (BID) | ORAL | Status: DC
  Administered 2012-10-19: 325 mg via ORAL
  Filled 2012-10-18 (×3): qty 1

## 2012-10-18 MED ORDER — CELECOXIB 200 MG PO CAPS
200.0000 mg | ORAL_CAPSULE | Freq: Two times a day (BID) | ORAL | Status: DC
  Administered 2012-10-18 – 2012-10-19 (×2): 200 mg via ORAL
  Filled 2012-10-18 (×3): qty 1

## 2012-10-18 MED ORDER — POLYETHYLENE GLYCOL 3350 17 G PO PACK
17.0000 g | PACK | Freq: Two times a day (BID) | ORAL | Status: DC
  Administered 2012-10-18 – 2012-10-19 (×3): 17 g via ORAL

## 2012-10-18 MED ORDER — PHENOL 1.4 % MT LIQD: 1.0000 | OROMUCOSAL | Status: DC | PRN

## 2012-10-18 MED ORDER — FENTANYL CITRATE 0.05 MG/ML IJ SOLN
INTRAMUSCULAR | Status: DC | PRN
  Administered 2012-10-18: 50 ug via INTRAVENOUS

## 2012-10-18 MED ORDER — BUPIVACAINE HCL (PF) 0.75 % IJ SOLN
INTRAMUSCULAR | Status: DC | PRN
  Administered 2012-10-18: 1.8 mL via INTRATHECAL

## 2012-10-18 MED ORDER — DOCUSATE SODIUM 100 MG PO CAPS
100.0000 mg | ORAL_CAPSULE | Freq: Two times a day (BID) | ORAL | Status: DC
  Administered 2012-10-18 – 2012-10-19 (×2): 100 mg via ORAL

## 2012-10-18 MED ORDER — SODIUM CHLORIDE 0.9 % IJ SOLN
INTRAMUSCULAR | Status: DC | PRN
  Administered 2012-10-18: 14 mL

## 2012-10-18 MED ORDER — LACTATED RINGERS IV SOLN
INTRAVENOUS | Status: DC | PRN
  Administered 2012-10-18: 08:00:00 via INTRAVENOUS

## 2012-10-18 MED ORDER — PROMETHAZINE HCL 25 MG/ML IJ SOLN: 6.2500 mg | INTRAMUSCULAR | Status: DC | PRN

## 2012-10-18 MED ORDER — DIPHENHYDRAMINE HCL 25 MG PO CAPS: 25.0000 mg | ORAL_CAPSULE | Freq: Four times a day (QID) | ORAL | Status: DC | PRN

## 2012-10-18 MED ORDER — KETOROLAC TROMETHAMINE 30 MG/ML IJ SOLN
INTRAMUSCULAR | Status: DC | PRN
  Administered 2012-10-18: 30 mg

## 2012-10-18 MED ORDER — ZOLPIDEM TARTRATE 5 MG PO TABS: 5.0000 mg | ORAL_TABLET | Freq: Every evening | ORAL | Status: DC | PRN

## 2012-10-18 MED ORDER — BISACODYL 10 MG RE SUPP: 10.0000 mg | Freq: Every day | RECTAL | Status: DC | PRN

## 2012-10-18 SURGICAL SUPPLY — 58 items
ADH SKN CLS APL DERMABOND .7 (GAUZE/BANDAGES/DRESSINGS) ×1
BAG SPEC THK2 15X12 ZIP CLS (MISCELLANEOUS) ×1
BAG ZIPLOCK 12X15 (MISCELLANEOUS) ×2 IMPLANT
BANDAGE ELASTIC 6 VELCRO ST LF (GAUZE/BANDAGES/DRESSINGS) ×2 IMPLANT
BANDAGE ESMARK 6X9 LF (GAUZE/BANDAGES/DRESSINGS) ×1 IMPLANT
BLADE SAW SGTL 13.0X1.19X90.0M (BLADE) ×2 IMPLANT
BNDG CMPR 9X6 STRL LF SNTH (GAUZE/BANDAGES/DRESSINGS) ×1
BNDG ESMARK 6X9 LF (GAUZE/BANDAGES/DRESSINGS) ×2
BOWL SMART MIX CTS (DISPOSABLE) ×2 IMPLANT
CAPT RP KNEE ×1 IMPLANT
CEMENT HV SMART SET (Cement) ×2 IMPLANT
CLOTH BEACON ORANGE TIMEOUT ST (SAFETY) ×2 IMPLANT
CUFF TOURN SGL QUICK 34 (TOURNIQUET CUFF) ×2
CUFF TRNQT CYL 34X4X40X1 (TOURNIQUET CUFF) ×1 IMPLANT
DECANTER SPIKE VIAL GLASS SM (MISCELLANEOUS) ×3 IMPLANT
DERMABOND ADVANCED (GAUZE/BANDAGES/DRESSINGS) ×1
DERMABOND ADVANCED .7 DNX12 (GAUZE/BANDAGES/DRESSINGS) ×1 IMPLANT
DRAPE EXTREMITY T 121X128X90 (DRAPE) ×2 IMPLANT
DRAPE POUCH INSTRU U-SHP 10X18 (DRAPES) ×2 IMPLANT
DRAPE U-SHAPE 47X51 STRL (DRAPES) ×2 IMPLANT
DRSG AQUACEL AG ADV 3.5X10 (GAUZE/BANDAGES/DRESSINGS) ×2 IMPLANT
DRSG TEGADERM 4X4.75 (GAUZE/BANDAGES/DRESSINGS) ×2 IMPLANT
DURAPREP 26ML APPLICATOR (WOUND CARE) ×2 IMPLANT
ELECT REM PT RETURN 9FT ADLT (ELECTROSURGICAL) ×2
ELECTRODE REM PT RTRN 9FT ADLT (ELECTROSURGICAL) ×1 IMPLANT
EVACUATOR 1/8 PVC DRAIN (DRAIN) ×2 IMPLANT
FACESHIELD LNG OPTICON STERILE (SAFETY) ×10 IMPLANT
GAUZE SPONGE 2X2 8PLY STRL LF (GAUZE/BANDAGES/DRESSINGS) ×1 IMPLANT
GLOVE BIOGEL PI IND STRL 7.5 (GLOVE) ×1 IMPLANT
GLOVE BIOGEL PI IND STRL 8 (GLOVE) ×1 IMPLANT
GLOVE BIOGEL PI INDICATOR 7.5 (GLOVE) ×1
GLOVE BIOGEL PI INDICATOR 8 (GLOVE) ×1
GLOVE ECLIPSE 8.0 STRL XLNG CF (GLOVE) ×2 IMPLANT
GLOVE ORTHO TXT STRL SZ7.5 (GLOVE) ×4 IMPLANT
GOWN BRE IMP PREV XXLGXLNG (GOWN DISPOSABLE) ×2 IMPLANT
GOWN STRL NON-REIN LRG LVL3 (GOWN DISPOSABLE) ×2 IMPLANT
HANDPIECE INTERPULSE COAX TIP (DISPOSABLE) ×2
KIT BASIN OR (CUSTOM PROCEDURE TRAY) ×2 IMPLANT
MANIFOLD NEPTUNE II (INSTRUMENTS) ×2 IMPLANT
NDL SAFETY ECLIPSE 18X1.5 (NEEDLE) ×1 IMPLANT
NEEDLE HYPO 18GX1.5 SHARP (NEEDLE) ×4
NS IRRIG 1000ML POUR BTL (IV SOLUTION) ×4 IMPLANT
PACK TOTAL JOINT (CUSTOM PROCEDURE TRAY) ×2 IMPLANT
POSITIONER SURGICAL ARM (MISCELLANEOUS) ×2 IMPLANT
SET HNDPC FAN SPRY TIP SCT (DISPOSABLE) ×1 IMPLANT
SET PAD KNEE POSITIONER (MISCELLANEOUS) ×2 IMPLANT
SPONGE GAUZE 2X2 STER 10/PKG (GAUZE/BANDAGES/DRESSINGS) ×1
SUCTION FRAZIER 12FR DISP (SUCTIONS) ×2 IMPLANT
SUT MNCRL AB 4-0 PS2 18 (SUTURE) ×2 IMPLANT
SUT VIC AB 1 CT1 36 (SUTURE) ×2 IMPLANT
SUT VIC AB 2-0 CT1 27 (SUTURE) ×6
SUT VIC AB 2-0 CT1 TAPERPNT 27 (SUTURE) ×3 IMPLANT
SUT VLOC 180 0 24IN GS25 (SUTURE) ×2 IMPLANT
SYR 50ML LL SCALE MARK (SYRINGE) ×3 IMPLANT
TOWEL OR 17X26 10 PK STRL BLUE (TOWEL DISPOSABLE) ×4 IMPLANT
TRAY FOLEY CATH 14FRSI W/METER (CATHETERS) ×2 IMPLANT
WATER STERILE IRR 1500ML POUR (IV SOLUTION) ×3 IMPLANT
WRAP KNEE MAXI GEL POST OP (GAUZE/BANDAGES/DRESSINGS) ×2 IMPLANT

## 2012-10-18 NOTE — Interval H&P Note (Signed)
History and Physical Interval Note:  10/18/2012 6:55 AM  William Norton  has presented today for surgery, with the diagnosis of right knee osteoarthritis  The various methods of treatment have been discussed with the patient and family. After consideration of risks, benefits and other options for treatment, the patient has consented to  Procedure(s): RIGHT TOTAL KNEE ARTHROPLASTY (Right) as a surgical intervention .  The patient's history has been reviewed, patient examined, no change in status, stable for surgery.  I have reviewed the patient's chart and labs.  Questions were answered to the patient's satisfaction.     Shelda Pal

## 2012-10-18 NOTE — Care Management Note (Signed)
    Page 1 of 1   10/18/2012     1:10:26 PM   CARE MANAGEMENT NOTE 10/18/2012  Patient:  William Norton, William Norton   Account Number:  [id number]  Date Initiated:  10/18/2012  Documentation initiated by:  Lorenda Ishihara  Subjective/Objective Assessment:   67 yo male admitted s/p Right total knee replacement. PTA lived at home with spouse.     Action/Plan:   Home when stable   Anticipated DC Date:  10/20/2012   Anticipated DC Plan:  HOME W HOME HEALTH SERVICES      DC Planning Services  CM consult      [facility] Choice  HOME HEALTH   Choice offered to / List presented to:  C-1 Patient        HH arranged  HH-2 PT      [facility] agency  [facility]   Status of service:  Completed, signed off Medicare Important Message given?   (If response is "NO", the following Medicare IM given date fields will be blank) Date Medicare IM given:   Date Additional Medicare IM given:    Discharge Disposition:  HOME W HOME HEALTH SERVICES  Per UR Regulation:  Reviewed for med. necessity/level of care/duration of stay  If discussed at Long Length of Stay Meetings, dates discussed:    Comments:

## 2012-10-18 NOTE — Anesthesia Postprocedure Evaluation (Signed)
  Anesthesia Post-op Note  Patient: William Norton  Procedure(s) Performed: Procedure(s) (LRB): RIGHT TOTAL KNEE ARTHROPLASTY (Right)  Patient Location: PACU  Anesthesia Type: Spinal  Level of Consciousness: awake and alert   Airway and Oxygen Therapy: Patient Spontanous Breathing  Post-op Pain: mild  Post-op Assessment: Post-op Vital signs reviewed, Patient's Cardiovascular Status Stable, Respiratory Function Stable, Patent Airway and No signs of Nausea or vomiting  Last Vitals:  Filed Vitals:   10/18/12 1045  BP: 111/78  Pulse: 63  Temp: 36.4 C  Resp: 12    Post-op Vital Signs: stable   Complications: No apparent anesthesia complications

## 2012-10-18 NOTE — Transfer of Care (Signed)
Immediate Anesthesia Transfer of Care Note  Patient: William Norton  Procedure(s) Performed: Procedure(s): RIGHT TOTAL KNEE ARTHROPLASTY (Right)  Patient Location: PACU  Anesthesia Type:MAC and Spinal  Level of Consciousness: awake, alert , oriented and patient cooperative  Airway & Oxygen Therapy: Patient Spontanous Breathing and Patient connected to face mask oxygen  Post-op Assessment: Report given to PACU RN and Post -op Vital signs reviewed and stable  Post vital signs: Reviewed and stable  Complications: No apparent anesthesia complications

## 2012-10-18 NOTE — Op Note (Signed)
NAME:  William Norton                      MEDICAL RECORD NO.:  [id number]                             FACILITY:  [facility]      PHYSICIAN:  Madlyn Frankel. Charlann Boxer, M.D.  DATE OF BIRTH:  09/23/1945      DATE OF PROCEDURE:  10/18/2012                                     OPERATIVE REPORT         PREOPERATIVE DIAGNOSIS:  Right knee osteoarthritis.      POSTOPERATIVE DIAGNOSIS:  Right knee osteoarthritis.      FINDINGS:  The patient was noted to have complete loss of cartilage and   bone-on-bone arthritis with associated osteophytes in all three compartments of   the knee with a significant synovitis and associated effusion.      PROCEDURE:  Right total knee replacement.      COMPONENTS USED:  DePuy rotating platform posterior stabilized knee   system, a size 5 femur, 4 tibia, 12.5 mm PS insert, and 41 patellar   button.      SURGEON:  Madlyn Frankel. Charlann Boxer, M.D.      ASSISTANT:  Lanney Gins, PA-C.      ANESTHESIA:  Spinal.      SPECIMENS:  None.      COMPLICATION:  None.      DRAINS:  One Hemovac.  EBL: <150cc      TOURNIQUET TIME:   Total Tourniquet Time Documented: Thigh (Right) - 50 minutes Total: Thigh (Right) - 50 minutes  .      The patient was stable to the recovery room.      INDICATION FOR PROCEDURE:  William Norton is a 67 y.o. male patient of   mine.  The patient had been seen, evaluated, and treated conservatively in the   office with medication, activity modification, and injections.  The patient had   radiographic changes of bone-on-bone arthritis with endplate sclerosis and osteophytes noted.      The patient failed conservative measures including medication, injections, and activity modification, and at this point was ready for more definitive measures.   Based on the radiographic changes and failed conservative measures, the patient   decided to proceed with total knee replacement.  Risks of infection,   DVT, component failure, need for revision surgery, postop  course, and   expectations were all   discussed and reviewed.  Consent was obtained for benefit of pain   relief.      PROCEDURE IN DETAIL:  The patient was brought to the operative theater.   Once adequate anesthesia, preoperative antibiotics, 2 gm of Ancef administered, the patient was positioned supine with the right thigh tourniquet placed.  The  right lower extremity was prepped and draped in sterile fashion.  A time-   out was performed identifying the patient, planned procedure, and   extremity.      The right lower extremity was placed in the [facility] leg holder.  The leg was   exsanguinated, tourniquet elevated to 250 mmHg.  A midline incision was   made followed by median parapatellar arthrotomy.  Following initial   exposure, attention was first directed  to the patella.  Precut   measurement was noted to be 26 mm.  I resected down to 15 mm and used a   41 patellar button to restore patellar height as well as cover the cut   surface.      The lug holes were drilled and a metal shim was placed to protect the   patella from retractors and saw blades.      At this point, attention was now directed to the femur.  The femoral   canal was opened with a drill, irrigated to try to prevent fat emboli.  An   intramedullary rod was passed at 5 degrees valgus, 11 mm of bone was   resected off the distal femur due to flexion contracture pre-operatively.  Following this resection, the tibia was   subluxated anteriorly.  Using the extramedullary guide, 2-3 mm of bone was resected off   the proximal medial tibia.  We confirmed the gap would be   stable medially and laterally with a 10 mm insert as well as confirmed   the cut was perpendicular in the coronal plane, checking with an alignment rod.      Once this was done, I sized the femur to be a size 5 in the anterior-   posterior dimension, chose a standard component based on medial and   lateral dimension.  The size 5 rotation block was  then pinned in   position anterior referenced using the C-clamp to set rotation.  The   anterior, posterior, and  chamfer cuts were made without difficulty nor   notching making certain that I was along the anterior cortex to help   with flexion gap stability.      The final box cut was made off the lateral aspect of distal femur.      At this point, the tibia was sized to be a size 4, the size 4 tray was   then pinned in position through the medial third of the tubercle,   drilled, and keel punched.  Trial reduction was now carried with a 5 femur,  4 tibia, a 12.5 mm insert, and the 41 patella botton.  The knee was brought to   extension, full extension with good flexion stability with the patella   tracking through the trochlea without application of pressure.  Given   all these findings, the trial components removed.  Final components were   opened and cement was mixed.  The knee was irrigated with normal saline   solution and pulse lavage.  The synovial lining was   then injected with 0.25% Marcaine with epinephrine and 1 cc of Toradol,   total of 61 cc.      The knee was irrigated.  Final implants were then cemented onto clean and   dried cut surfaces of bone with the knee brought to extension with a 12.5 mm trial insert.      Once the cement had fully cured, the excess cement was removed   throughout the knee.  I confirmed I was satisfied with the range of   motion and stability, and the final 12.5 mm PS insert was chosen.  It was   placed into the knee.      The tourniquet had been let down at 50 minutes.  No significant   hemostasis required.  The medium Hemovac drain was placed deep.  The   extensor mechanism was then reapproximated using #1 Vicryl with the knee   in flexion.  The   remaining wound was closed with 2-0 Vicryl and running 4-0 Monocryl.   The knee was cleaned, dried, dressed sterilely using Dermabond and   Aquacel dressing.  Drain site dressed separately.  The  patient was then   brought to recovery room in stable condition, tolerating the procedure   well.   Please note that Physician Assistant, Lanney Gins, was present for the entirety of the case, and was utilized for pre-operative positioning, peri-operative retractor management, general facilitation of the procedure.  He was also utilized for primary wound closure at the end of the case.              Madlyn Frankel Charlann Boxer, M.D.

## 2012-10-18 NOTE — Anesthesia Procedure Notes (Signed)
Spinal  Patient location during procedure: OR Staffing Performed by: anesthesiologist  Preanesthetic Checklist Completed: patient identified, site marked, surgical consent, pre-op evaluation, timeout performed, IV checked, risks and benefits discussed and monitors and equipment checked Spinal Block Patient position: sitting Prep: Betadine Patient monitoring: heart rate, continuous pulse ox and blood pressure Location: L4-5 Injection technique: single-shot Needle Needle type: Sprotte  Needle gauge: 24 G Needle length: 9 cm Additional Notes Expiration date of kit checked and confirmed. Patient tolerated procedure well, without complications.     

## 2012-10-18 NOTE — Evaluation (Signed)
Physical Therapy Evaluation Patient Details Name: William Norton MRN: [id number] DOB: 1945/08/28 Today's Date: 10/18/2012 Time: 1629-1700 PT Time Calculation (min): 31 min  PT Assessment / Plan / Recommendation Clinical Impression  Pt presents s/p R TKA POD 0 with decreased strength, ROM and mobility.   Tolerated OOB and ambulation in hallway very well with min assist and RW.  Pt will benefit from skilled PT in acute venue to address deficits.  PT recommends HHPT for follow up at D/c to maximize pts safety and function.     PT Assessment  Patient needs continued PT services    Follow Up Recommendations  Home health PT    Does the patient have the potential to tolerate intense rehabilitation      Barriers to Discharge        Equipment Recommendations  None recommended by PT    Recommendations for Other Services     Frequency 7X/week    Precautions / Restrictions Precautions Precautions: Knee Restrictions Weight Bearing Restrictions: No Other Position/Activity Restrictions: WBAT   Pertinent Vitals/Pain 4/10      Mobility  Bed Mobility Bed Mobility: Supine to Sit Supine to Sit: 5: Supervision Details for Bed Mobility Assistance: Supervision for safety with min cues for technique.  Transfers Transfers: Sit to Stand;Stand to Sit Sit to Stand: 4: Min guard;From elevated surface;From bed Stand to Sit: 4: Min guard;With upper extremity assist;To chair/3-in-1 Details for Transfer Assistance: Min/guard for safety with cues for hand placement and LE management.  Ambulation/Gait Ambulation/Gait Assistance: 4: Min assist Ambulation Distance (Feet): 55 Feet Assistive device: Rolling walker Ambulation/Gait Assistance Details: cues for sequencing/technique with RW and to maintain upright posture throughout.  Gait Pattern: Step-to pattern;Decreased stride length;Antalgic Gait velocity: decreased    Exercises Total Joint Exercises Ankle Circles/Pumps: AROM;Both;20 reps Quad  Sets: AROM;Right;10 reps   PT Diagnosis: Difficulty walking;Generalized weakness;Acute pain  PT Problem List: Decreased strength;Decreased range of motion;Decreased activity tolerance;Decreased balance;Decreased mobility;Decreased knowledge of use of DME;Decreased safety awareness;Pain;Decreased knowledge of precautions PT Treatment Interventions: DME instruction;Gait training;Stair training;Functional mobility training;Therapeutic activities;Therapeutic exercise;Balance training;Patient/family education     PT Goals(Current goals can be found in the care plan section) Acute Rehab PT Goals Patient Stated Goal: To get back to my life PT Goal Formulation: With patient Time For Goal Achievement: 10/21/12 Potential to Achieve Goals: Good  Visit Information  Last PT Received On: 10/18/12 Assistance Needed: +1       Prior Functioning  Home Living Family/patient expects to be discharged to:: Private residence Living Arrangements: Spouse/significant other Available Help at Discharge: Spouse/Significant other;Available 24 hours/day Type of Home: House (townhouse) Home Access: Stairs to enter [employer] of Steps: 1 then 1  Home Layout: One level Home Equipment: Walker - 2 wheels;Crutches;Bedside commode;Cane - single point Prior Function Level of Independence: Independent Communication Communication: No difficulties    Cognition  Cognition Arousal/Alertness: Awake/alert Behavior During Therapy: WFL for tasks assessed/performed Overall Cognitive Status: Within Functional Limits for tasks assessed    Extremity/Trunk Assessment Lower Extremity Assessment Lower Extremity Assessment: RLE deficits/detail RLE Deficits / Details: able to perform SLR without assit from therapist Cervical / Trunk Assessment Cervical / Trunk Assessment: Normal   Balance    End of Session PT - End of Session Equipment Utilized During Treatment: Gait belt Activity Tolerance: Patient tolerated  treatment well Patient left: in chair;with call bell/phone within reach Nurse Communication: Mobility status  GP     Vista Deck 10/18/2012, 5:08 PM

## 2012-10-18 NOTE — Anesthesia Preprocedure Evaluation (Addendum)
Anesthesia Evaluation  Patient identified by MRN, date of birth, ID band Patient awake    Reviewed: Allergy & Precautions, H&P , NPO status , Patient's Chart, lab work & pertinent test results  History of Anesthesia Complications (+) AWARENESS UNDER ANESTHESIA  Airway Mallampati: II TM Distance: >3 FB Neck ROM: Full    Dental no notable dental hx.    Pulmonary former smoker,  breath sounds clear to auscultation  Pulmonary exam normal       Cardiovascular hypertension, Pt. on medications Rhythm:Regular Rate:Normal     Neuro/Psych negative neurological ROS  negative psych ROS   GI/Hepatic Neg liver ROS, Barrett's esophagus 07/31/2012    Endo/Other  negative endocrine ROS  Renal/GU negative Renal ROS  negative genitourinary   Musculoskeletal negative musculoskeletal ROS (+)   Abdominal   Peds negative pediatric ROS (+)  Hematology negative hematology ROS (+)   Anesthesia Other Findings   Reproductive/Obstetrics negative OB ROS                          Anesthesia Physical Anesthesia Plan  ASA: II  Anesthesia Plan: MAC   Post-op Pain Management:    Induction: Intravenous  Airway Management Planned: Nasal Cannula  Additional Equipment:   Intra-op Plan:   Post-operative Plan:   Informed Consent: I have reviewed the patients History and Physical, chart, labs and discussed the procedure including the risks, benefits and alternatives for the proposed anesthesia with the patient or authorized representative who has indicated his/her understanding and acceptance.     Plan Discussed with: CRNA and Surgeon  Anesthesia Plan Comments:         Anesthesia Quick Evaluation

## 2012-10-18 NOTE — Progress Notes (Signed)
Utilization review completed.  

## 2012-10-18 NOTE — Preoperative (Signed)
Beta Blockers   Reason not to administer Beta Blockers:Not Applicable 

## 2012-10-19 ENCOUNTER — Encounter (HOSPITAL_COMMUNITY): Payer: Self-pay | Admitting: Orthopedic Surgery

## 2012-10-19 DIAGNOSIS — D500 Iron deficiency anemia secondary to blood loss (chronic): Secondary | ICD-10-CM | POA: Diagnosis not present

## 2012-10-19 LAB — BASIC METABOLIC PANEL WITH GFR
BUN: 22 mg/dL (ref 6–23)
CO2: 21 meq/L (ref 19–32)
Calcium: 8.5 mg/dL (ref 8.4–10.5)
Chloride: 103 meq/L (ref 96–112)
Creatinine, Ser: 0.74 mg/dL (ref 0.50–1.35)
GFR calc Af Amer: >90 mL/min
GFR calc non Af Amer: >90 mL/min
Glucose, Bld: 128 mg/dL — ABNORMAL HIGH (ref 70–99)
Potassium: 4.5 meq/L (ref 3.5–5.1)
Sodium: 132 meq/L — ABNORMAL LOW (ref 135–145)

## 2012-10-19 LAB — CBC
HCT: 29 % — ABNORMAL LOW (ref 39.0–52.0)
Hemoglobin: 9.4 g/dL — ABNORMAL LOW (ref 13.0–17.0)
MCV: 74.4 fL — ABNORMAL LOW (ref 78.0–100.0)
RDW: 16.1 % — ABNORMAL HIGH (ref 11.5–15.5)
WBC: 14.2 10*3/uL — ABNORMAL HIGH (ref 4.0–10.5)

## 2012-10-19 MED ORDER — TIZANIDINE HCL 4 MG PO CAPS: 4.0000 mg | ORAL_CAPSULE | Freq: Three times a day (TID) | ORAL | Status: DC | PRN

## 2012-10-19 MED ORDER — HYDROCODONE-ACETAMINOPHEN 7.5-325 MG PO TABS: 1.0000 | ORAL_TABLET | ORAL | Status: DC | PRN

## 2012-10-19 MED ORDER — FERROUS SULFATE 325 (65 FE) MG PO TABS: 325.0000 mg | ORAL_TABLET | Freq: Three times a day (TID) | ORAL | Status: DC

## 2012-10-19 MED ORDER — POLYETHYLENE GLYCOL 3350 17 G PO PACK: 17.0000 g | PACK | Freq: Two times a day (BID) | ORAL | Status: DC

## 2012-10-19 MED ORDER — ASPIRIN 325 MG PO TBEC: 325.0000 mg | DELAYED_RELEASE_TABLET | Freq: Two times a day (BID) | ORAL | Status: AC

## 2012-10-19 MED ORDER — DSS 100 MG PO CAPS: 100.0000 mg | ORAL_CAPSULE | Freq: Two times a day (BID) | ORAL | Status: DC

## 2012-10-19 NOTE — Progress Notes (Signed)
   Subjective: 1 Day Post-Op Procedure(s) (LRB): RIGHT TOTAL KNEE ARTHROPLASTY (Right)   Patient reports pain as mild, pain well controlled. No events throughout the night. Ready to be discharged home.  Objective:   VITALS:   Filed Vitals:   10/19/12  BP: 118/74  Pulse: 66  Temp: 97.9 F (36.6 C)  Resp: 18    Neurovascular intact Dorsiflexion/Plantar flexion intact Incision: dressing C/D/I No cellulitis present Compartment soft  LABS  Recent Labs  10/19/12 0415  HGB 9.4*  HCT 29.0*  WBC 14.2*  PLT 239     Recent Labs  10/19/12 0415  NA 132*  K 4.5  BUN 22  CREATININE 0.74  GLUCOSE 128*     Assessment/Plan: 1 Day Post-Op Procedure(s) (LRB): RIGHT TOTAL KNEE ARTHROPLASTY (Right) HV drain d/c'ed Foley cath d/c'ed Advance diet Up with therapy D/C IV fluids Discharge home with home health  Expected ABLA  Treated with iron and will observe  Overweight (BMI 25-29.9) Estimated body mass index is 29.21 kg/(m^2) as calculated from the following:   Height as of this encounter: 5' 11.5" (1.816 m).   Weight as of this encounter: 96.344 kg (212 lb 6.4 oz). Patient also counseled that weight may inhibit the healing process Patient counseled that losing weight will help with future health issues     Anastasio Auerbach. Chalmers Iddings   PAC  10/19/2012, 8:20 AM

## 2012-10-19 NOTE — Discharge Summary (Signed)
Physician Discharge Summary  Patient ID: CADAN MAGGART MRN: [id number] DOB/AGE: 68/18/1947 68 y.o.  Admit date: 10/18/2012 Discharge date: 10/19/2012   Procedures:  Procedure(s) (LRB): RIGHT TOTAL KNEE ARTHROPLASTY (Right)  Attending Physician:  Dr. Durene Romans   Admission Diagnoses:   Right knee OA / pain  Discharge Diagnoses:  Principal Problem:   S/P right TKA Active Problems:   Expected blood loss anemia  Past Medical History  Diagnosis Date  . Hypertension   . GERD (gastroesophageal reflux disease)   . DJD (degenerative joint disease)     hips, knees  . Burn right hand    2nd degree per pt - healed per pt ,   . Barrett's esophagus 07/31/2012  . Numbness     more in left hand, some in right  . Pneumonia, community acquired 05/2012  . Hepatitis     hx of subclinical hepatatis- 40 years ago     HPI:    William Norton, 67 y.o. male, has a history of pain and functional disability in the right knee due to arthritis and has failed non-surgical conservative treatments for greater than 12 weeks to includeNSAID's and/or analgesics, corticosteriod injections, supervised PT with diminished ADL's post treatment and activity modification. Onset of symptoms was gradual, starting >10 years ago with gradually worsening course since that time. The patient noted prior procedures on the knee to include menisectomy on the right knee(s). Patient currently rates pain in the right knee(s) at 5 out of 10 with activity. Patient has night pain, worsening of pain with activity and weight bearing, pain that interferes with activities of daily living, pain with passive range of motion, crepitus and joint swelling. Patient has evidence of periarticular osteophytes and joint space narrowing by imaging studies. There is no active signs of infection. Risks, benefits and expectations were discussed with the patient. Patient understand the risks, benefits and expectations and wishes to proceed with surgery.     PCP: Willow Ora, MD   Discharged Condition: good  Hospital Course:  Patient underwent the above stated procedure on 10/18/2012. Patient tolerated the procedure well and brought to the recovery room in good condition and subsequently to the floor.  POD #1 BP: 118/74 ; Pulse: 66 ; Temp: 97.9 F (36.6 C) ; Resp: 18  Pt's foley was removed, as well as the hemovac drain removed. IV was changed to a saline lock. Patient reports pain as mild, pain well controlled. No events throughout the night. Ready to be discharged home. Neurovascular intact, dorsiflexion/plantar flexion intact, incision: dressing C/D/I, no cellulitis present and compartment soft.   LABS  Basename  10/19/12     0415  HGB  9.4  HCT  29.0    Discharge Exam: General appearance: alert, cooperative and no distress Extremities: Homans sign is negative, no sign of DVT, no edema, redness or tenderness in the calves or thighs and no ulcers, gangrene or trophic changes  Disposition:    Home or Self Care with follow up in 2 weeks   Follow-up Information   Follow up with Shelda Pal, MD. Schedule an appointment as soon as possible for a visit in 2 weeks.   Contact information:   [address] [place] [address] [phone]       Discharge Orders   Future Appointments Provider Department Dept Phone   01/24/2013 8:00 AM Wanda Plump, MD [facility]  [place] [phone]   Future Orders Complete By Expires     Call  MD / Call 911  As directed     Comments:      If you experience chest pain or shortness of breath, CALL 911 and be transported to the hospital emergency room.  If you develope a fever above 101 F, pus (white drainage) or increased drainage or redness at the wound, or calf pain, call your surgeon's office.    Change dressing  As directed     Comments:      Maintain surgical dressing for 10-14 days, then change the dressing daily with sterile 4 x 4 inch gauze dressing and tape.  Keep the area dry and clean.    Constipation Prevention  As directed     Comments:      Drink plenty of fluids.  Prune juice may be helpful.  You may use a stool softener, such as Colace (over the counter) 100 mg twice a day.  Use MiraLax (over the counter) for constipation as needed.    Diet - low sodium heart healthy  As directed     Discharge instructions  As directed     Comments:      Maintain surgical dressing for 10-14 days, then replace with gauze and tape. Keep the area dry and clean until follow up. Follow up in 2 weeks at [facility]. Call with any questions or concerns.    Driving restrictions  As directed     Comments:      No driving for 4 weeks    Increase activity slowly as tolerated  As directed     TED hose  As directed     Comments:      Use stockings (TED hose) for 2 weeks on both leg(s).  You may remove them at night for sleeping.    Weight bearing as tolerated  As directed          Medication List    TAKE these medications       aspirin 325 MG EC tablet  Take 1 tablet (325 mg total) by mouth 2 (two) times daily.     benazepril 40 MG tablet  Commonly known as:  LOTENSIN  Take 40 mg by mouth every morning.     celecoxib 200 MG capsule  Commonly known as:  CELEBREX  Take 200 mg by mouth every morning.     DSS 100 MG Caps  Take 100 mg by mouth 2 (two) times daily.     ferrous sulfate 325 (65 FE) MG tablet  Take 1 tablet (325 mg total) by mouth 3 (three) times daily after meals.     HYDROcodone-acetaminophen 7.5-325 MG per tablet  Commonly known as:  NORCO  Take 1-2 tablets by mouth every 4 (four) hours as needed for pain.     omeprazole 20 MG tablet  Commonly known as:  PRILOSEC OTC  Take 20 mg by mouth every morning.     polyethylene glycol packet  Commonly known as:  MIRALAX / GLYCOLAX  Take 17 g by mouth 2 (two) times daily.     tiZANidine 4 MG capsule  Commonly known as:  ZANAFLEX  Take 1 capsule (4 mg total) by mouth 3 (three)  times daily as needed for muscle spasms. Muscle spasms         Signed: Anastasio Auerbach. Logan Baltimore   PAC  10/19/2012, 3:09 PM

## 2012-10-19 NOTE — Progress Notes (Signed)
Physical Therapy Treatment Patient Details Name: William Norton MRN: [id number] DOB: 10-Jun-1945 Today's Date: 10/19/2012 Time: 5621-3086 PT Time Calculation (min): 15 min  PT Assessment / Plan / Recommendation  PT Comments   Pt progressing very well with all mobility and is ready for D/C.   Follow Up Recommendations  Home health PT     Does the patient have the potential to tolerate intense rehabilitation     Barriers to Discharge        Equipment Recommendations  None recommended by PT    Recommendations for Other Services    Frequency 7X/week   Progress towards PT Goals Progress towards PT goals: Progressing toward goals  Plan Current plan remains appropriate    Precautions / Restrictions Precautions Precautions: Knee Restrictions Weight Bearing Restrictions: No Other Position/Activity Restrictions: WBAT   Pertinent Vitals/Pain 5/10    Mobility  Bed Mobility Bed Mobility: Supine to Sit Supine to Sit: 6: Modified independent (Device/Increase time) Transfers Transfers: Sit to Stand;Stand to Sit Sit to Stand: With upper extremity assist;From bed;6: Modified independent (Device/Increase time) Stand to Sit: With armrests;To chair/3-in-1;6: Modified independent (Device/Increase time) Details for Transfer Assistance: Pt able to demonstrate good UE use Ambulation/Gait Ambulation/Gait Assistance: 5: Supervision Ambulation Distance (Feet): 300 Feet Assistive device: Rolling walker Ambulation/Gait Assistance Details: very min cues for increasing UE WB to decrease antalgic gait pattern.  Gait Pattern: Step-through pattern Gait velocity: decreased Stairs: Yes Stairs Assistance: 4: Min guard Stair Management Technique: No rails;Step to pattern;Forwards;With walker Number of Stairs: 1    Exercises Total Joint Exercises Ankle Circles/Pumps: AROM;Both;20 reps Quad Sets: AROM;Right;10 reps Hip ABduction/ADduction: AROM;Right;10 reps Straight Leg Raises: AROM;Right;10  reps Knee Flexion: AROM;Right;10 reps Goniometric ROM: Pt with 90 deg AROM knee flex seated in chair   PT Diagnosis:    PT Problem List:   PT Treatment Interventions:     PT Goals (current goals can now be found in the care plan section)    Visit Information  Last PT Received On: 10/19/12 Assistance Needed: +1    Subjective Data      Cognition  Cognition Arousal/Alertness: Awake/alert Behavior During Therapy: WFL for tasks assessed/performed Overall Cognitive Status: Within Functional Limits for tasks assessed    Balance     End of Session PT - End of Session Activity Tolerance: Patient tolerated treatment well Patient left: in chair;with call bell/phone within reach Nurse Communication: Mobility status   GP     Vista Deck 10/19/2012, 2:31 PM

## 2012-10-19 NOTE — Progress Notes (Signed)
OT Cancellation Note  Patient Details Name: MARISOL GLAZER MRN: [id number] DOB: 02/24/1946   Cancelled Treatment:    Reason Eval/Treat Not Completed: OT screened, no needs identified, will sign off  Nasya Vincent 10/19/2012, 12:09 PM Marica Otter, OTR/L [phone] 10/19/2012

## 2012-10-19 NOTE — Progress Notes (Signed)
Physical Therapy Treatment Patient Details Name: William Norton MRN: [id number] DOB: 06-Jan-1946 Today's Date: 10/19/2012 Time: 4098-1191 PT Time Calculation (min): 25 min  PT Assessment / Plan / Recommendation  PT Comments   Pt progressing well with mobility. Some minor safety concerns with use of RW at times.  Will continue to address in pm session for D/C later today.   Follow Up Recommendations  Home health PT     Does the patient have the potential to tolerate intense rehabilitation     Barriers to Discharge        Equipment Recommendations  None recommended by PT    Recommendations for Other Services    Frequency 7X/week   Progress towards PT Goals Progress towards PT goals: Progressing toward goals  Plan Current plan remains appropriate    Precautions / Restrictions Precautions Precautions: Knee Restrictions Weight Bearing Restrictions: No Other Position/Activity Restrictions: WBAT   Pertinent Vitals/Pain 6/10, ice packs applied    Mobility  Bed Mobility Bed Mobility: Supine to Sit Supine to Sit: 6: Modified independent (Device/Increase time) Transfers Transfers: Sit to Stand;Stand to Sit Sit to Stand: 5: Supervision;With upper extremity assist;From bed Stand to Sit: 5: Supervision;With armrests;To chair/3-in-1 Details for Transfer Assistance: Supervision for safety with min cues for hand placement.  Ambulation/Gait Ambulation/Gait Assistance: 5: Supervision;4: Min guard Ambulation Distance (Feet): 250 Feet Assistive device: Rolling walker Ambulation/Gait Assistance Details: Intermittent min/guard as pt would let RW get too far ahead of him, esp when turning.  Cues to correct sequencing/technique.  Gait Pattern: Step-through pattern Gait velocity: decreased    Exercises Total Joint Exercises Ankle Circles/Pumps: AROM;Both;20 reps Quad Sets: AROM;Right;10 reps Hip ABduction/ADduction: AROM;Right;10 reps Straight Leg Raises: AROM;Right;10 reps Knee Flexion:  AROM;Right;10 reps Goniometric ROM: Pt with 90 deg AROM knee flex seated in chair   PT Diagnosis:    PT Problem List:   PT Treatment Interventions:     PT Goals    Visit Information  Last PT Received On: 10/19/12 Assistance Needed: +1    Subjective Data      Cognition  Cognition Arousal/Alertness: Awake/alert Behavior During Therapy: WFL for tasks assessed/performed Overall Cognitive Status: Within Functional Limits for tasks assessed    Balance     End of Session PT - End of Session Activity Tolerance: Patient tolerated treatment well Patient left: in chair;with call bell/phone within reach Nurse Communication: Mobility status   GP     Vista Deck 10/19/2012, 11:50 AM

## 2012-11-08 ENCOUNTER — Encounter: Payer: Medicare Other | Admitting: Internal Medicine

## 2012-12-21 ENCOUNTER — Other Ambulatory Visit: Payer: Self-pay | Admitting: *Deleted

## 2012-12-21 MED ORDER — BENAZEPRIL HCL 40 MG PO TABS: 40.0000 mg | ORAL_TABLET | Freq: Every morning | ORAL | Status: DC

## 2012-12-21 NOTE — Telephone Encounter (Signed)
Rx was refilled for Lotensin 40 mg.  Ag cma

## 2013-01-09 ENCOUNTER — Other Ambulatory Visit: Payer: Self-pay | Admitting: Internal Medicine

## 2013-01-09 NOTE — Telephone Encounter (Signed)
rx refilled per protocol. DJR  

## 2013-01-24 ENCOUNTER — Ambulatory Visit: Payer: Medicare Other | Admitting: Internal Medicine

## 2013-01-24 DIAGNOSIS — Z0289 Encounter for other administrative examinations: Secondary | ICD-10-CM

## 2013-02-26 ENCOUNTER — Other Ambulatory Visit: Payer: Self-pay | Admitting: Internal Medicine

## 2013-02-27 NOTE — Telephone Encounter (Signed)
rx refilled per protocol. DJR  

## 2013-03-18 ENCOUNTER — Other Ambulatory Visit: Payer: Self-pay | Admitting: Internal Medicine

## 2013-03-28 ENCOUNTER — Ambulatory Visit: Payer: Medicare Other | Admitting: Internal Medicine

## 2013-06-05 ENCOUNTER — Other Ambulatory Visit: Payer: Self-pay | Admitting: Internal Medicine

## 2013-07-12 ENCOUNTER — Emergency Department
Admission: EM | Admit: 2013-07-12 | Discharge: 2013-07-12 | Disposition: A | Discharge status: Home / Self Care | Payer: Medicare HMO | Source: Home / Self Care | Attending: Family Medicine | Admitting: Family Medicine

## 2013-07-12 ENCOUNTER — Encounter: Payer: Self-pay | Admitting: Emergency Medicine

## 2013-07-12 DIAGNOSIS — J069 Acute upper respiratory infection, unspecified: Secondary | ICD-10-CM

## 2013-07-12 LAB — POCT RAPID STREP A (OFFICE): Rapid Strep A Screen: NEGATIVE

## 2013-07-12 MED ORDER — BENZONATATE 200 MG PO CAPS: 200.0000 mg | ORAL_CAPSULE | Freq: Every day | ORAL | Status: DC

## 2013-07-12 MED ORDER — DOXYCYCLINE HYCLATE 100 MG PO CAPS: 100.0000 mg | ORAL_CAPSULE | Freq: Two times a day (BID) | ORAL | Status: DC

## 2013-07-12 NOTE — Discharge Instructions (Signed)
Take plain Mucinex (1200 mg guaifenesin) twice daily for cough and congestion. Increase fluid intake, rest. May use Afrin nasal spray (or generic oxymetazoline) twice daily for about 5 days.  Also recommend using saline nasal spray several times daily and saline nasal irrigation (AYR is a common brand) Try warm salt water gargles for sore throat.  Stop all antihistamines for now, and other non-prescription cough/cold preparations. May take Tylenol for fever, body aches, etc. Begin Doxycycline if not improving about one week or if persistent fever develops  Follow-up with family doctor if not improving10 days.
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
## 2013-07-12 NOTE — ED Notes (Signed)
Pt c/o sore throat, nasal congestion, nonproductive cough, and runny nose x 1 day. Denies fever.

## 2013-10-23 ENCOUNTER — Ambulatory Visit (INDEPENDENT_AMBULATORY_CARE_PROVIDER_SITE_OTHER): Payer: Medicare HMO | Admitting: Internal Medicine

## 2013-10-23 ENCOUNTER — Encounter: Payer: Self-pay | Admitting: Internal Medicine

## 2013-10-23 VITALS — BP 143/76 | HR 60 | Temp 98.0°F | Wt 199.0 lb

## 2013-10-23 DIAGNOSIS — I10 Essential (primary) hypertension: Secondary | ICD-10-CM

## 2013-10-23 DIAGNOSIS — E119 Type 2 diabetes mellitus without complications: Secondary | ICD-10-CM

## 2013-10-23 LAB — CBC WITH DIFFERENTIAL/PLATELET
BASOS PCT: 0.5 % (ref 0.0–3.0)
Basophils Absolute: 0 10*3/uL (ref 0.0–0.1)
EOS PCT: 7.1 % — AB (ref 0.0–5.0)
Eosinophils Absolute: 0.5 10*3/uL (ref 0.0–0.7)
HCT: 37.8 % — ABNORMAL LOW (ref 39.0–52.0)
Hemoglobin: 12.2 g/dL — ABNORMAL LOW (ref 13.0–17.0)
LYMPHS PCT: 26.5 % (ref 12.0–46.0)
Lymphs Abs: 1.8 10*3/uL (ref 0.7–4.0)
MCHC: 32.4 g/dL (ref 30.0–36.0)
MCV: 78 fl (ref 78.0–100.0)
MONO ABS: 0.7 10*3/uL (ref 0.1–1.0)
MONOS PCT: 10 % (ref 3.0–12.0)
NEUTROS PCT: 55.9 % (ref 43.0–77.0)
Neutro Abs: 3.8 10*3/uL (ref 1.4–7.7)
Platelets: 451 10*3/uL — ABNORMAL HIGH (ref 150.0–400.0)
RBC: 4.84 Mil/uL (ref 4.22–5.81)
RDW: 17.8 % — ABNORMAL HIGH (ref 11.5–15.5)
WBC: 6.8 10*3/uL (ref 4.0–10.5)

## 2013-10-23 LAB — HEMOGLOBIN A1C: HEMOGLOBIN A1C: 6 % (ref 4.6–6.5)

## 2013-10-23 MED ORDER — BENAZEPRIL HCL 40 MG PO TABS: ORAL_TABLET | ORAL | Status: DC

## 2013-10-23 NOTE — Patient Instructions (Signed)
Get your blood work before you leave  Keep your appointment for September

## 2013-10-23 NOTE — Assessment & Plan Note (Signed)
Will check A1c

## 2013-10-23 NOTE — Assessment & Plan Note (Addendum)
BMP At [facility] was ok. He run out of ACE inhibitor A week ago, refill provided

## 2013-10-23 NOTE — Progress Notes (Signed)
Pre visit review using our clinic review tool, if applicable. No additional management support is needed unless otherwise documented below in the visit note. 
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
## 2013-10-24 ENCOUNTER — Telehealth: Payer: Self-pay | Admitting: Internal Medicine

## 2013-10-24 NOTE — Telephone Encounter (Signed)
Relevant patient education assigned to patient using Emmi. ° °

## 2013-10-25 ENCOUNTER — Encounter: Payer: Self-pay | Admitting: *Deleted

## 2013-11-22 ENCOUNTER — Encounter: Payer: Medicare HMO | Admitting: Internal Medicine

## 2013-12-28 ENCOUNTER — Encounter: Payer: Medicare HMO | Admitting: Internal Medicine

## 2014-01-17 DIAGNOSIS — S14125A Central cord syndrome at C5 level of cervical spinal cord, initial encounter: Secondary | ICD-10-CM | POA: Insufficient documentation

## 2014-01-17 DIAGNOSIS — S14129A Central cord syndrome at unspecified level of cervical spinal cord, initial encounter: Secondary | ICD-10-CM | POA: Insufficient documentation

## 2014-01-22 ENCOUNTER — Other Ambulatory Visit: Payer: Self-pay | Admitting: Internal Medicine

## 2014-01-30 ENCOUNTER — Ambulatory Visit (INDEPENDENT_AMBULATORY_CARE_PROVIDER_SITE_OTHER): Payer: Medicare HMO | Admitting: Internal Medicine

## 2014-01-30 ENCOUNTER — Encounter: Payer: Self-pay | Admitting: Internal Medicine

## 2014-01-30 VITALS — BP 118/74 | HR 117 | Temp 97.9°F | Wt 220.5 lb

## 2014-01-30 DIAGNOSIS — Q6102 Congenital multiple renal cysts: Secondary | ICD-10-CM

## 2014-01-30 DIAGNOSIS — I10 Essential (primary) hypertension: Secondary | ICD-10-CM

## 2014-01-30 DIAGNOSIS — E119 Type 2 diabetes mellitus without complications: Secondary | ICD-10-CM

## 2014-01-30 DIAGNOSIS — D500 Iron deficiency anemia secondary to blood loss (chronic): Secondary | ICD-10-CM

## 2014-01-30 DIAGNOSIS — N281 Cyst of kidney, acquired: Secondary | ICD-10-CM | POA: Insufficient documentation

## 2014-01-30 DIAGNOSIS — Z0000 Encounter for general adult medical examination without abnormal findings: Secondary | ICD-10-CM

## 2014-01-30 DIAGNOSIS — Z23 Encounter for immunization: Secondary | ICD-10-CM

## 2014-01-30 LAB — BASIC METABOLIC PANEL
BUN: 24 mg/dL — AB (ref 6–23)
CHLORIDE: 105 meq/L (ref 96–112)
CO2: 23 meq/L (ref 19–32)
Calcium: 9.5 mg/dL (ref 8.4–10.5)
Creatinine, Ser: 1 mg/dL (ref 0.4–1.5)
GFR: 78.64 mL/min (ref >60.00)
GLUCOSE: 104 mg/dL — AB (ref 70–99)
POTASSIUM: 4.3 meq/L (ref 3.5–5.1)
Sodium: 136 mEq/L (ref 135–145)

## 2014-01-30 LAB — CBC WITH DIFFERENTIAL/PLATELET
BASOS ABS: 0 10*3/uL (ref 0.0–0.1)
Basophils Relative: 0.6 % (ref 0.0–3.0)
Eosinophils Absolute: 0.3 10*3/uL (ref 0.0–0.7)
Eosinophils Relative: 5 % (ref 0.0–5.0)
HEMATOCRIT: 39 % (ref 39.0–52.0)
HEMOGLOBIN: 12.5 g/dL — AB (ref 13.0–17.0)
LYMPHS ABS: 2.1 10*3/uL (ref 0.7–4.0)
Lymphocytes Relative: 33.3 % (ref 12.0–46.0)
MCHC: 32 g/dL (ref 30.0–36.0)
MCV: 76.3 fl — AB (ref 78.0–100.0)
MONO ABS: 0.6 10*3/uL (ref 0.1–1.0)
Monocytes Relative: 9.2 % (ref 3.0–12.0)
NEUTROS ABS: 3.3 10*3/uL (ref 1.4–7.7)
Neutrophils Relative %: 51.9 % (ref 43.0–77.0)
PLATELETS: 321 10*3/uL (ref 150.0–400.0)
RBC: 5.11 Mil/uL (ref 4.22–5.81)
RDW: 17.5 % — AB (ref 11.5–15.5)
WBC: 6.3 10*3/uL (ref 4.0–10.5)

## 2014-01-30 MED ORDER — BENAZEPRIL HCL 40 MG PO TABS: ORAL_TABLET | ORAL | Status: DC

## 2014-01-30 MED ORDER — CELECOXIB 200 MG PO CAPS: ORAL_CAPSULE | ORAL | Status: DC
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
## 2014-01-30 NOTE — Patient Instructions (Signed)
Get your blood work before you leave    Please come back to the office in 4 months for a physical exam. Come back fasting      REMINDERS It is extremely important to know what medications you take, please bring all bottles with you (or an accurate medication list) for every visit  If we are ordering labs, XRs or referring you to a specialist : we will always communicate to you the results or the time of the appointment within few days. If you don't hear from [place] please call the office

## 2014-01-30 NOTE — Assessment & Plan Note (Signed)
BP well-controlled, last sodium slightly low, check a BMP, refill medications.

## 2014-01-30 NOTE — Progress Notes (Signed)
Pre visit review using our clinic review tool, if applicable. No additional management support is needed unless otherwise documented below in the visit note. 
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
## 2014-01-30 NOTE — Assessment & Plan Note (Signed)
Last time CBC was checked, hemoglobin was low, rechecked today

## 2014-01-30 NOTE — Assessment & Plan Note (Signed)
Prevnar today High dose versus regular dose a flu shot discussed, elected regular dose

## 2014-01-30 NOTE — Assessment & Plan Note (Signed)
Last A1c satisfactory, check the A1c on return to the office

## 2014-02-01 ENCOUNTER — Telehealth: Payer: Self-pay | Admitting: Internal Medicine

## 2014-02-01 NOTE — Telephone Encounter (Signed)
See lab result notes

## 2014-06-04 ENCOUNTER — Ambulatory Visit (INDEPENDENT_AMBULATORY_CARE_PROVIDER_SITE_OTHER): Payer: Medicare HMO | Admitting: Internal Medicine

## 2014-06-04 ENCOUNTER — Encounter: Payer: Self-pay | Admitting: Internal Medicine

## 2014-06-04 VITALS — BP 150/88 | HR 70 | Temp 97.7°F | Ht 71.0 in | Wt 223.2 lb

## 2014-06-04 DIAGNOSIS — I10 Essential (primary) hypertension: Secondary | ICD-10-CM

## 2014-06-04 DIAGNOSIS — N402 Nodular prostate without lower urinary tract symptoms: Secondary | ICD-10-CM

## 2014-06-04 DIAGNOSIS — D509 Iron deficiency anemia, unspecified: Secondary | ICD-10-CM

## 2014-06-04 DIAGNOSIS — D500 Iron deficiency anemia secondary to blood loss (chronic): Secondary | ICD-10-CM

## 2014-06-04 DIAGNOSIS — M158 Other polyosteoarthritis: Secondary | ICD-10-CM

## 2014-06-04 DIAGNOSIS — K2270 Barrett's esophagus without dysplasia: Secondary | ICD-10-CM

## 2014-06-04 DIAGNOSIS — Z0000 Encounter for general adult medical examination without abnormal findings: Secondary | ICD-10-CM

## 2014-06-04 DIAGNOSIS — E119 Type 2 diabetes mellitus without complications: Secondary | ICD-10-CM

## 2014-06-04 LAB — FERRITIN: Ferritin: 21.1 ng/mL — ABNORMAL LOW (ref 22.0–322.0)

## 2014-06-04 LAB — COMPREHENSIVE METABOLIC PANEL
ALK PHOS: 62 U/L (ref 39–117)
ALT: 43 U/L (ref 0–53)
AST: 25 U/L (ref 0–37)
Albumin: 4.2 g/dL (ref 3.5–5.2)
BILIRUBIN TOTAL: 0.5 mg/dL (ref 0.2–1.2)
BUN: 25 mg/dL — AB (ref 6–23)
CHLORIDE: 106 meq/L (ref 96–112)
CO2: 22 meq/L (ref 19–32)
Calcium: 9.5 mg/dL (ref 8.4–10.5)
Creatinine, Ser: 0.91 mg/dL (ref 0.40–1.50)
GFR: 87.59 mL/min (ref >60.00)
Glucose, Bld: 101 mg/dL — ABNORMAL HIGH (ref 70–99)
Potassium: 4.8 mEq/L (ref 3.5–5.1)
SODIUM: 138 meq/L (ref 135–145)
Total Protein: 7.2 g/dL (ref 6.0–8.3)

## 2014-06-04 LAB — LIPID PANEL
Cholesterol: 170 mg/dL (ref 0–200)
HDL: 57.6 mg/dL (ref >39.00)
LDL Cholesterol: 93 mg/dL (ref 0–99)
NonHDL: 112.4
Total CHOL/HDL Ratio: 3
Triglycerides: 99 mg/dL (ref 0.0–149.0)
VLDL: 19.8 mg/dL (ref 0.0–40.0)

## 2014-06-04 LAB — PSA: PSA: 4.67 ng/mL — AB (ref 0.10–4.00)

## 2014-06-04 LAB — IRON: Iron: 23 ug/dL — ABNORMAL LOW (ref 42–165)

## 2014-06-04 LAB — HEMOGLOBIN A1C: Hgb A1c MFr Bld: 6.7 % — ABNORMAL HIGH (ref 4.6–6.5)

## 2014-06-04 LAB — HEMOGLOBIN: Hemoglobin: 12.6 g/dL — ABNORMAL LOW (ref 13.0–17.0)

## 2014-06-04 MED ORDER — BENAZEPRIL HCL 40 MG PO TABS: ORAL_TABLET | ORAL | Status: DC

## 2014-06-04 MED ORDER — CELECOXIB 200 MG PO CAPS: ORAL_CAPSULE | ORAL | Status: DC
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
## 2014-06-04 NOTE — Assessment & Plan Note (Signed)
Seems well-controlled, continue with Lotensin, check a CMP

## 2014-06-04 NOTE — Progress Notes (Signed)
Pre visit review using our clinic review tool, if applicable. No additional management support is needed unless otherwise documented below in the visit note. 

## 2014-06-04 NOTE — Assessment & Plan Note (Signed)
Recheck CBC and iron stores today

## 2014-06-04 NOTE — Assessment & Plan Note (Addendum)
Td 2008  pneumonia shot 06-09-10 prevnar 10-15 Shingles immunization discussed before  never had a Cscope , he has anemia, we are rechecking hemoglobin today. We discuss the benefits of a colonoscopy-cologuard and ifob -- elected IFOB diet-exercise discussed labs
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
## 2014-06-04 NOTE — Patient Instructions (Signed)
Get your blood work before you leave   Check the  blood pressure 2 or 3 times a month   Be sure your blood pressure is between 110/65 and  145/85.  if it is consistently higher or lower, let me know    Please come back to the office in 6 months  for a routine check up        Fall Prevention and [place] cause injuries and can affect all age groups. It is possible to use preventive measures to significantly decrease the likelihood of falls. There are many simple measures which can make your home safer and prevent falls. OUTDOORS  Repair cracks and edges of walkways and driveways.  Remove high doorway thresholds.  Trim shrubbery on the main path into your home.  Have good outside lighting.  Clear walkways of tools, rocks, debris, and clutter.  Check that handrails are not broken and are securely fastened. Both sides of steps should have handrails.  Have leaves, snow, and ice cleared regularly.  Use sand or salt on walkways during winter months.  In the garage, clean up grease or oil spills. BATHROOM  Install night lights.  Install grab bars by the toilet and in the tub and shower.  Use non-skid mats or decals in the tub or shower.  Place a plastic non-slip stool in the shower to sit on, if needed.  Keep floors dry and clean up all water on the floor immediately.  Remove soap buildup in the tub or shower on a regular basis.  Secure bath mats with non-slip, double-sided rug tape.  Remove throw rugs and tripping hazards from the floors. BEDROOMS  Install night lights.  Make sure a bedside light is easy to reach.  Do not use oversized bedding.  Keep a telephone by your bedside.  Have a firm chair with side arms to use for getting dressed.  Remove throw rugs and tripping hazards from the floor. KITCHEN  Keep handles on pots and pans turned toward the center of the stove. Use back burners when possible.  Clean up spills quickly and allow time for  drying.  Avoid walking on wet floors.  Avoid hot utensils and knives.  Position shelves so they are not too high or low.  Place commonly used objects within easy reach.  If necessary, use a sturdy step stool with a grab bar when reaching.  Keep electrical cables out of the way.  Do not use floor polish or wax that makes floors slippery. If you must use wax, use non-skid floor wax.  Remove throw rugs and tripping hazards from the floor. STAIRWAYS  Never leave objects on stairs.  Place handrails on both sides of stairways and use them. Fix any loose handrails. Make sure handrails on both sides of the stairways are as long as the stairs.  Check carpeting to make sure it is firmly attached along stairs. Make repairs to worn or loose carpet promptly.  Avoid placing throw rugs at the top or bottom of stairways, or properly secure the rug with carpet tape to prevent slippage. Get rid of throw rugs, if possible.  Have an electrician put in a light switch at the top and bottom of the stairs. OTHER FALL PREVENTION TIPS  Wear low-heel or rubber-soled shoes that are supportive and fit well. Wear closed toe shoes.  When using a stepladder, make sure it is fully opened and both spreaders are firmly locked. Do not climb a closed stepladder.  Add color  or contrast paint or tape to grab bars and handrails in your home. Place contrasting color strips on first and last steps.  Learn and use mobility aids as needed. Install an electrical emergency response system.  Turn on lights to avoid dark areas. Replace light bulbs that burn out immediately. Get light switches that glow.  Arrange furniture to create clear pathways. Keep furniture in the same place.  Firmly attach carpet with non-skid or double-sided tape.  Eliminate uneven floor surfaces.  Select a carpet pattern that does not visually hide the edge of steps.  Be aware of all pets. OTHER HOME SAFETY TIPS  Set the water temperature  for 120 F (48.8 C).  Keep emergency numbers on or near the telephone.  Keep smoke detectors on every level of the home and near sleeping areas. Document Released: 04/03/2002 Document Revised: 10/13/2011 Document Reviewed: 07/03/2011 [facility] Patient Information 2015 [place], [place]. This information is not intended to replace advice given to you by your health care provider. Make sure you discuss any questions you have with your health care provider.    Preventive Care for Adults   Ages 105 and over  Blood pressure check.** / Every 1 to 2 years.  Lipid and cholesterol check.**/ Every 5 years beginning at age 29.  Lung cancer screening. / Every year if you are aged 58-80 years and have a 30-pack-year history of smoking and currently smoke or have quit within the past 15 years. Yearly screening is stopped once you have quit smoking for at least 15 years or develop a health problem that would prevent you from having lung cancer treatment.  Fecal occult blood test (FOBT) of stool. / Every year beginning at age 39 and continuing until age 45. You may not have to do this test if you get a colonoscopy every 10 years.  Flexible sigmoidoscopy** or colonoscopy.** / Every 5 years for a flexible sigmoidoscopy or every 10 years for a colonoscopy beginning at age 55 and continuing until age 65.  Hepatitis C blood test.** / For all people born from [birth year] through 1965 and any individual with known risks for hepatitis C.  Abdominal aortic aneurysm (AAA) screening.** / A one-time screening for ages 70 to 70 years who are current or former smokers.  Skin self-exam. / Monthly.  Influenza vaccine. / Every year.  Tetanus, diphtheria, and acellular pertussis (Tdap/Td) vaccine.** / 1 dose of Td every 10 years.  Varicella vaccine.** / Consult your health care provider.  Zoster vaccine.** / 1 dose for adults aged 23 years or older.  Pneumococcal 13-valent conjugate (PCV13) vaccine.** / Consult your  health care provider.  Pneumococcal polysaccharide (PPSV23) vaccine.** / 1 dose for all adults aged 7 years and older.  Meningococcal vaccine.** / Consult your health care provider.  Hepatitis A vaccine.** / Consult your health care provider.  Hepatitis B vaccine.** / Consult your health care provider.  Haemophilus influenzae type b (Hib) vaccine.** / Consult your health care provider. **Family history and personal history of risk and conditions may change your health care provider's recommendations. Document Released: 06/09/2001 Document Revised: 04/18/2013 Document Reviewed: 09/08/2010 [facility] Patient Information 2015 [place], [place]. This information is not intended to replace advice given to you by your health care provider. Make sure you discuss any questions you have with your health care provider.

## 2014-06-04 NOTE — Assessment & Plan Note (Signed)
Last visit with urology 2012, not reviewed, he was felt to be stable and recommended follow-up in one year. DRE today no nodule, check a PSA

## 2014-06-04 NOTE — Assessment & Plan Note (Signed)
Had a EGD/2000 1514, next in 2017. Still has acid reflux symptoms, recommend to change nexium  timing from noon to  before breakfast. Precautions discussed, recheck  6 months

## 2014-06-04 NOTE — Assessment & Plan Note (Signed)
Check A1c, continue Lotensin

## 2014-06-06 DIAGNOSIS — M48061 Spinal stenosis, lumbar region without neurogenic claudication: Secondary | ICD-10-CM | POA: Insufficient documentation

## 2014-06-12 NOTE — Addendum Note (Signed)
Addended by: Wilfrid Lund on: 06/12/2014 09:06 AM   Modules accepted: Orders

## 2014-06-19 ENCOUNTER — Telehealth: Payer: Self-pay | Admitting: Internal Medicine

## 2014-06-19 MED ORDER — FERROUS SULFATE 325 (65 FE) MG PO TABS
325.0000 mg | ORAL_TABLET | Freq: Two times a day (BID) | ORAL | Status: DC
Start: 2014-06-19 — End: 2014-12-03

## 2014-06-19 NOTE — Telephone Encounter (Signed)
Ferrous Sulfate 325 mg 1 tablet by mouth twice daily (#60 and 2 RF) sent to [facility]. Informed Pt that medication has been sent and to begin a multivitamin daily. Pt verbalized understanding.

## 2014-06-19 NOTE — Telephone Encounter (Signed)
The degree of anemia he has is not likely to explain the fatigue by itself. Plan: Call FeSO4 325 mg one tablet twice a day #60 , 2 RF Start a OTC multivitamin daily If he is not improving call in one month

## 2014-06-19 NOTE — Telephone Encounter (Signed)
Pt requesting something for fatigue (iron supplements), Pt has appt with GI Carlean Purl) on 08/08/2014 at 1100 regarding anemia.   Please advise.

## 2014-06-19 NOTE — Telephone Encounter (Signed)
.  Caller name: Belinda, [place] Relation to pt: self  Call back number: [phone] Pharmacy: [id number] - [place], [address] AT [address] [phone]) [phone]         Reason for call:  Pt exp. low energy and no stamina pt feels like he needs iron medication. Please advise

## 2014-08-08 ENCOUNTER — Encounter: Payer: Self-pay | Admitting: Internal Medicine

## 2014-08-08 ENCOUNTER — Ambulatory Visit (INDEPENDENT_AMBULATORY_CARE_PROVIDER_SITE_OTHER): Payer: Medicare HMO | Admitting: Internal Medicine

## 2014-08-08 VITALS — BP 116/70 | HR 51 | Ht 71.5 in | Wt 227.6 lb

## 2014-08-08 DIAGNOSIS — D509 Iron deficiency anemia, unspecified: Secondary | ICD-10-CM | POA: Diagnosis not present

## 2014-08-08 DIAGNOSIS — K2270 Barrett's esophagus without dysplasia: Secondary | ICD-10-CM | POA: Diagnosis not present

## 2014-08-08 NOTE — Patient Instructions (Signed)
You have been scheduled for an endoscopy and colonoscopy. Please follow the written instructions given to you at your visit today. Please pick up your prep supplies at the pharmacy within the next 1-3 days. If you use inhalers (even only as needed), please bring them with you on the day of your procedure.  Your physician has requested that you go to the basement for the following lab work before leaving today: CBC/diff  I appreciate the opportunity to care for you.
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
## 2014-08-09 ENCOUNTER — Other Ambulatory Visit (INDEPENDENT_AMBULATORY_CARE_PROVIDER_SITE_OTHER): Payer: Medicare HMO

## 2014-08-09 DIAGNOSIS — D509 Iron deficiency anemia, unspecified: Secondary | ICD-10-CM | POA: Diagnosis not present

## 2014-08-09 LAB — CBC WITH DIFFERENTIAL/PLATELET
BASOS ABS: 0 10*3/uL (ref 0.0–0.1)
Basophils Relative: 0.5 % (ref 0.0–3.0)
Eosinophils Absolute: 0.3 10*3/uL (ref 0.0–0.7)
Eosinophils Relative: 3.8 % (ref 0.0–5.0)
HCT: 43.9 % (ref 39.0–52.0)
Hemoglobin: 14.9 g/dL (ref 13.0–17.0)
LYMPHS ABS: 2.1 10*3/uL (ref 0.7–4.0)
Lymphocytes Relative: 30 % (ref 12.0–46.0)
MCHC: 33.8 g/dL (ref 30.0–36.0)
MCV: 78.6 fl (ref 78.0–100.0)
MONO ABS: 0.7 10*3/uL (ref 0.1–1.0)
MONOS PCT: 9.9 % (ref 3.0–12.0)
NEUTROS ABS: 3.9 10*3/uL (ref 1.4–7.7)
Neutrophils Relative %: 55.8 % (ref 43.0–77.0)
PLATELETS: 249 10*3/uL (ref 150.0–400.0)
RBC: 5.58 Mil/uL (ref 4.22–5.81)
RDW: 23.5 % — AB (ref 11.5–15.5)
WBC: 7.1 10*3/uL (ref 4.0–10.5)

## 2014-08-10 DIAGNOSIS — D509 Iron deficiency anemia, unspecified: Secondary | ICD-10-CM | POA: Insufficient documentation

## 2014-08-10 DIAGNOSIS — D500 Iron deficiency anemia secondary to blood loss (chronic): Secondary | ICD-10-CM | POA: Insufficient documentation

## 2014-08-10 HISTORY — DX: Iron deficiency anemia secondary to blood loss (chronic): D50.0

## 2014-08-15 NOTE — Progress Notes (Signed)
Quick Note:  Hgb nl stay on ferrous sulfate - My Chart notice ______

## 2014-09-14 ENCOUNTER — Ambulatory Visit (AMBULATORY_SURGERY_CENTER): Payer: Medicare HMO | Admitting: Internal Medicine

## 2014-09-14 ENCOUNTER — Encounter: Payer: Self-pay | Admitting: Internal Medicine

## 2014-09-14 VITALS — BP 128/57 | HR 59 | Temp 97.5°F | Resp 17 | Ht 71.5 in | Wt 227.0 lb

## 2014-09-14 DIAGNOSIS — D509 Iron deficiency anemia, unspecified: Secondary | ICD-10-CM | POA: Diagnosis present

## 2014-09-14 DIAGNOSIS — D122 Benign neoplasm of ascending colon: Secondary | ICD-10-CM

## 2014-09-14 DIAGNOSIS — K621 Rectal polyp: Secondary | ICD-10-CM

## 2014-09-14 DIAGNOSIS — D128 Benign neoplasm of rectum: Secondary | ICD-10-CM

## 2014-09-14 DIAGNOSIS — K2270 Barrett's esophagus without dysplasia: Secondary | ICD-10-CM | POA: Diagnosis not present

## 2014-09-14 DIAGNOSIS — D129 Benign neoplasm of anus and anal canal: Secondary | ICD-10-CM

## 2014-09-14 MED ORDER — SODIUM CHLORIDE 0.9 % IV SOLN
500.0000 mL | INTRAVENOUS | Status: DC
Start: 2014-09-14 — End: 2014-09-14

## 2014-09-14 NOTE — Op Note (Signed)
[place]  [address] [place], [address]   COLONOSCOPY PROCEDURE REPORT  PATIENT: William Norton, William Norton  MR#: [id number] BIRTHDATE: 1945/11/04 , 69  yrs. old GENDER: male ENDOSCOPIST: Gatha Mayer, MD, [name] PROCEDURE DATE:  09/14/2014 PROCEDURE:   Colonoscopy, diagnostic and Colonoscopy with snare polypectomy First Screening Colonoscopy - Avg.  risk and is 50 yrs.  old or older - No.  Prior Negative Screening - Now for repeat screening. N/A  History of Adenoma - Now for follow-up colonoscopy & has been > or = to 3 yrs.  N/A  Polyps removed today? Yes ASA CLASS:   Class II INDICATIONS:iron deficiency anemia. MEDICATIONS: Propofol 100 mg IV, Monitored anesthesia care, and Residual sedation present  DESCRIPTION OF PROCEDURE:   After the risks benefits and alternatives of the procedure were thoroughly explained, informed consent was obtained.  The digital rectal exam revealed no rectal mass and revealed an enlarged prostate.   The LB [id number] endoscope was introduced through the anus and advanced to the cecum, which was identified by both the appendix and ileocecal valve. No adverse events experienced.   The quality of the prep was excellent.  (MiraLax was used)  The instrument was then slowly withdrawn as the colon was fully examined. Estimated blood loss is zero unless otherwise noted in this procedure report.      COLON FINDINGS: Two sessile polyps ranging from 3 to 32mm in size were found in the ascending colon and rectum.  Polypectomies were performed with a cold snare.  The resection was complete, the polyp tissue was completely retrieved and sent to histology.   The examination was otherwise normal.  Retroflexed views revealed no abnormalities. The time to cecum = 1.5 Withdrawal time = 11.4   The scope was withdrawn and the procedure completed. COMPLICATIONS: There were no immediate complications.  ENDOSCOPIC IMPRESSION: 1.   Two sessile  polyps ranging from 3 to 89mm in size were found in the ascending colon and rectum; polypectomies were performed with a cold snare 2.   The examination was otherwise normal excellent prep - no cause of anemia here - possible GAVE on EGD  RECOMMENDATIONS: Await pathology results  eSigned:  Gatha Mayer, MD, [facility] 09/14/2014 4:19 PM   cc: Kathlene November, MD and The Patient

## 2014-09-14 NOTE — Op Note (Signed)
[place]  [address]. [place], [address]   ENDOSCOPY PROCEDURE REPORT  PATIENT: William, Norton  MR#: [id number] BIRTHDATE: May 19, 1945 , 69  yrs. old GENDER: male ENDOSCOPIST: Gatha Mayer, MD, [facility] REFERRED BY: PROCEDURE DATE:  09/14/2014 PROCEDURE:  EGD w/ biopsy ASA CLASS:     Class II INDICATIONS:  iron deficiency anemia and history of Barrett's esophagus. MEDICATIONS: Propofol 100 mg IV and Monitored anesthesia care TOPICAL ANESTHETIC: none  DESCRIPTION OF PROCEDURE: After the risks benefits and alternatives of the procedure were thoroughly explained, informed consent was obtained.  The LB [id number] endoscope was introduced through the mouth and advanced to the second portion of the duodenum , Without limitations.  The instrument was slowly withdrawn as the mucosa was fully examined.    1) 1 cm columnar tongue and smaller one - distal esophagus 35-36 cm. Biopsies taken 2) 4 cm hiatal hernia no Lysbeth Galas erosions3) mucosal changes in antrum - ? gastritis or gastric antral vascular ectasia - biopsied 4) otherwise normal.  Retroflexed views revealed a hiatal hernia and Retroflexed views revealed .     The scope was then withdrawn from the patient and the procedure completed.  COMPLICATIONS: There were no immediate complications.  ENDOSCOPIC IMPRESSION: 1) 1 cm columnar tongue and smaller one - distal esophagus 35-36 cm. Biopsies taken 2) 4 cm hiatal hernia 3) mucosal changes in antrum - ? gastritis or gastric antral vascular ectasia - biopsied 4) otherwise normal  RECOMMENDATIONS: 1.  Proceed with a Colonoscopy. 2.  Await biopsy results 3.  If he has GAVE it could be source of iron-def anemia and could need APC ablation  eSigned:  Gatha Mayer, MD, [facility] 09/14/2014 4:15 PM    CC: Kathlene November, MD and The Patient

## 2014-09-14 NOTE — Progress Notes (Signed)
Pt  To PACU. Awake and alert, Pleased with MAC. Report to RN

## 2014-09-14 NOTE — Patient Instructions (Signed)
YOU HAD AN ENDOSCOPIC PROCEDURE TODAY AT THE [facility]:   Refer to the procedure report that was given to you for any specific questions about what was found during the examination.  If the procedure report does not answer your questions, please call your gastroenterologist to clarify.  If you requested that your care partner not be given the details of your procedure findings, then the procedure report has been included in a sealed envelope for you to review at your convenience later.  YOU SHOULD EXPECT: Some feelings of bloating in the abdomen. Passage of more gas than usual.  Walking can help get rid of the air that was put into your GI tract during the procedure and reduce the bloating. If you had a lower endoscopy (such as a colonoscopy or flexible sigmoidoscopy) you may notice spotting of blood in your stool or on the toilet paper. If you underwent a bowel prep for your procedure, you may not have a normal bowel movement for a few days.  Please Note:  You might notice some irritation and congestion in your nose or some drainage.  This is from the oxygen used during your procedure.  There is no need for concern and it should clear up in a day or so.  SYMPTOMS TO REPORT IMMEDIATELY:   Following lower endoscopy (colonoscopy or flexible sigmoidoscopy):  Excessive amounts of blood in the stool  Significant tenderness or worsening of abdominal pains  Swelling of the abdomen that is new, acute  Fever of 100F or higher   Following upper endoscopy (EGD)  Vomiting of blood or coffee ground material  New chest pain or pain under the shoulder blades  Painful or persistently difficult swallowing  New shortness of breath  Fever of 100F or higher  Black, tarry-looking stools  For urgent or emergent issues, a gastroenterologist can be reached at any hour by calling [phone].   DIET: Your first meal following the procedure should be a small meal and then it is ok to progress to  your normal diet. Heavy or fried foods are harder to digest and may make you feel nauseous or bloated.  Likewise, meals heavy in dairy and vegetables can increase bloating.  Drink plenty of fluids but you should avoid alcoholic beverages for 24 hours.  ACTIVITY:  You should plan to take it easy for the rest of today and you should NOT DRIVE or use heavy machinery until tomorrow (because of the sedation medicines used during the test).    FOLLOW UP: Our staff will call the number listed on your records the next business day following your procedure to check on you and address any questions or concerns that you may have regarding the information given to you following your procedure. If we do not reach you, we will leave a message.  However, if you are feeling well and you are not experiencing any problems, there is no need to return our call.  We will assume that you have returned to your regular daily activities without incident.  If any biopsies were taken you will be contacted by phone or by letter within the next 1-3 weeks.  Please call us at [phone] if you have not heard about the biopsies in 3 weeks.    SIGNATURES/CONFIDENTIALITY: You and/or your care partner have signed paperwork which will be entered into your electronic medical record.  These signatures attest to the fact that that the information above on your After Visit Summary has been reviewed   and is understood.  Full responsibility of the confidentiality of this discharge information lies with you and/or your care-partner. 

## 2014-09-14 NOTE — Progress Notes (Signed)
Called to room to assist during endoscopic procedure.  Patient ID and intended procedure confirmed with present staff. Received instructions for my participation in the procedure from the performing physician.  

## 2014-09-17 ENCOUNTER — Telehealth: Payer: Self-pay

## 2014-09-17 NOTE — Telephone Encounter (Signed)
No answer, left message

## 2014-09-25 ENCOUNTER — Encounter: Payer: Self-pay | Admitting: Internal Medicine

## 2014-09-25 ENCOUNTER — Other Ambulatory Visit: Payer: Self-pay

## 2014-09-25 DIAGNOSIS — Z86010 Personal history of colonic polyps: Secondary | ICD-10-CM

## 2014-09-25 DIAGNOSIS — D509 Iron deficiency anemia, unspecified: Secondary | ICD-10-CM

## 2014-09-25 DIAGNOSIS — Z860101 Personal history of adenomatous and serrated colon polyps: Secondary | ICD-10-CM

## 2014-09-25 HISTORY — DX: Personal history of colonic polyps: Z86.010

## 2014-09-25 HISTORY — DX: Personal history of adenomatous and serrated colon polyps: Z86.0101

## 2014-09-25 NOTE — Progress Notes (Signed)
Quick Note:  Letter being sent and he is asked to make a f/u appt in that letter  One adenoma - repeat colon 2021 Still has Barrett's - repeat EGD 2021 Gastric biopsies unrevealing  Ask him to do hemoccults re: iron-def anemia (3 guaiac cards) ______

## 2014-10-17 ENCOUNTER — Encounter: Payer: Self-pay | Admitting: Internal Medicine

## 2014-10-17 ENCOUNTER — Ambulatory Visit (INDEPENDENT_AMBULATORY_CARE_PROVIDER_SITE_OTHER): Payer: Medicare HMO | Admitting: Internal Medicine

## 2014-10-17 VITALS — BP 120/78 | HR 66 | Temp 98.0°F | Ht 72.0 in | Wt 225.0 lb

## 2014-10-17 DIAGNOSIS — F329 Major depressive disorder, single episode, unspecified: Secondary | ICD-10-CM

## 2014-10-17 DIAGNOSIS — F418 Other specified anxiety disorders: Secondary | ICD-10-CM

## 2014-10-17 DIAGNOSIS — F321 Major depressive disorder, single episode, moderate: Secondary | ICD-10-CM | POA: Insufficient documentation

## 2014-10-17 DIAGNOSIS — F32A Depression, unspecified: Secondary | ICD-10-CM

## 2014-10-17 DIAGNOSIS — F419 Anxiety disorder, unspecified: Principal | ICD-10-CM

## 2014-10-17 MED ORDER — ESCITALOPRAM OXALATE 10 MG PO TABS: 10.0000 mg | ORAL_TABLET | Freq: Every day | ORAL | Status: DC
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
## 2014-10-17 NOTE — Progress Notes (Signed)
Pre visit review using our clinic review tool, if applicable. No additional management support is needed unless otherwise documented below in the visit note. 
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
## 2014-10-17 NOTE — Patient Instructions (Addendum)
Start taking Lexapro 1 tablet every day  Start a exercise program  See one of our counselors  Call if any side effects  Call if you see some improvement in the next 2 weeks

## 2014-10-22 ENCOUNTER — Other Ambulatory Visit: Payer: Self-pay

## 2014-11-06 ENCOUNTER — Ambulatory Visit: Payer: Medicare HMO | Admitting: Licensed Clinical Social Worker

## 2014-12-03 ENCOUNTER — Ambulatory Visit (INDEPENDENT_AMBULATORY_CARE_PROVIDER_SITE_OTHER): Payer: Medicare HMO | Admitting: Internal Medicine

## 2014-12-03 ENCOUNTER — Encounter: Payer: Self-pay | Admitting: Internal Medicine

## 2014-12-03 VITALS — BP 122/66 | HR 59 | Temp 97.7°F | Ht 72.0 in | Wt 206.1 lb

## 2014-12-03 DIAGNOSIS — R5382 Chronic fatigue, unspecified: Secondary | ICD-10-CM

## 2014-12-03 DIAGNOSIS — E119 Type 2 diabetes mellitus without complications: Secondary | ICD-10-CM | POA: Diagnosis not present

## 2014-12-03 DIAGNOSIS — Z1159 Encounter for screening for other viral diseases: Secondary | ICD-10-CM

## 2014-12-03 DIAGNOSIS — R972 Elevated prostate specific antigen [PSA]: Secondary | ICD-10-CM

## 2014-12-03 DIAGNOSIS — M158 Other polyosteoarthritis: Secondary | ICD-10-CM | POA: Diagnosis not present

## 2014-12-03 DIAGNOSIS — D649 Anemia, unspecified: Secondary | ICD-10-CM

## 2014-12-03 DIAGNOSIS — F329 Major depressive disorder, single episode, unspecified: Secondary | ICD-10-CM

## 2014-12-03 DIAGNOSIS — F32A Depression, unspecified: Secondary | ICD-10-CM

## 2014-12-03 DIAGNOSIS — D509 Iron deficiency anemia, unspecified: Secondary | ICD-10-CM

## 2014-12-03 DIAGNOSIS — F419 Anxiety disorder, unspecified: Secondary | ICD-10-CM

## 2014-12-03 LAB — HEPATITIS C ANTIBODY: HCV Ab: NEGATIVE

## 2014-12-03 LAB — CBC WITH DIFFERENTIAL/PLATELET
BASOS ABS: 0 10*3/uL (ref 0.0–0.1)
Basophils Relative: 0.7 % (ref 0.0–3.0)
EOS ABS: 0.2 10*3/uL (ref 0.0–0.7)
Eosinophils Relative: 4 % (ref 0.0–5.0)
HCT: 46 % (ref 39.0–52.0)
Hemoglobin: 15.4 g/dL (ref 13.0–17.0)
Lymphocytes Relative: 27.2 % (ref 12.0–46.0)
Lymphs Abs: 1.4 10*3/uL (ref 0.7–4.0)
MCHC: 33.6 g/dL (ref 30.0–36.0)
MCV: 89.7 fl (ref 78.0–100.0)
Monocytes Absolute: 0.5 10*3/uL (ref 0.1–1.0)
Monocytes Relative: 9.8 % (ref 3.0–12.0)
NEUTROS PCT: 58.3 % (ref 43.0–77.0)
Neutro Abs: 3 10*3/uL (ref 1.4–7.7)
Platelets: 233 10*3/uL (ref 150.0–400.0)
RBC: 5.13 Mil/uL (ref 4.22–5.81)
RDW: 15.6 % — ABNORMAL HIGH (ref 11.5–15.5)
WBC: 5.2 10*3/uL (ref 4.0–10.5)

## 2014-12-03 LAB — TSH: TSH: 1.05 u[IU]/mL (ref 0.35–4.50)

## 2014-12-03 LAB — PSA: PSA: 3.09 ng/mL (ref 0.10–4.00)

## 2014-12-03 LAB — HEMOGLOBIN A1C: HEMOGLOBIN A1C: 5.7 % (ref 4.6–6.5)

## 2014-12-03 MED ORDER — ESCITALOPRAM OXALATE 10 MG PO TABS: 10.0000 mg | ORAL_TABLET | Freq: Every day | ORAL | Status: DC

## 2014-12-03 MED ORDER — FERROUS SULFATE 325 (65 FE) MG PO TABS: 325.0000 mg | ORAL_TABLET | Freq: Two times a day (BID) | ORAL | Status: DC

## 2014-12-03 NOTE — Patient Instructions (Signed)
Get your blood work before you leave    

## 2014-12-03 NOTE — Assessment & Plan Note (Signed)
Definitely better . Cont lexapro
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
## 2014-12-03 NOTE — Assessment & Plan Note (Signed)
C/o claudication, see ROS, likely from spinal stenosis but back pain has improved. Continue w/ increase exercise

## 2014-12-03 NOTE — Progress Notes (Signed)
Pre visit review using our clinic review tool, if applicable. No additional management support is needed unless otherwise documented below in the visit note. 

## 2014-12-03 NOTE — Assessment & Plan Note (Addendum)
Doing great w/ exercise, loosing weight Check a a1c
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
## 2014-12-25 ENCOUNTER — Other Ambulatory Visit: Payer: Medicare HMO

## 2014-12-25 DIAGNOSIS — D509 Iron deficiency anemia, unspecified: Secondary | ICD-10-CM

## 2014-12-25 LAB — HEMOCCULT SLIDES (X 3 CARDS)
OCCULT 3: NEGATIVE
OCCULT 4: NEGATIVE

## 2014-12-26 NOTE — Progress Notes (Signed)
Quick Note:  No blood in stool at least in samples sent I do not plan on further GI w/u See me prn Stay on current meds and f/u Dr. Larose Kells ______

## 2015-03-08 ENCOUNTER — Ambulatory Visit: Payer: Medicare HMO | Admitting: Physical Medicine & Rehabilitation

## 2015-03-26 ENCOUNTER — Encounter: Payer: Medicare HMO | Attending: Physical Medicine & Rehabilitation

## 2015-03-26 ENCOUNTER — Ambulatory Visit: Payer: Medicare HMO | Admitting: Physical Medicine & Rehabilitation

## 2015-03-26 ENCOUNTER — Encounter: Payer: Self-pay | Admitting: Physical Medicine & Rehabilitation

## 2015-03-26 VITALS — BP 120/73 | HR 72

## 2015-03-26 DIAGNOSIS — S14129A Central cord syndrome at unspecified level of cervical spinal cord, initial encounter: Secondary | ICD-10-CM

## 2015-03-26 DIAGNOSIS — R29818 Other symptoms and signs involving the nervous system: Secondary | ICD-10-CM

## 2015-03-26 DIAGNOSIS — R29898 Other symptoms and signs involving the musculoskeletal system: Secondary | ICD-10-CM
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
## 2015-03-28 DIAGNOSIS — R29818 Other symptoms and signs involving the nervous system: Secondary | ICD-10-CM | POA: Insufficient documentation

## 2015-03-28 DIAGNOSIS — R29898 Other symptoms and signs involving the musculoskeletal system: Secondary | ICD-10-CM

## 2015-04-05 ENCOUNTER — Ambulatory Visit (INDEPENDENT_AMBULATORY_CARE_PROVIDER_SITE_OTHER): Payer: Medicare HMO | Admitting: Internal Medicine

## 2015-04-05 ENCOUNTER — Encounter: Payer: Self-pay | Admitting: Internal Medicine

## 2015-04-05 VITALS — BP 124/74 | HR 56 | Temp 98.1°F | Ht 72.0 in | Wt 224.1 lb

## 2015-04-05 DIAGNOSIS — Z23 Encounter for immunization: Secondary | ICD-10-CM

## 2015-04-05 DIAGNOSIS — F418 Other specified anxiety disorders: Secondary | ICD-10-CM

## 2015-04-05 DIAGNOSIS — D649 Anemia, unspecified: Secondary | ICD-10-CM | POA: Diagnosis not present

## 2015-04-05 DIAGNOSIS — I10 Essential (primary) hypertension: Secondary | ICD-10-CM

## 2015-04-05 DIAGNOSIS — E119 Type 2 diabetes mellitus without complications: Secondary | ICD-10-CM

## 2015-04-05 DIAGNOSIS — F329 Major depressive disorder, single episode, unspecified: Secondary | ICD-10-CM

## 2015-04-05 DIAGNOSIS — F32A Depression, unspecified: Secondary | ICD-10-CM

## 2015-04-05 DIAGNOSIS — R69 Illness, unspecified: Secondary | ICD-10-CM | POA: Diagnosis not present

## 2015-04-05 DIAGNOSIS — F419 Anxiety disorder, unspecified: Secondary | ICD-10-CM

## 2015-04-05 DIAGNOSIS — Z09 Encounter for follow-up examination after completed treatment for conditions other than malignant neoplasm: Secondary | ICD-10-CM

## 2015-04-05 LAB — HEMOGLOBIN A1C: Hgb A1c MFr Bld: 6.2 % (ref 4.6–6.5)

## 2015-04-05 LAB — BASIC METABOLIC PANEL
BUN: 23 mg/dL (ref 6–23)
CO2: 18 mEq/L — ABNORMAL LOW (ref 19–32)
Calcium: 9.8 mg/dL (ref 8.4–10.5)
Chloride: 107 mEq/L (ref 96–112)
Creatinine, Ser: 0.86 mg/dL (ref 0.40–1.50)
GFR: 93.27 mL/min (ref >60.00)
Glucose, Bld: 117 mg/dL — ABNORMAL HIGH (ref 70–99)
POTASSIUM: 5.3 meq/L — AB (ref 3.5–5.1)
Sodium: 138 mEq/L (ref 135–145)

## 2015-04-05 LAB — FERRITIN: Ferritin: 94.7 ng/mL (ref 22.0–322.0)

## 2015-04-05 LAB — IRON: Iron: 103 ug/dL (ref 42–165)

## 2015-04-05 LAB — HEMOGLOBIN: HEMOGLOBIN: 15.6 g/dL (ref 13.0–17.0)

## 2015-04-05 MED ORDER — ESCITALOPRAM OXALATE 20 MG PO TABS: 20.0000 mg | ORAL_TABLET | Freq: Every day | ORAL | Status: DC
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
## 2015-04-05 NOTE — Progress Notes (Signed)
Pre visit review using our clinic review tool, if applicable. No additional management support is needed unless otherwise documented below in the visit note. 

## 2015-04-05 NOTE — Patient Instructions (Addendum)
Get your blood work before you leave       Next visit  for a physical exam, fasting, in 4 months  Please schedule an appointment at the front desk

## 2015-04-07 DIAGNOSIS — Z09 Encounter for follow-up examination after completed treatment for conditions other than malignant neoplasm: Secondary | ICD-10-CM | POA: Insufficient documentation

## 2015-04-07 NOTE — Assessment & Plan Note (Signed)
DM: Check A1c, recommend to see the eye doctor HTN: Well-controlled, check a BMP Anemia: Still on iron supplements, check labs. Depending on results we'll discontinue supplements GERD: Continue PPIs, watch diet. Depression anxiety: Reports he is not completely well, PHQ 9scored #7. Previously #20, consequently he has improved, nevertheless he feels needs more help: we will increase Lexapro to 20 mg, again provided information about our counselors which he is interested. MSK: Celebrex wears off before 24 hours, we discuss hydrocodone, declined. Primary care: Flu shot today RTC 4 months, CPX

## 2015-04-08 NOTE — Addendum Note (Signed)
Addended by: Damita Dunnings D on: 04/08/2015 10:19 AM   Modules accepted: Orders, Medications

## 2015-05-13 ENCOUNTER — Encounter: Payer: Self-pay | Admitting: Internal Medicine

## 2015-06-03 ENCOUNTER — Encounter: Payer: Self-pay | Admitting: Internal Medicine

## 2015-06-04 ENCOUNTER — Other Ambulatory Visit: Payer: Self-pay | Admitting: Internal Medicine

## 2015-06-04 DIAGNOSIS — M158 Other polyosteoarthritis: Secondary | ICD-10-CM

## 2015-06-13 DIAGNOSIS — Z6828 Body mass index (BMI) 28.0-28.9, adult: Secondary | ICD-10-CM | POA: Diagnosis not present

## 2015-06-13 DIAGNOSIS — M5137 Other intervertebral disc degeneration, lumbosacral region: Secondary | ICD-10-CM | POA: Diagnosis not present

## 2015-06-13 DIAGNOSIS — M4806 Spinal stenosis, lumbar region: Secondary | ICD-10-CM | POA: Diagnosis not present

## 2015-06-21 DIAGNOSIS — M5137 Other intervertebral disc degeneration, lumbosacral region: Secondary | ICD-10-CM | POA: Diagnosis not present

## 2015-06-21 DIAGNOSIS — M4802 Spinal stenosis, cervical region: Secondary | ICD-10-CM | POA: Diagnosis not present

## 2015-06-21 DIAGNOSIS — M5023 Other cervical disc displacement, cervicothoracic region: Secondary | ICD-10-CM | POA: Diagnosis not present

## 2015-06-28 ENCOUNTER — Other Ambulatory Visit: Payer: Self-pay | Admitting: Neurosurgery

## 2015-06-28 DIAGNOSIS — M5137 Other intervertebral disc degeneration, lumbosacral region: Secondary | ICD-10-CM | POA: Diagnosis not present

## 2015-07-03 ENCOUNTER — Other Ambulatory Visit: Payer: Self-pay | Admitting: Neurosurgery

## 2015-07-03 NOTE — Pre-Procedure Instructions (Signed)
    ALDER RIESGO  07/03/2015      [id number] - [place], [place] AT [facility] [address] [place] [address] [place] [address] Phone: [phone] Fax: [phone]    Your procedure is scheduled on Monday, March 13th, 2017.  Report to [facility] Admitting at 5:30 A.M.  Call this number if you have problems the morning of surgery:  [phone]   Remember:  Do not eat food or drink liquids after midnight.   Take these medicines the morning of surgery with A SIP OF WATER: Escitalopram (Lexapro), Esomeprazole (Nexium).  Stop taking: Celecoxib (Celebrex), Aspirin, NSAIDS, Aleve, Naproxen, Ibuprofen, Advil, Motrin, BC's, Goody's, Fish oil, all herbal medications, and all vitamins.    Do not wear jewelry.  Do not wear lotions, powders, or colognes.  You may NOT wear deodorant.  Men may shave face and neck.  Do not bring valuables to the hospital.   [facility] is not responsible for any belongings or valuables.  Contacts, dentures or bridgework may not be worn into surgery.  Leave your suitcase in the car.  After surgery it may be brought to your room.  For patients admitted to the hospital, discharge time will be determined by your treatment team.  Patients discharged the day of surgery will not be allowed to drive home.   Special instructions:  See attached.   Please read over the following fact sheets that you were given. Pain Booklet, Coughing and Deep Breathing, MRSA Information and Surgical Site Infection Prevention

## 2015-07-04 ENCOUNTER — Encounter (HOSPITAL_COMMUNITY)
Admission: RE | Admit: 2015-07-04 | Discharge: 2015-07-04 | Disposition: A | Discharge status: Home / Self Care | Payer: Medicare HMO | Source: Ambulatory Visit | Attending: Neurosurgery | Admitting: Neurosurgery

## 2015-07-04 ENCOUNTER — Encounter (HOSPITAL_COMMUNITY): Payer: Self-pay

## 2015-07-04 DIAGNOSIS — R001 Bradycardia, unspecified: Secondary | ICD-10-CM | POA: Insufficient documentation

## 2015-07-04 DIAGNOSIS — Z01812 Encounter for preprocedural laboratory examination: Secondary | ICD-10-CM | POA: Diagnosis not present

## 2015-07-04 DIAGNOSIS — Z01818 Encounter for other preprocedural examination: Secondary | ICD-10-CM | POA: Diagnosis not present

## 2015-07-04 DIAGNOSIS — I10 Essential (primary) hypertension: Secondary | ICD-10-CM | POA: Diagnosis not present

## 2015-07-04 HISTORY — DX: Weakness: R53.1

## 2015-07-04 HISTORY — DX: Urgency of urination: R39.15

## 2015-07-04 HISTORY — DX: Unspecified hearing loss, left ear: H91.92

## 2015-07-04 LAB — COMPREHENSIVE METABOLIC PANEL
ALBUMIN: 4.1 g/dL (ref 3.5–5.0)
ALK PHOS: 49 U/L (ref 38–126)
ALT: 25 U/L (ref 17–63)
ANION GAP: 11 (ref 5–15)
AST: 29 U/L (ref 15–41)
BILIRUBIN TOTAL: 0.9 mg/dL (ref 0.3–1.2)
BUN: 11 mg/dL (ref 6–20)
CALCIUM: 9.5 mg/dL (ref 8.9–10.3)
CO2: 23 mmol/L (ref 22–32)
Chloride: 102 mmol/L (ref 101–111)
Creatinine, Ser: 0.88 mg/dL (ref 0.61–1.24)
GFR calc non Af Amer: >60 mL/min (ref >60)
GLUCOSE: 106 mg/dL — AB (ref 65–99)
POTASSIUM: 4.6 mmol/L (ref 3.5–5.1)
SODIUM: 136 mmol/L (ref 135–145)
TOTAL PROTEIN: 7.2 g/dL (ref 6.5–8.1)

## 2015-07-04 LAB — CBC
HEMATOCRIT: 43.1 % (ref 39.0–52.0)
HEMOGLOBIN: 15.1 g/dL (ref 13.0–17.0)
MCH: 31.6 pg (ref 26.0–34.0)
MCHC: 35 g/dL (ref 30.0–36.0)
MCV: 90.2 fL (ref 78.0–100.0)
Platelets: 201 10*3/uL (ref 150–400)
RBC: 4.78 MIL/uL (ref 4.22–5.81)
RDW: 14.4 % (ref 11.5–15.5)
WBC: 5.7 10*3/uL (ref 4.0–10.5)

## 2015-07-04 LAB — SURGICAL PCR SCREEN
MRSA, PCR: NEGATIVE
Staphylococcus aureus: NEGATIVE

## 2015-07-04 NOTE — Progress Notes (Signed)
PCP - Dr. Kathlene November Cardiologist - denies  EKG - 07/04/15 CXR - denies  Echo- denies Stress test - 2014 Cardiac Cath - denies  Patient denies chest pain and shortness of breath at PAT appointment.

## 2015-07-08 ENCOUNTER — Inpatient Hospital Stay (HOSPITAL_COMMUNITY)
Admission: RE | Admit: 2015-07-08 | Discharge: 2015-07-09 | DRG: 520 | Disposition: A | Discharge status: Home / Self Care | Payer: Medicare HMO | Source: Ambulatory Visit | Attending: Neurosurgery | Admitting: Neurosurgery

## 2015-07-08 ENCOUNTER — Encounter (HOSPITAL_COMMUNITY)
Admission: RE | Disposition: A | Discharge status: Home / Self Care | Payer: Self-pay | Source: Ambulatory Visit | Attending: Neurosurgery

## 2015-07-08 ENCOUNTER — Inpatient Hospital Stay (HOSPITAL_COMMUNITY): Payer: Medicare HMO | Admitting: Certified Registered Nurse Anesthetist

## 2015-07-08 ENCOUNTER — Inpatient Hospital Stay (HOSPITAL_COMMUNITY): Payer: Medicare HMO

## 2015-07-08 ENCOUNTER — Encounter (HOSPITAL_COMMUNITY): Payer: Self-pay | Admitting: *Deleted

## 2015-07-08 DIAGNOSIS — I10 Essential (primary) hypertension: Secondary | ICD-10-CM | POA: Diagnosis present

## 2015-07-08 DIAGNOSIS — Z96643 Presence of artificial hip joint, bilateral: Secondary | ICD-10-CM | POA: Diagnosis present

## 2015-07-08 DIAGNOSIS — Z791 Long term (current) use of non-steroidal anti-inflammatories (NSAID): Secondary | ICD-10-CM | POA: Diagnosis not present

## 2015-07-08 DIAGNOSIS — Z96641 Presence of right artificial hip joint: Secondary | ICD-10-CM | POA: Diagnosis not present

## 2015-07-08 DIAGNOSIS — R69 Illness, unspecified: Secondary | ICD-10-CM | POA: Diagnosis not present

## 2015-07-08 DIAGNOSIS — M5136 Other intervertebral disc degeneration, lumbar region: Secondary | ICD-10-CM | POA: Diagnosis not present

## 2015-07-08 DIAGNOSIS — M4806 Spinal stenosis, lumbar region: Principal | ICD-10-CM | POA: Diagnosis present

## 2015-07-08 DIAGNOSIS — K219 Gastro-esophageal reflux disease without esophagitis: Secondary | ICD-10-CM | POA: Diagnosis present

## 2015-07-08 DIAGNOSIS — Z87891 Personal history of nicotine dependence: Secondary | ICD-10-CM | POA: Diagnosis not present

## 2015-07-08 DIAGNOSIS — Z96651 Presence of right artificial knee joint: Secondary | ICD-10-CM | POA: Diagnosis present

## 2015-07-08 DIAGNOSIS — M545 Low back pain: Secondary | ICD-10-CM | POA: Diagnosis present

## 2015-07-08 DIAGNOSIS — K2270 Barrett's esophagus without dysplasia: Secondary | ICD-10-CM | POA: Diagnosis present

## 2015-07-08 DIAGNOSIS — M5116 Intervertebral disc disorders with radiculopathy, lumbar region: Secondary | ICD-10-CM | POA: Diagnosis present

## 2015-07-08 DIAGNOSIS — M48061 Spinal stenosis, lumbar region without neurogenic claudication: Secondary | ICD-10-CM | POA: Diagnosis present

## 2015-07-08 DIAGNOSIS — H9192 Unspecified hearing loss, left ear: Secondary | ICD-10-CM | POA: Diagnosis not present

## 2015-07-08 DIAGNOSIS — Z9889 Other specified postprocedural states: Secondary | ICD-10-CM | POA: Diagnosis not present

## 2015-07-08 DIAGNOSIS — F329 Major depressive disorder, single episode, unspecified: Secondary | ICD-10-CM | POA: Diagnosis present

## 2015-07-08 DIAGNOSIS — Z419 Encounter for procedure for purposes other than remedying health state, unspecified: Secondary | ICD-10-CM

## 2015-07-08 DIAGNOSIS — D649 Anemia, unspecified: Secondary | ICD-10-CM | POA: Diagnosis not present

## 2015-07-08 HISTORY — PX: LUMBAR LAMINECTOMY/DECOMPRESSION MICRODISCECTOMY: SHX5026

## 2015-07-08 SURGERY — LUMBAR LAMINECTOMY/DECOMPRESSION MICRODISCECTOMY 3 LEVELS
Anesthesia: General | Site: Back

## 2015-07-08 MED ORDER — DEXAMETHASONE SODIUM PHOSPHATE 10 MG/ML IJ SOLN
10.0000 mg | INTRAMUSCULAR | Status: AC
  Administered 2015-07-08: 10 mg via INTRAVENOUS
  Filled 2015-07-08: qty 1

## 2015-07-08 MED ORDER — EPHEDRINE SULFATE 50 MG/ML IJ SOLN
INTRAMUSCULAR | Status: AC
  Filled 2015-07-08: qty 1

## 2015-07-08 MED ORDER — SODIUM CHLORIDE 0.9% FLUSH: 3.0000 mL | INTRAVENOUS | Status: DC | PRN

## 2015-07-08 MED ORDER — PROMETHAZINE HCL 25 MG/ML IJ SOLN: 6.2500 mg | INTRAMUSCULAR | Status: DC | PRN

## 2015-07-08 MED ORDER — EPHEDRINE SULFATE 50 MG/ML IJ SOLN
INTRAMUSCULAR | Status: DC | PRN
  Administered 2015-07-08 (×4): 5 mg via INTRAVENOUS

## 2015-07-08 MED ORDER — LACTATED RINGERS IV SOLN: INTRAVENOUS | Status: DC

## 2015-07-08 MED ORDER — MEPERIDINE HCL 25 MG/ML IJ SOLN: 6.2500 mg | INTRAMUSCULAR | Status: DC | PRN

## 2015-07-08 MED ORDER — PHENYLEPHRINE HCL 10 MG/ML IJ SOLN
10.0000 mg | INTRAVENOUS | Status: DC | PRN
  Administered 2015-07-08: 20 ug/min via INTRAVENOUS

## 2015-07-08 MED ORDER — NEOSTIGMINE METHYLSULFATE 10 MG/10ML IV SOLN
INTRAVENOUS | Status: AC
  Filled 2015-07-08: qty 1

## 2015-07-08 MED ORDER — ONDANSETRON HCL 4 MG/2ML IJ SOLN
INTRAMUSCULAR | Status: AC
Start: 2015-07-08 — End: 2015-07-08
  Filled 2015-07-08: qty 2

## 2015-07-08 MED ORDER — MENTHOL 3 MG MT LOZG: 1.0000 | LOZENGE | OROMUCOSAL | Status: DC | PRN

## 2015-07-08 MED ORDER — CEFAZOLIN SODIUM-DEXTROSE 2-3 GM-% IV SOLR
2.0000 g | INTRAVENOUS | Status: AC
  Administered 2015-07-08: 2 g via INTRAVENOUS
  Filled 2015-07-08: qty 50

## 2015-07-08 MED ORDER — FENTANYL CITRATE (PF) 100 MCG/2ML IJ SOLN
INTRAMUSCULAR | Status: DC | PRN
  Administered 2015-07-08: 100 ug via INTRAVENOUS
  Administered 2015-07-08 (×2): 50 ug via INTRAVENOUS

## 2015-07-08 MED ORDER — LIDOCAINE HCL (CARDIAC) 20 MG/ML IV SOLN
INTRAVENOUS | Status: AC
  Filled 2015-07-08: qty 5

## 2015-07-08 MED ORDER — NEOSTIGMINE METHYLSULFATE 10 MG/10ML IV SOLN
INTRAVENOUS | Status: DC | PRN
  Administered 2015-07-08: 4 mg via INTRAVENOUS

## 2015-07-08 MED ORDER — THROMBIN 5000 UNITS EX SOLR
CUTANEOUS | Status: DC | PRN
  Administered 2015-07-08 (×2): 5000 [IU] via TOPICAL

## 2015-07-08 MED ORDER — HEMOSTATIC AGENTS (NO CHARGE) OPTIME
TOPICAL | Status: DC | PRN
  Administered 2015-07-08: 1 via TOPICAL

## 2015-07-08 MED ORDER — ESCITALOPRAM OXALATE 20 MG PO TABS
20.0000 mg | ORAL_TABLET | Freq: Every day | ORAL | Status: DC
  Filled 2015-07-08 (×2): qty 1

## 2015-07-08 MED ORDER — PHENOL 1.4 % MT LIQD: 1.0000 | OROMUCOSAL | Status: DC | PRN

## 2015-07-08 MED ORDER — GLYCOPYRROLATE 0.2 MG/ML IJ SOLN
INTRAMUSCULAR | Status: DC | PRN
  Administered 2015-07-08: 0.6 mg via INTRAVENOUS

## 2015-07-08 MED ORDER — LIDOCAINE-EPINEPHRINE 1 %-1:100000 IJ SOLN
INTRAMUSCULAR | Status: DC | PRN
  Administered 2015-07-08: 10 mL

## 2015-07-08 MED ORDER — PROPOFOL 10 MG/ML IV BOLUS
INTRAVENOUS | Status: AC
  Filled 2015-07-08: qty 20

## 2015-07-08 MED ORDER — CELECOXIB 200 MG PO CAPS: 200.0000 mg | ORAL_CAPSULE | Freq: Every day | ORAL | Status: DC

## 2015-07-08 MED ORDER — BENAZEPRIL HCL 40 MG PO TABS
40.0000 mg | ORAL_TABLET | Freq: Every day | ORAL | Status: DC
  Filled 2015-07-08 (×2): qty 1

## 2015-07-08 MED ORDER — SODIUM CHLORIDE 0.9% FLUSH
3.0000 mL | Freq: Two times a day (BID) | INTRAVENOUS | Status: DC
  Administered 2015-07-08: 3 mL via INTRAVENOUS

## 2015-07-08 MED ORDER — ONDANSETRON HCL 4 MG/2ML IJ SOLN: 4.0000 mg | INTRAMUSCULAR | Status: DC | PRN

## 2015-07-08 MED ORDER — SODIUM CHLORIDE 0.9 % IV SOLN: 250.0000 mL | INTRAVENOUS | Status: DC

## 2015-07-08 MED ORDER — CYCLOBENZAPRINE HCL 10 MG PO TABS: 10.0000 mg | ORAL_TABLET | Freq: Three times a day (TID) | ORAL | Status: DC | PRN

## 2015-07-08 MED ORDER — ACETAMINOPHEN 325 MG PO TABS
650.0000 mg | ORAL_TABLET | ORAL | Status: DC | PRN
Start: 2015-07-08 — End: 2015-07-09

## 2015-07-08 MED ORDER — LIDOCAINE HCL (CARDIAC) 20 MG/ML IV SOLN
INTRAVENOUS | Status: DC | PRN
  Administered 2015-07-08: 50 mg via INTRAVENOUS

## 2015-07-08 MED ORDER — FENTANYL CITRATE (PF) 100 MCG/2ML IJ SOLN: 25.0000 ug | INTRAMUSCULAR | Status: DC | PRN

## 2015-07-08 MED ORDER — GLYCOPYRROLATE 0.2 MG/ML IJ SOLN
INTRAMUSCULAR | Status: AC
  Filled 2015-07-08: qty 3

## 2015-07-08 MED ORDER — OXYCODONE-ACETAMINOPHEN 5-325 MG PO TABS
1.0000 | ORAL_TABLET | ORAL | Status: DC | PRN
  Administered 2015-07-08: 2 via ORAL
  Filled 2015-07-08: qty 2

## 2015-07-08 MED ORDER — LACTATED RINGERS IV SOLN
INTRAVENOUS | Status: DC | PRN
  Administered 2015-07-08 (×2): via INTRAVENOUS

## 2015-07-08 MED ORDER — BUPIVACAINE HCL (PF) 0.25 % IJ SOLN
INTRAMUSCULAR | Status: DC | PRN
  Administered 2015-07-08: 20 mL

## 2015-07-08 MED ORDER — 0.9 % SODIUM CHLORIDE (POUR BTL) OPTIME
TOPICAL | Status: DC | PRN
  Administered 2015-07-08: 1000 mL

## 2015-07-08 MED ORDER — HYDROMORPHONE HCL 1 MG/ML IJ SOLN
0.5000 mg | INTRAMUSCULAR | Status: DC | PRN
  Administered 2015-07-08: 1 mg via INTRAVENOUS
  Filled 2015-07-08: qty 1

## 2015-07-08 MED ORDER — ADULT MULTIVITAMIN W/MINERALS CH: 1.0000 | ORAL_TABLET | Freq: Every day | ORAL | Status: DC

## 2015-07-08 MED ORDER — SODIUM CHLORIDE 0.9 % IR SOLN
Status: DC | PRN
  Administered 2015-07-08: 07:00:00

## 2015-07-08 MED ORDER — ROCURONIUM BROMIDE 100 MG/10ML IV SOLN
INTRAVENOUS | Status: DC | PRN
  Administered 2015-07-08: 40 mg via INTRAVENOUS
  Administered 2015-07-08 (×3): 10 mg via INTRAVENOUS

## 2015-07-08 MED ORDER — FENTANYL CITRATE (PF) 250 MCG/5ML IJ SOLN
INTRAMUSCULAR | Status: AC
  Filled 2015-07-08: qty 5

## 2015-07-08 MED ORDER — CEFAZOLIN SODIUM 1-5 GM-% IV SOLN
1.0000 g | Freq: Three times a day (TID) | INTRAVENOUS | Status: AC
  Administered 2015-07-08 (×2): 1 g via INTRAVENOUS
  Filled 2015-07-08 (×2): qty 50

## 2015-07-08 MED ORDER — PROPOFOL 10 MG/ML IV BOLUS
INTRAVENOUS | Status: DC | PRN
  Administered 2015-07-08: 150 mg via INTRAVENOUS

## 2015-07-08 MED ORDER — ACETAMINOPHEN 650 MG RE SUPP
650.0000 mg | RECTAL | Status: DC | PRN
Start: 2015-07-08 — End: 2015-07-09

## 2015-07-08 MED ORDER — MIDAZOLAM HCL 2 MG/2ML IJ SOLN
INTRAMUSCULAR | Status: AC
  Filled 2015-07-08: qty 2

## 2015-07-08 MED ORDER — ONDANSETRON HCL 4 MG/2ML IJ SOLN
INTRAMUSCULAR | Status: DC | PRN
  Administered 2015-07-08: 4 mg via INTRAVENOUS

## 2015-07-08 MED ORDER — VITAMIN C 500 MG PO TABS
500.0000 mg | ORAL_TABLET | Freq: Every day | ORAL | Status: DC
  Filled 2015-07-08 (×2): qty 1

## 2015-07-08 MED ORDER — VANCOMYCIN HCL 1000 MG IV SOLR
INTRAVENOUS | Status: AC
  Filled 2015-07-08: qty 1000

## 2015-07-08 MED ORDER — THROMBIN 5000 UNITS EX SOLR
OROMUCOSAL | Status: DC | PRN
  Administered 2015-07-08: 09:00:00 via TOPICAL

## 2015-07-08 MED ORDER — ROCURONIUM BROMIDE 50 MG/5ML IV SOLN
INTRAVENOUS | Status: AC
  Filled 2015-07-08: qty 1

## 2015-07-08 MED ORDER — VITAMIN E 45 MG (100 UNIT) PO CAPS
600.0000 [IU] | ORAL_CAPSULE | Freq: Every day | ORAL | Status: DC
  Filled 2015-07-08 (×2): qty 2

## 2015-07-08 MED ORDER — PANTOPRAZOLE SODIUM 40 MG PO TBEC: 40.0000 mg | DELAYED_RELEASE_TABLET | Freq: Every day | ORAL | Status: DC

## 2015-07-08 SURGICAL SUPPLY — 58 items
APL SKNCLS STERI-STRIP NONHPOA (GAUZE/BANDAGES/DRESSINGS) ×1
BAG DECANTER FOR FLEXI CONT (MISCELLANEOUS) ×2 IMPLANT
BENZOIN TINCTURE PRP APPL 2/3 (GAUZE/BANDAGES/DRESSINGS) ×2 IMPLANT
BLADE CLIPPER SURG (BLADE) ×1 IMPLANT
BLADE SURG 11 STRL SS (BLADE) ×2 IMPLANT
BRUSH SCRUB EZ PLAIN DRY (MISCELLANEOUS) ×2 IMPLANT
BUR MATCHSTICK NEURO 3.0 LAGG (BURR) ×2 IMPLANT
BUR PRECISION FLUTE 6.0 (BURR) ×2 IMPLANT
CANISTER SUCT 3000ML PPV (MISCELLANEOUS) ×2 IMPLANT
DECANTER SPIKE VIAL GLASS SM (MISCELLANEOUS) ×2 IMPLANT
DRAPE LAPAROTOMY 100X72X124 (DRAPES) ×2 IMPLANT
DRAPE MICROSCOPE LEICA (MISCELLANEOUS) ×2 IMPLANT
DRAPE POUCH INSTRU U-SHP 10X18 (DRAPES) ×2 IMPLANT
DRAPE PROXIMA HALF (DRAPES) IMPLANT
DRAPE SURG 17X23 STRL (DRAPES) ×2 IMPLANT
DRSG OPSITE 4X5.5 SM (GAUZE/BANDAGES/DRESSINGS) ×1 IMPLANT
DRSG OPSITE POSTOP 4X8 (GAUZE/BANDAGES/DRESSINGS) ×1 IMPLANT
DURAPREP 26ML APPLICATOR (WOUND CARE) ×2 IMPLANT
ELECT REM PT RETURN 9FT ADLT (ELECTROSURGICAL) ×2
ELECTRODE REM PT RTRN 9FT ADLT (ELECTROSURGICAL) ×1 IMPLANT
EVACUATOR 1/8 PVC DRAIN (DRAIN) ×1 IMPLANT
GAUZE SPONGE 4X4 12PLY STRL (GAUZE/BANDAGES/DRESSINGS) ×2 IMPLANT
GAUZE SPONGE 4X4 16PLY XRAY LF (GAUZE/BANDAGES/DRESSINGS) ×1 IMPLANT
GLOVE BIO SURGEON STRL SZ7 (GLOVE) ×1 IMPLANT
GLOVE BIO SURGEON STRL SZ8 (GLOVE) ×2 IMPLANT
GLOVE ECLIPSE 7.5 STRL STRAW (GLOVE) IMPLANT
GLOVE ECLIPSE 9.0 STRL (GLOVE) ×1 IMPLANT
GLOVE EXAM NITRILE LRG STRL (GLOVE) IMPLANT
GLOVE EXAM NITRILE MD LF STRL (GLOVE) IMPLANT
GLOVE EXAM NITRILE XL STR (GLOVE) IMPLANT
GLOVE EXAM NITRILE XS STR PU (GLOVE) IMPLANT
GLOVE INDICATOR 7.5 STRL GRN (GLOVE) ×3 IMPLANT
GLOVE INDICATOR 8.5 STRL (GLOVE) ×2 IMPLANT
GOWN STRL REUS W/ TWL LRG LVL3 (GOWN DISPOSABLE) ×1 IMPLANT
GOWN STRL REUS W/ TWL XL LVL3 (GOWN DISPOSABLE) ×2 IMPLANT
GOWN STRL REUS W/TWL 2XL LVL3 (GOWN DISPOSABLE) IMPLANT
GOWN STRL REUS W/TWL LRG LVL3 (GOWN DISPOSABLE) ×2
GOWN STRL REUS W/TWL XL LVL3 (GOWN DISPOSABLE) ×4
HEMOSTAT POWDER SURGIFOAM 1G (HEMOSTASIS) ×1 IMPLANT
KIT BASIN OR (CUSTOM PROCEDURE TRAY) ×2 IMPLANT
KIT ROOM TURNOVER OR (KITS) ×2 IMPLANT
LIQUID BAND (GAUZE/BANDAGES/DRESSINGS) ×2 IMPLANT
NDL SPNL 22GX3.5 QUINCKE BK (NEEDLE) ×1 IMPLANT
NEEDLE HYPO 22GX1.5 SAFETY (NEEDLE) ×2 IMPLANT
NEEDLE SPNL 22GX3.5 QUINCKE BK (NEEDLE) IMPLANT
NS IRRIG 1000ML POUR BTL (IV SOLUTION) ×2 IMPLANT
PACK LAMINECTOMY NEURO (CUSTOM PROCEDURE TRAY) ×2 IMPLANT
RUBBERBAND STERILE (MISCELLANEOUS) ×4 IMPLANT
SPONGE SURGIFOAM ABS GEL SZ50 (HEMOSTASIS) ×2 IMPLANT
STRIP CLOSURE SKIN 1/2X4 (GAUZE/BANDAGES/DRESSINGS) ×2 IMPLANT
SUT VIC AB 0 CT1 18XCR BRD8 (SUTURE) ×1 IMPLANT
SUT VIC AB 0 CT1 8-18 (SUTURE) ×2
SUT VIC AB 2-0 CT1 18 (SUTURE) ×2 IMPLANT
SUT VIC AB 4-0 PS2 27 (SUTURE) ×2 IMPLANT
SYR BULB IRRIGATION 50ML (SYRINGE) ×1 IMPLANT
TOWEL OR 17X24 6PK STRL BLUE (TOWEL DISPOSABLE) ×2 IMPLANT
TOWEL OR 17X26 10 PK STRL BLUE (TOWEL DISPOSABLE) ×2 IMPLANT
WATER STERILE IRR 1000ML POUR (IV SOLUTION) ×2 IMPLANT
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
## 2015-07-08 NOTE — Transfer of Care (Signed)
Immediate Anesthesia Transfer of Care Note  Patient: William Norton  Procedure(s) Performed: Procedure(s): Laminectomy and Foraminotomy - Lumbar two-three lumbar three-four, lumbar four-five (N/A)  Patient Location: PACU  Anesthesia Type:General  Level of Consciousness: awake  Airway & Oxygen Therapy: Patient Spontanous Breathing and Patient connected to nasal cannula oxygen  Post-op Assessment: Report given to RN, Post -op Vital signs reviewed and stable and Patient moving all extremities X 4  Post vital signs: stable  Last Vitals:  Filed Vitals:   07/08/15 0620  BP: 118/78  Pulse: 52  Temp: 36.9 C  Resp: 20    Complications: No apparent anesthesia complications

## 2015-07-08 NOTE — Anesthesia Preprocedure Evaluation (Addendum)
Anesthesia Evaluation  Patient identified by MRN, date of birth, ID band Patient awake    Reviewed: Allergy & Precautions, NPO status , Patient's Chart, lab work & pertinent test results  Airway Mallampati: II  TM Distance: >3 FB Neck ROM: Full    Dental  (+) Teeth Intact   Pulmonary former smoker,    breath sounds clear to auscultation       Cardiovascular hypertension, Pt. on medications  Rhythm:Regular Rate:Normal     Neuro/Psych PSYCHIATRIC DISORDERS Depression  Neuromuscular disease    GI/Hepatic GERD  ,(+) Hepatitis -  Endo/Other  diabetes  Renal/GU Renal InsufficiencyRenal disease  negative genitourinary   Musculoskeletal  (+) Arthritis ,   Abdominal   Peds negative pediatric ROS (+)  Hematology   Anesthesia Other Findings   Reproductive/Obstetrics negative OB ROS                            Lab Results  Component Value Date   WBC 5.7 07/04/2015   HGB 15.1 07/04/2015   HCT 43.1 07/04/2015   MCV 90.2 07/04/2015   PLT 201 07/04/2015   Lab Results  Component Value Date   CREATININE 0.88 07/04/2015   BUN 11 07/04/2015   NA 136 07/04/2015   K 4.6 07/04/2015   CL 102 07/04/2015   CO2 23 07/04/2015   Lab Results  Component Value Date   INR 0.97 10/11/2012   INR 0.93 09/07/2011   INR 1.08 09/19/2009   06/2015: EKG: sinus bradycardia.   Anesthesia Physical Anesthesia Plan  ASA: III  Anesthesia Plan: General   Post-op Pain Management:    Induction: Intravenous  Airway Management Planned: Oral ETT  Additional Equipment:   Intra-op Plan:   Post-operative Plan: Extubation in OR  Informed Consent: I have reviewed the patients History and Physical, chart, labs and discussed the procedure including the risks, benefits and alternatives for the proposed anesthesia with the patient or authorized representative who has indicated his/her understanding and acceptance.    Dental advisory given  Plan Discussed with: CRNA  Anesthesia Plan Comments:         Anesthesia Quick Evaluation

## 2015-07-08 NOTE — Anesthesia Postprocedure Evaluation (Signed)
Anesthesia Post Note  Patient: William Norton  Procedure(s) Performed: Procedure(s) (LRB): Laminectomy and Foraminotomy - Lumbar two-three lumbar three-four, lumbar four-five (N/A)  Patient location during evaluation: PACU Anesthesia Type: General Level of consciousness: awake and alert Pain management: pain level controlled Vital Signs Assessment: post-procedure vital signs reviewed and stable Respiratory status: spontaneous breathing, nonlabored ventilation, respiratory function stable and patient connected to nasal cannula oxygen Cardiovascular status: blood pressure returned to baseline and stable Postop Assessment: no signs of nausea or vomiting Anesthetic complications: no    Last Vitals:  Filed Vitals:   07/08/15 1055 07/08/15 1102  BP: 122/79   Pulse: 75 76  Temp:  36.7 C  Resp: 12 10    Last Pain:  Filed Vitals:   07/08/15 1103  PainSc: [place] Tonny Isensee

## 2015-07-08 NOTE — Anesthesia Procedure Notes (Signed)
Procedure Name: Intubation Date/Time: 07/08/2015 7:40 AM Performed by: Rejeana Brock L Pre-anesthesia Checklist: Patient identified, Timeout performed, Emergency Drugs available, Suction available and Patient being monitored Patient Re-evaluated:Patient Re-evaluated prior to inductionOxygen Delivery Method: Circle system utilized Preoxygenation: Pre-oxygenation with 100% oxygen Intubation Type: IV induction Ventilation: Mask ventilation without difficulty and Oral airway inserted - appropriate to patient size Laryngoscope Size: Glidescope and 4 Grade View: Grade I Tube type: Oral Tube size: 7.5 mm Number of attempts: 1 Airway Equipment and Method: Stylet and Video-laryngoscopy Placement Confirmation: ETT inserted through vocal cords under direct vision,  positive ETCO2 and breath sounds checked- equal and bilateral Secured at: 23 cm Tube secured with: Tape Dental Injury: Teeth and Oropharynx as per pre-operative assessment

## 2015-07-08 NOTE — Op Note (Signed)
Preoperative diagnosis: Lumbar spinal stenosis and severe foraminal stenosis L2-3, L3-4 and L4-5 with herniated nucleus pulposus on the left at L4-5 and bilateral L3, L4, and left L5 radiculopathies  Postoperative diagnosis: Same  Procedure: #1 bilateral decompressive lumbar laminectomies partial medial facetectomies and foraminotomies at L2-3 and L3-4 with microscopic foraminotomies of the L3 and L4 nerve roots bilaterally  #2 left-sided decompressive lumbar partial medial facetectomy and foraminotomy L4-5 with microdiscectomy L4-5 and microscopic dissection of left L5 nerve root  Surgeon: Dominica Severin Suriah Peragine  Asst.: Mallie Mussel pool  Anesthesia: Gen.  EBL: Minimal  History of present illness: Patient is very pleasant 70 year old gentleman is a progress worsening back and bilateral leg pain and neurogenic claudication. Workup revealed severe spinal stenosis at L2-3 L3-4 and L4-5 etiology was marked facet arthropathy at L2-3 and L3-4 and facet arthropathy and a herniated disc stenosis on the left at L4-5. Due to patient's failed cervical treatment imaging findings and progression of clinical syndrome I recommended decompressive lumbar laminotomies at L2-3 L3-4 and left-sided decompression discectomy at L4-5. I extensively went over the risks and benefits of the operation with the patient as well as perioperative course expectations of outcome and alternatives of surgery he understands and agrees to proceed forward. Graf operative procedure: Patient brought into the or was induced on general anesthesia positioned prone the Wilson frame his back was prepped and draped in routine sterile fashion. After infiltration of 10 mL lidocaine with epi a midline incision was made and Bovie left car was used to calcification subperiosteal dissections care lamina of L2, L3, L4, and L5 on the left and to 3 and 4 on the right. Interoperative x-ray confirmed the location proper level so the entire spinous process of L3 was removed  central decompression was begun the inferior two thirds the spinous process of L2 was removed and the superior one third of the spinous process at L4 was removed. Central decompression was carried out MI there was marked facet arthropathy predominantly at L3-4 disc space causing severe hourglass compression of thecal sac this was also occurring at L2-3 and a less fashion. Marching underneath the medial gutter was under been decompress the proximal L3, L4 nerve roots bilaterally. Then laminotomy was also begun on the left at L4-5 marked hourglass compression of thecal sac was also present here medial facetectomy was performed under Joycelyn Rua illumination further under biting the medial gutter LAD indication of the L5 pedicle the L5 nerve root was dissected off the L5 pedicle. Large spurs were under been decompress and the dorsal aspect of the L5 nerve root disc space was identified large herniation was immediately had expressed several large fracture moved disc spaces entered and this was cleaned up due to rongeurs Epstein curettes. Atlantis Kitners no further stenosis on the thecal sac or proximal L5 nerve root then the maxilla was used to inspect the foraminotomies which were further unroofed at L3 and L4 and in the decompression all foramina L3 and L4 bilaterally and L5 on the left were decompressed. There was a Cobos see her good fixing space was maintained Gelfoam and Surgifoam was squirted over the dura the wound was approximated with interrupted Vicryl's after placement of a medium Hemovac drain and a running force septic or was used to close the skin and Dermabond benzo and Steri-Strips applied and a sterile dressing patent patient went covered in stable condition. At the end of the case all needle counts sponge counts were correct.

## 2015-07-08 NOTE — OR Nursing (Signed)
In and Out catheter done at end of procedure prior to extubation 300 cc clear yellow urine noted

## 2015-07-09 ENCOUNTER — Encounter (HOSPITAL_COMMUNITY): Payer: Self-pay | Admitting: Neurosurgery

## 2015-07-09 MED ORDER — OXYCODONE-ACETAMINOPHEN 5-325 MG PO TABS: 1.0000 | ORAL_TABLET | ORAL | Status: DC | PRN

## 2015-07-09 NOTE — Progress Notes (Signed)
Pt doing well. Pt and wife given D/C instructions with Rx, verbal understanding was provided. Pt's incision is clean and dry with no sign of infection. Pt's IV and Hemovac were removed prior to D/C. Pt D/C'd home via walking @ 0905 per MD order. Pt is stable @ D/C and has no other needs at this time. Holli Humbles, RN

## 2015-07-09 NOTE — Discharge Instructions (Signed)
No lifting no bending no twisting no driving a riding a car unless he is coming back and forth to see me. Keep the incision clean dry and intact. Remove the outer dressing in 2-3 days of the Steri-Strips on and intact. Cover the Steri-Strips with saran wrap for showers only.

## 2015-07-09 NOTE — Care Management (Signed)
Observation letter was given 07/08/15

## 2015-07-09 NOTE — Evaluation (Signed)
Physical Therapy Evaluation and Discharge Patient Details Name: William Norton MRN: [id number] DOB: Oct 26, 1945 Today's Date: 07/09/2015   History of Present Illness  Pt is a 70 y/o male who presents s/p L2-L5 lumbar laminectomy/decompression on 07/08/15.  Clinical Impression  Patient evaluated by Physical Therapy with no further acute PT needs identified. All education has been completed and the patient has no further questions. At the time of PT eval pt was able to perform transfers and ambulation with modified independence to supervision for safety. Pt has sufficient equipment and adaptive equipment available at home, and PT recommended use of the [facility] for community ambulation until overall balance is improved. No assist was required during session however pt appeared slightly unsteady at times. See below for any follow-up Physical Therapy or equipment needs. PT is signing off. Thank you for this referral.     Follow Up Recommendations Outpatient PT (When appropriate per post-op protocol)    Equipment Recommendations  None recommended by PT    Recommendations for Other Services       Precautions / Restrictions Precautions Precautions: Fall;Back Precaution Booklet Issued: Yes (comment) Precaution Comments: Reviewed handout and pt was cued for precautions during functional mobility.  Restrictions Weight Bearing Restrictions: No      Mobility  Bed Mobility Overal bed mobility: Modified Independent             General bed mobility comments: HOB flat and rails lowered to simulate home environment. Pt was able to transition to/from EOB without assist. Light VC's provided for technique.   Transfers Overall transfer level: Needs assistance Equipment used: None Transfers: Sit to/from Stand Sit to Stand: Modified independent (Device/Increase time)         General transfer comment: No assist required. Pt demonstrated good h and placement and posture during transitions to/from  sitting.   Ambulation/Gait Ambulation/Gait assistance: Supervision Ambulation Distance (Feet): 400 Feet Assistive device: None Gait Pattern/deviations: Step-through pattern;Decreased stride length;Trunk flexed Gait velocity: Decreased Gait velocity interpretation: Below normal speed for age/gender General Gait Details: VC's for improved posture and general safety. Noted occasional side step to accommodate for imbalance, however no assist was required for pt to recover. Pt reports he is walking better than at baseline. Recommended SPC for community ambulation.   Stairs            Wheelchair Mobility    Modified Rankin (Stroke Patients Only)       Balance Overall balance assessment: Needs assistance Sitting-balance support: Feet supported;No upper extremity supported Sitting balance-Leahy Scale: Good     Standing balance support: No upper extremity supported;During functional activity Standing balance-Leahy Scale: Fair                               Pertinent Vitals/Pain Pain Assessment: Faces Faces Pain Scale: Hurts little more Pain Location: Incision site Pain Descriptors / Indicators: Operative site guarding;Discomfort Pain Intervention(s): Limited activity within patient's tolerance;Monitored during session;Repositioned    Home Living Family/patient expects to be discharged to:: Private residence Living Arrangements: Spouse/significant other Available Help at Discharge: Family;Available 24 hours/day Type of Home: House Home Access: Stairs to enter Entrance Stairs-Rails: None Entrance Stairs-Number of Steps: "one 1/2 step" to enter through the threshold of the door Home Layout: One level Home Equipment: Walker - 2 wheels;Cane - single point;Bedside commode;Shower seat;Hand held shower head;Adaptive equipment      Prior Function Level of Independence: Independent  Hand Dominance   Dominant Hand: Right    Extremity/Trunk  Assessment   Upper Extremity Assessment: Overall WFL for tasks assessed           Lower Extremity Assessment: Generalized weakness      Cervical / Trunk Assessment: Other exceptions  Communication   Communication: No difficulties  Cognition Arousal/Alertness: Awake/alert Behavior During Therapy: WFL for tasks assessed/performed Overall Cognitive Status: Within Functional Limits for tasks assessed                      General Comments General comments (skin integrity, edema, etc.): Pt demonstrated a simulated tub transfer with supervision for safety. Pt was instructed to side step into the tub while holding on to the wall. No LOB noted.     Exercises        Assessment/Plan    PT Assessment Patient needs continued PT services  PT Diagnosis Difficulty walking;Acute pain   PT Problem List Decreased strength;Decreased range of motion;Decreased balance;Decreased activity tolerance;Decreased mobility;Decreased knowledge of use of DME;Decreased safety awareness;Decreased knowledge of precautions;Pain  PT Treatment Interventions DME instruction;Gait training;Stair training;Functional mobility training;Therapeutic activities;Therapeutic exercise;Neuromuscular re-education;Patient/family education   PT Goals (Current goals can be found in the Care Plan section) Acute Rehab PT Goals Patient Stated Goal: Return home today PT Goal Formulation: All assessment and education complete, DC therapy    Frequency Min 5X/week   Barriers to discharge        Co-evaluation               End of Session   Activity Tolerance: Patient tolerated treatment well Patient left: in chair;with call bell/phone within reach Nurse Communication: Mobility status         Time: [id number] PT Time Calculation (min) (ACUTE ONLY): 24 min   Charges:   PT Evaluation $PT Eval Moderate Complexity: 1 Procedure PT Treatments $Gait Training: 8-22 mins   PT G Codes:        Rolinda Roan 13-Jul-2015, 8:39 AM   Rolinda Roan, PT, DPT Acute Rehabilitation Services Pager: [phone]

## 2015-07-09 NOTE — Care Management Obs Status (Signed)
[place] NOTIFICATION   Patient Details  Name: William Norton MRN: [id number] Date of Birth: Dec 15, 1945   Medicare Observation Status Notification Given:  Yes  Your procedure does not meet for inpatient admission. You are hospital outpatient receiving observation services.   Ninfa Meeker, RN 07/09/2015, 10:53 AM

## 2015-07-09 NOTE — Discharge Summary (Signed)
  Physician Discharge Summary  Patient ID: William Norton MRN: [id number] DOB/AGE: 01-Feb-1946 70 y.o.  Admit date: 07/08/2015 Discharge date: 07/09/2015  Admission Diagnoses:Lumbar spinal stenosis L2-3 L3-4 with herniated nuclear pulposus L4-5  Discharge Diagnoses: Same Active Problems:   Spinal stenosis of lumbar region   Discharged Condition: good  Hospital Course: Patient admitted hospital underwent decompressive lumbar limiting L2-3, L3-4, L4-5 with discectomy at L4-5 postoperative patient did very well recovered in the floor on the floor he was angling and voiding spontaneously tolerating regular Stable for discharge on postop day 1.  Consults: Significant Diagnostic Studies: Treatments: Decompressive lumbar laminectomies L2-3 L3-4 and L4-5 with discectomy at L4-5 Discharge Exam: Blood pressure 110/67, pulse 65, temperature 97.8 F (36.6 C), temperature source Oral, resp. rate 18, height 5' 11.75" (1.822 m), weight 92.488 kg (203 lb 14.4 oz), SpO2 97 %. Strength 5 out of 5 wound clean dry and intact  Disposition: Home     Medication List    TAKE these medications        benazepril 40 MG tablet  Commonly known as:  LOTENSIN  Take 1 tablet daily.     celecoxib 200 MG capsule  Commonly known as:  CELEBREX  Take 1 tablet by mouth once daily.     escitalopram 20 MG tablet  Commonly known as:  LEXAPRO  Take 1 tablet (20 mg total) by mouth daily.     esomeprazole 40 MG capsule  Commonly known as:  NEXIUM  Take 40 mg by mouth daily at 12 noon.     multivitamin tablet  Take 1 tablet by mouth daily.     oxyCODONE-acetaminophen 5-325 MG tablet  Commonly known as:  PERCOCET/ROXICET  Take 1-2 tablets by mouth every 4 (four) hours as needed for moderate pain.     vitamin C 500 MG tablet  Commonly known as:  ASCORBIC ACID  Take 500 mg by mouth daily.     vitamin E 600 UNIT capsule  Take 600 Units by mouth daily.           Follow-up Information    Follow up  with [facility] P, MD.   Specialty:  Neurosurgery   Contact information:   [address] [place] [address] [phone]       Signed: Elaina Hoops 07/09/2015, 7:49 AM

## 2015-08-05 ENCOUNTER — Ambulatory Visit (INDEPENDENT_AMBULATORY_CARE_PROVIDER_SITE_OTHER): Payer: Medicare HMO | Admitting: Internal Medicine

## 2015-08-05 ENCOUNTER — Encounter: Payer: Self-pay | Admitting: Internal Medicine

## 2015-08-05 VITALS — BP 116/74 | HR 62 | Temp 97.7°F | Ht 72.0 in | Wt 197.0 lb

## 2015-08-05 DIAGNOSIS — K219 Gastro-esophageal reflux disease without esophagitis: Secondary | ICD-10-CM | POA: Diagnosis not present

## 2015-08-05 DIAGNOSIS — M158 Other polyosteoarthritis: Secondary | ICD-10-CM

## 2015-08-05 DIAGNOSIS — Z0000 Encounter for general adult medical examination without abnormal findings: Secondary | ICD-10-CM | POA: Diagnosis not present

## 2015-08-05 DIAGNOSIS — I10 Essential (primary) hypertension: Secondary | ICD-10-CM | POA: Diagnosis not present

## 2015-08-05 DIAGNOSIS — Z125 Encounter for screening for malignant neoplasm of prostate: Secondary | ICD-10-CM | POA: Diagnosis not present

## 2015-08-05 DIAGNOSIS — Z09 Encounter for follow-up examination after completed treatment for conditions other than malignant neoplasm: Secondary | ICD-10-CM

## 2015-08-05 DIAGNOSIS — E785 Hyperlipidemia, unspecified: Secondary | ICD-10-CM

## 2015-08-05 DIAGNOSIS — E119 Type 2 diabetes mellitus without complications: Secondary | ICD-10-CM | POA: Diagnosis not present

## 2015-08-05 MED ORDER — TAMSULOSIN HCL 0.4 MG PO CAPS: 0.4000 mg | ORAL_CAPSULE | Freq: Every day | ORAL | Status: DC

## 2015-08-05 NOTE — Patient Instructions (Signed)
GO TO THE LAB :      Get the blood work     GO TO THE FRONT DESK Schedule your next appointment for a  routine checkup in 6 months, no fasting    Try to minimize the use of celebrex  by taking Tylenol 500 mg 2 tablets 3 times a day as needed  Okay to decrease Nexium to every other day and see how that works      [place] cause injuries and can affect all age groups. It is possible to use preventive measures to significantly decrease the likelihood of falls. There are many simple measures which can make your home safer and prevent falls. OUTDOORS  Repair cracks and edges of walkways and driveways.  Remove high doorway thresholds.  Trim shrubbery on the main path into your home.  Have good outside lighting.  Clear walkways of tools, rocks, debris, and clutter.  Check that handrails are not broken and are securely fastened. Both sides of steps should have handrails.  Have leaves, snow, and ice cleared regularly.  Use sand or salt on walkways during winter months.  In the garage, clean up grease or oil spills. BATHROOM  Install night lights.  Install grab bars by the toilet and in the tub and shower.  Use non-skid mats or decals in the tub or shower.  Place a plastic non-slip stool in the shower to sit on, if needed.  Keep floors dry and clean up all water on the floor immediately.  Remove soap buildup in the tub or shower on a regular basis.  Secure bath mats with non-slip, double-sided rug tape.  Remove throw rugs and tripping hazards from the floors. BEDROOMS  Install night lights.  Make sure a bedside light is easy to reach.  Do not use oversized bedding.  Keep a telephone by your bedside.  Have a firm chair with side arms to use for getting dressed.  Remove throw rugs and tripping hazards from the floor. KITCHEN  Keep handles on pots and pans turned toward the center of the stove. Use back burners when possible.  Clean  up spills quickly and allow time for drying.  Avoid walking on wet floors.  Avoid hot utensils and knives.  Position shelves so they are not too high or low.  Place commonly used objects within easy reach.  If necessary, use a sturdy step stool with a grab bar when reaching.  Keep electrical cables out of the way.  Do not use floor polish or wax that makes floors slippery. If you must use wax, use non-skid floor wax.  Remove throw rugs and tripping hazards from the floor. STAIRWAYS  Never leave objects on stairs.  Place handrails on both sides of stairways and use them. Fix any loose handrails. Make sure handrails on both sides of the stairways are as long as the stairs.  Check carpeting to make sure it is firmly attached along stairs. Make repairs to worn or loose carpet promptly.  Avoid placing throw rugs at the top or bottom of stairways, or properly secure the rug with carpet tape to prevent slippage. Get rid of throw rugs, if possible.  Have an electrician put in a light switch at the top and bottom of the stairs. OTHER FALL PREVENTION TIPS  Wear low-heel or rubber-soled shoes that are supportive and fit well. Wear closed toe shoes.  When using a stepladder, make sure it is fully opened and both spreaders are  firmly locked. Do not climb a closed stepladder.  Add color or contrast paint or tape to grab bars and handrails in your home. Place contrasting color strips on first and last steps.  Learn and use mobility aids as needed. Install an electrical emergency response system.  Turn on lights to avoid dark areas. Replace light bulbs that burn out immediately. Get light switches that glow.  Arrange furniture to create clear pathways. Keep furniture in the same place.  Firmly attach carpet with non-skid or double-sided tape.  Eliminate uneven floor surfaces.  Select a carpet pattern that does not visually hide the edge of steps.  Be aware of all pets. OTHER HOME  SAFETY TIPS  Set the water temperature for 120 F (48.8 C).  Keep emergency numbers on or near the telephone.  Keep smoke detectors on every level of the home and near sleeping areas. Document Released: 04/03/2002 Document Revised: 10/13/2011 Document Reviewed: 07/03/2011 [facility] Patient Information 2015 [place], [place]. This information is not intended to replace advice given to you by your health care provider. Make sure you discuss any questions you have with your health care provider.   Preventive Care for Adults Ages 49 and over  Blood pressure check.** / Every 1 to 2 years.  Lipid and cholesterol check.**/ Every 5 years beginning at age 51.  Lung cancer screening. / Every year if you are aged 21-80 years and have a 30-pack-year history of smoking and currently smoke or have quit within the past 15 years. Yearly screening is stopped once you have quit smoking for at least 15 years or develop a health problem that would prevent you from having lung cancer treatment.  Fecal occult blood test (FOBT) of stool. / Every year beginning at age 48 and continuing until age 82. You may not have to do this test if you get a colonoscopy every 10 years.  Flexible sigmoidoscopy** or colonoscopy.** / Every 5 years for a flexible sigmoidoscopy or every 10 years for a colonoscopy beginning at age 35 and continuing until age 40.  Hepatitis C blood test.** / For all people born from [birth year] through 1966 and any individual with known risks for hepatitis C.  Abdominal aortic aneurysm (AAA) screening.** / A one-time screening for ages 39 to 48 years who are current or former smokers.  Skin self-exam. / Monthly.  Influenza vaccine. / Every year.  Tetanus, diphtheria, and acellular pertussis (Tdap/Td) vaccine.** / 1 dose of Td every 10 years.  Varicella vaccine.** / Consult your health care provider.  Zoster vaccine.** / 1 dose for adults aged 41 years or older.  Pneumococcal 13-valent conjugate  (PCV13) vaccine.** / Consult your health care provider.  Pneumococcal polysaccharide (PPSV23) vaccine.** / 1 dose for all adults aged 27 years and older.  Meningococcal vaccine.** / Consult your health care provider.  Hepatitis A vaccine.** / Consult your health care provider.  Hepatitis B vaccine.** / Consult your health care provider.  Haemophilus influenzae type b (Hib) vaccine.** / Consult your health care provider. **Family history and personal history of risk and conditions may change your health care provider's recommendations. Document Released: 06/09/2001 Document Revised: 04/18/2013 Document Reviewed: 09/08/2010 [facility] Patient Information 2015 [place], [place]. This information is not intended to replace advice given to you by your health care provider. Make sure you discuss any questions you have with your health care provider.
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
## 2015-08-05 NOTE — Assessment & Plan Note (Addendum)
Td 2008 ; pneumonia shot 06-09-10; prevnar 10-15; Shingles immunization discussed before  cscope 2016, repeat 2021 Prostate cancer screening: See comments under prostate nodule  diet-exercise doing really well labs: BMP, CBC, FLP, A1c, PSA

## 2015-08-05 NOTE — Assessment & Plan Note (Signed)
DM: Doing great with diet, has lost a significant amount of weight. Continuing benazepril, check A1c HTN: Continue benazepril, check BMP. Depression anxiety:Doing very well with Lexapro 20 mg MSK : on Celebrex, recommend to moderate intake if possible Prostate nodule-LUTS: DRE today is actually normal, he continue with urinary symptoms, trial with Flomax, if not better will call urology Rash, back: Allergic folliculitis from surgery tape, shaving? Recommend OTC hydrocortisone GERD: Minimize use of Nexium to qod RTC 6 months

## 2015-08-05 NOTE — Progress Notes (Signed)
Pre visit review using our clinic review tool, if applicable. No additional management support is needed unless otherwise documented below in the visit note. 

## 2015-08-06 LAB — LIPID PANEL
CHOLESTEROL: 241 mg/dL — AB (ref 0–200)
HDL: 86 mg/dL (ref >39.00)
LDL CALC: 134 mg/dL — AB (ref 0–99)
NonHDL: 155.44
Total CHOL/HDL Ratio: 3
Triglycerides: 105 mg/dL (ref 0.0–149.0)
VLDL: 21 mg/dL (ref 0.0–40.0)

## 2015-08-06 LAB — BASIC METABOLIC PANEL
BUN: 30 mg/dL — ABNORMAL HIGH (ref 6–23)
CALCIUM: 10 mg/dL (ref 8.4–10.5)
CHLORIDE: 105 meq/L (ref 96–112)
CO2: 25 meq/L (ref 19–32)
CREATININE: 0.86 mg/dL (ref 0.40–1.50)
GFR: 93.18 mL/min (ref >60.00)
GLUCOSE: 104 mg/dL — AB (ref 70–99)
Potassium: 4.9 mEq/L (ref 3.5–5.1)
SODIUM: 141 meq/L (ref 135–145)

## 2015-08-06 LAB — HEMOGLOBIN A1C: HEMOGLOBIN A1C: 5.5 % (ref 4.6–6.5)

## 2015-08-06 LAB — ALT: ALT: 20 U/L (ref 0–53)

## 2015-08-06 LAB — AST: AST: 23 U/L (ref 0–37)

## 2015-08-06 LAB — PSA, MEDICARE: PSA: 3.16 ng/ml (ref 0.10–4.00)

## 2015-08-10 ENCOUNTER — Other Ambulatory Visit: Payer: Self-pay | Admitting: Internal Medicine

## 2015-09-06 ENCOUNTER — Other Ambulatory Visit (INDEPENDENT_AMBULATORY_CARE_PROVIDER_SITE_OTHER): Payer: Self-pay | Admitting: Otolaryngology

## 2015-09-06 DIAGNOSIS — H903 Sensorineural hearing loss, bilateral: Secondary | ICD-10-CM | POA: Diagnosis not present

## 2015-09-06 DIAGNOSIS — H9313 Tinnitus, bilateral: Secondary | ICD-10-CM | POA: Diagnosis not present

## 2015-09-06 DIAGNOSIS — H918X9 Other specified hearing loss, unspecified ear: Secondary | ICD-10-CM

## 2015-09-13 DIAGNOSIS — R69 Illness, unspecified: Secondary | ICD-10-CM | POA: Diagnosis not present

## 2015-09-16 ENCOUNTER — Ambulatory Visit
Admission: RE | Admit: 2015-09-16 | Discharge: 2015-09-16 | Disposition: A | Discharge status: Home / Self Care | Payer: Medicare HMO | Source: Ambulatory Visit | Attending: Otolaryngology | Admitting: Otolaryngology

## 2015-09-16 DIAGNOSIS — H9192 Unspecified hearing loss, left ear: Secondary | ICD-10-CM | POA: Diagnosis not present

## 2015-09-16 DIAGNOSIS — H918X9 Other specified hearing loss, unspecified ear: Secondary | ICD-10-CM

## 2015-09-16 MED ORDER — GADOBENATE DIMEGLUMINE 529 MG/ML IV SOLN
18.0000 mL | Freq: Once | INTRAVENOUS | Status: AC | PRN
  Administered 2015-09-16: 18 mL via INTRAVENOUS

## 2015-09-17 DIAGNOSIS — J00 Acute nasopharyngitis [common cold]: Secondary | ICD-10-CM | POA: Diagnosis not present

## 2015-09-27 DIAGNOSIS — Z79899 Other long term (current) drug therapy: Secondary | ICD-10-CM | POA: Diagnosis not present

## 2015-09-27 DIAGNOSIS — S0101XA Laceration without foreign body of scalp, initial encounter: Secondary | ICD-10-CM | POA: Diagnosis not present

## 2015-09-27 DIAGNOSIS — W01198A Fall on same level from slipping, tripping and stumbling with subsequent striking against other object, initial encounter: Secondary | ICD-10-CM | POA: Diagnosis not present

## 2015-09-27 DIAGNOSIS — Z96649 Presence of unspecified artificial hip joint: Secondary | ICD-10-CM | POA: Diagnosis not present

## 2015-09-27 DIAGNOSIS — S0990XA Unspecified injury of head, initial encounter: Secondary | ICD-10-CM | POA: Diagnosis not present

## 2015-09-27 DIAGNOSIS — Z9889 Other specified postprocedural states: Secondary | ICD-10-CM | POA: Diagnosis not present

## 2015-09-27 DIAGNOSIS — Z96659 Presence of unspecified artificial knee joint: Secondary | ICD-10-CM | POA: Diagnosis not present

## 2015-10-04 DIAGNOSIS — Z4802 Encounter for removal of sutures: Secondary | ICD-10-CM | POA: Diagnosis not present

## 2015-10-04 DIAGNOSIS — L209 Atopic dermatitis, unspecified: Secondary | ICD-10-CM | POA: Diagnosis not present

## 2015-11-07 ENCOUNTER — Other Ambulatory Visit: Payer: Self-pay

## 2015-11-07 MED ORDER — ESCITALOPRAM OXALATE 20 MG PO TABS: 20.0000 mg | ORAL_TABLET | Freq: Every day | ORAL | Status: DC

## 2015-12-23 DIAGNOSIS — J069 Acute upper respiratory infection, unspecified: Secondary | ICD-10-CM | POA: Diagnosis not present

## 2016-01-01 ENCOUNTER — Telehealth: Payer: Medicare HMO | Admitting: Nurse Practitioner

## 2016-01-01 DIAGNOSIS — R05 Cough: Secondary | ICD-10-CM

## 2016-01-01 DIAGNOSIS — R059 Cough, unspecified: Secondary | ICD-10-CM

## 2016-01-01 MED ORDER — AZITHROMYCIN 250 MG PO TABS: ORAL_TABLET | ORAL | 0 refills | Status: DC

## 2016-01-01 NOTE — Progress Notes (Signed)

## 2016-01-05 ENCOUNTER — Encounter: Payer: Self-pay | Admitting: Internal Medicine

## 2016-01-06 ENCOUNTER — Encounter: Payer: Self-pay | Admitting: Internal Medicine

## 2016-01-06 ENCOUNTER — Ambulatory Visit (HOSPITAL_BASED_OUTPATIENT_CLINIC_OR_DEPARTMENT_OTHER)
Admission: RE | Admit: 2016-01-06 | Discharge: 2016-01-06 | Disposition: A | Discharge status: Home / Self Care | Payer: Medicare HMO | Source: Ambulatory Visit | Attending: Internal Medicine | Admitting: Internal Medicine

## 2016-01-06 ENCOUNTER — Other Ambulatory Visit: Payer: Self-pay | Admitting: Internal Medicine

## 2016-01-06 ENCOUNTER — Ambulatory Visit (INDEPENDENT_AMBULATORY_CARE_PROVIDER_SITE_OTHER): Payer: Medicare HMO | Admitting: Internal Medicine

## 2016-01-06 VITALS — BP 118/76 | HR 67 | Temp 98.0°F | Resp 16 | Ht 72.0 in | Wt 219.2 lb

## 2016-01-06 DIAGNOSIS — I10 Essential (primary) hypertension: Secondary | ICD-10-CM | POA: Diagnosis not present

## 2016-01-06 DIAGNOSIS — R05 Cough: Secondary | ICD-10-CM | POA: Diagnosis not present

## 2016-01-06 DIAGNOSIS — R059 Cough, unspecified: Secondary | ICD-10-CM

## 2016-01-06 MED ORDER — LOSARTAN POTASSIUM 100 MG PO TABS: 100.0000 mg | ORAL_TABLET | Freq: Every day | ORAL | 1 refills | Status: DC

## 2016-01-06 MED ORDER — HYDROCOD POLST-CPM POLST ER 10-8 MG/5ML PO SUER: 5.0000 mL | Freq: Two times a day (BID) | ORAL | 0 refills | Status: DC | PRN

## 2016-01-06 NOTE — Patient Instructions (Addendum)
Get your chest x-ray downstairs  For cough: Mucinex DM twice a day as needed  If the cough continue, take Tussionex, watch for excessive somnolence  Okay to use the inhaler as needed  If you're not improving in the next 2 weeks please let me know  Stop benazepril, start losartan  Next visit in October as a schedule

## 2016-01-06 NOTE — Telephone Encounter (Signed)
Appt scheduled at 2:15 PM.

## 2016-01-06 NOTE — Progress Notes (Signed)
Pre visit review using our clinic review tool, if applicable. No additional management support is needed unless otherwise documented below in the visit note. 
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
## 2016-01-06 NOTE — Telephone Encounter (Signed)
Called pt to schedule an appt. LVM for pt advising pt to call our office to schedule.

## 2016-01-07 ENCOUNTER — Telehealth: Payer: Self-pay

## 2016-01-07 MED ORDER — HYDROCODONE-HOMATROPINE 5-1.5 MG/5ML PO SYRP: 5.0000 mL | ORAL_SOLUTION | Freq: Three times a day (TID) | ORAL | 0 refills | Status: DC | PRN

## 2016-01-07 NOTE — Assessment & Plan Note (Signed)
Cough: 2 weeks history of cough, S/P prednisone, Mucinex, a chest x-ray (was neg) and a Z-Pak. Cough is triggered by deep breathing, lying down and exercise. EKG today sinus bradycardia with no acute changes Plan: Repeat CXR to r/o Pneumonia, conservative treatment with Mucinex,tussionex;  the cough is quite irritative consequently will d/c ACEi, and start losartan Addendum: Tussionex requires a PA, will try Hycodan and let the patient known HTN: See above, switching benazepril to losartan, recommend to check BPs closely in the next few days. RTC 4 weeks

## 2016-01-07 NOTE — Telephone Encounter (Signed)
PA request for Tussionex. Rx changed to Hycodan syrup. Rx placed at front desk for pick up and [facility] informed of change.

## 2016-01-14 DIAGNOSIS — M5137 Other intervertebral disc degeneration, lumbosacral region: Secondary | ICD-10-CM | POA: Diagnosis not present

## 2016-01-14 DIAGNOSIS — M542 Cervicalgia: Secondary | ICD-10-CM | POA: Diagnosis not present

## 2016-02-04 ENCOUNTER — Ambulatory Visit (INDEPENDENT_AMBULATORY_CARE_PROVIDER_SITE_OTHER): Payer: Medicare HMO | Admitting: Internal Medicine

## 2016-02-04 ENCOUNTER — Encounter: Payer: Self-pay | Admitting: Internal Medicine

## 2016-02-04 ENCOUNTER — Ambulatory Visit (HOSPITAL_BASED_OUTPATIENT_CLINIC_OR_DEPARTMENT_OTHER)
Admission: RE | Admit: 2016-02-04 | Discharge: 2016-02-04 | Disposition: A | Discharge status: Home / Self Care | Payer: Medicare HMO | Source: Ambulatory Visit | Attending: Internal Medicine | Admitting: Internal Medicine

## 2016-02-04 VITALS — BP 126/72 | HR 69 | Temp 97.8°F | Resp 16 | Ht 72.0 in | Wt 225.4 lb

## 2016-02-04 DIAGNOSIS — R5383 Other fatigue: Secondary | ICD-10-CM | POA: Diagnosis not present

## 2016-02-04 DIAGNOSIS — F418 Other specified anxiety disorders: Secondary | ICD-10-CM | POA: Diagnosis not present

## 2016-02-04 DIAGNOSIS — M19041 Primary osteoarthritis, right hand: Secondary | ICD-10-CM | POA: Insufficient documentation

## 2016-02-04 DIAGNOSIS — R69 Illness, unspecified: Secondary | ICD-10-CM | POA: Diagnosis not present

## 2016-02-04 DIAGNOSIS — F419 Anxiety disorder, unspecified: Secondary | ICD-10-CM

## 2016-02-04 DIAGNOSIS — F329 Major depressive disorder, single episode, unspecified: Secondary | ICD-10-CM

## 2016-02-04 DIAGNOSIS — E119 Type 2 diabetes mellitus without complications: Secondary | ICD-10-CM

## 2016-02-04 DIAGNOSIS — F32A Depression, unspecified: Secondary | ICD-10-CM

## 2016-02-04 DIAGNOSIS — M7989 Other specified soft tissue disorders: Secondary | ICD-10-CM

## 2016-02-04 DIAGNOSIS — Z23 Encounter for immunization: Secondary | ICD-10-CM

## 2016-02-04 LAB — BASIC METABOLIC PANEL
BUN: 30 mg/dL — ABNORMAL HIGH (ref 6–23)
CHLORIDE: 105 meq/L (ref 96–112)
CO2: 25 mEq/L (ref 19–32)
Calcium: 9.7 mg/dL (ref 8.4–10.5)
Creatinine, Ser: 0.82 mg/dL (ref 0.40–1.50)
GFR: 98.31 mL/min (ref >60.00)
Glucose, Bld: 108 mg/dL — ABNORMAL HIGH (ref 70–99)
POTASSIUM: 4.5 meq/L (ref 3.5–5.1)
SODIUM: 138 meq/L (ref 135–145)

## 2016-02-04 LAB — CBC WITH DIFFERENTIAL/PLATELET
BASOS PCT: 0.5 % (ref 0.0–3.0)
Basophils Absolute: 0 10*3/uL (ref 0.0–0.1)
EOS PCT: 4.6 % (ref 0.0–5.0)
Eosinophils Absolute: 0.3 10*3/uL (ref 0.0–0.7)
HEMATOCRIT: 45.7 % (ref 39.0–52.0)
HEMOGLOBIN: 15.4 g/dL (ref 13.0–17.0)
Lymphocytes Relative: 32 % (ref 12.0–46.0)
Lymphs Abs: 2.3 10*3/uL (ref 0.7–4.0)
MCHC: 33.8 g/dL (ref 30.0–36.0)
MCV: 90.1 fl (ref 78.0–100.0)
MONOS PCT: 12 % (ref 3.0–12.0)
Monocytes Absolute: 0.9 10*3/uL (ref 0.1–1.0)
Neutro Abs: 3.6 10*3/uL (ref 1.4–7.7)
Neutrophils Relative %: 50.9 % (ref 43.0–77.0)
Platelets: 272 10*3/uL (ref 150.0–400.0)
RBC: 5.07 Mil/uL (ref 4.22–5.81)
RDW: 13.9 % (ref 11.5–15.5)
WBC: 7.2 10*3/uL (ref 4.0–10.5)

## 2016-02-04 LAB — VITAMIN B12: Vitamin B-12: 623 pg/mL (ref 211–911)

## 2016-02-04 LAB — FOLATE: FOLATE: 15.9 ng/mL (ref >5.9)

## 2016-02-04 LAB — HEMOGLOBIN A1C: Hgb A1c MFr Bld: 6.1 % (ref 4.6–6.5)

## 2016-02-04 LAB — TSH: TSH: 2.43 u[IU]/mL (ref 0.35–4.50)

## 2016-02-04 MED ORDER — BUPROPION HCL 75 MG PO TABS: 75.0000 mg | ORAL_TABLET | Freq: Two times a day (BID) | ORAL | 3 refills | Status: DC

## 2016-02-04 NOTE — Patient Instructions (Addendum)
GO TO THE LAB : Get the blood work     GO TO THE FRONT DESK Schedule your next appointment for a  checkup in 4 weeks   STOP BY THE FIRST FLOOR:  get the XR   Add wellbutrin 75 mg: 1 tab a day x 10 days, then 1 tab twice a day

## 2016-02-04 NOTE — Assessment & Plan Note (Signed)
DM: Diet control, check A1c HTN: s/p switch ACEi to losartan, cough resolved, cough may have been due to ACEi. Check a BMP Fatigue: sx are atypical, unable to finish tasks due to lack of interest but also DOE.  DOE is atypical (SOB when taking a shower but able to walk 1 or 2 blocks). Has multiple CV RF. Chest x-ray and EKG last month unremarkable. Get a stress test. Also CBC, TSH, 123456, folic acid and vitamin D. Treat depression. Depression: Controlled?  Continue Lexapro, add Wellbutrin. Patient in agreement Hand swelling: Strongly denies any injury, he does have what seem to be  bony deformities. Get a x-ray. LUTS: To Flomax for a while, self DC, not an  issue at this point RTC 4 weeks
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
## 2016-02-04 NOTE — Progress Notes (Signed)
Pre visit review using our clinic review tool, if applicable. No additional management support is needed unless otherwise documented below in the visit note. 

## 2016-02-06 LAB — VITAMIN D 1,25 DIHYDROXY
VITAMIN D 1, 25 (OH) TOTAL: 42 pg/mL (ref 18–72)
VITAMIN D3 1, 25 (OH): 42 pg/mL
Vitamin D2 1, 25 (OH)2: <8 pg/mL

## 2016-02-26 DIAGNOSIS — R69 Illness, unspecified: Secondary | ICD-10-CM | POA: Diagnosis not present

## 2016-02-26 DIAGNOSIS — I10 Essential (primary) hypertension: Secondary | ICD-10-CM | POA: Diagnosis not present

## 2016-02-26 DIAGNOSIS — K219 Gastro-esophageal reflux disease without esophagitis: Secondary | ICD-10-CM | POA: Diagnosis not present

## 2016-02-26 DIAGNOSIS — Z6831 Body mass index (BMI) 31.0-31.9, adult: Secondary | ICD-10-CM | POA: Diagnosis not present

## 2016-02-26 DIAGNOSIS — Z0000 Encounter for general adult medical examination without abnormal findings: Secondary | ICD-10-CM | POA: Diagnosis not present

## 2016-03-02 ENCOUNTER — Other Ambulatory Visit: Payer: Self-pay | Admitting: Internal Medicine

## 2016-03-03 ENCOUNTER — Ambulatory Visit: Payer: Medicare HMO | Admitting: Internal Medicine

## 2016-03-10 DIAGNOSIS — H903 Sensorineural hearing loss, bilateral: Secondary | ICD-10-CM | POA: Diagnosis not present

## 2016-03-13 ENCOUNTER — Encounter: Payer: Self-pay | Admitting: Internal Medicine

## 2016-03-13 ENCOUNTER — Ambulatory Visit (INDEPENDENT_AMBULATORY_CARE_PROVIDER_SITE_OTHER): Payer: Medicare HMO | Admitting: Internal Medicine

## 2016-03-13 VITALS — BP 124/68 | HR 70 | Temp 97.9°F | Resp 14 | Ht 72.0 in | Wt 226.5 lb

## 2016-03-13 DIAGNOSIS — F32A Depression, unspecified: Secondary | ICD-10-CM

## 2016-03-13 DIAGNOSIS — M7989 Other specified soft tissue disorders: Secondary | ICD-10-CM | POA: Diagnosis not present

## 2016-03-13 DIAGNOSIS — F419 Anxiety disorder, unspecified: Principal | ICD-10-CM

## 2016-03-13 DIAGNOSIS — R5383 Other fatigue: Secondary | ICD-10-CM

## 2016-03-13 DIAGNOSIS — F418 Other specified anxiety disorders: Secondary | ICD-10-CM

## 2016-03-13 DIAGNOSIS — F329 Major depressive disorder, single episode, unspecified: Secondary | ICD-10-CM

## 2016-03-13 MED ORDER — BUPROPION HCL 100 MG PO TABS: 100.0000 mg | ORAL_TABLET | Freq: Two times a day (BID) | ORAL | 5 refills | Status: DC

## 2016-03-13 NOTE — Progress Notes (Signed)
Pre visit review using our clinic review tool, if applicable. No additional management support is needed unless otherwise documented below in the visit note. 

## 2016-03-13 NOTE — Patient Instructions (Addendum)
Continue the same medications but will increase Wellbutrin to 100 mg one tablet twice a day  Please consider see one of our counselors.  We are referring you to a rheumatologist regards the hand  Next visit with me in 2 or 3 months. Please make an appointment.
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
## 2016-03-15 NOTE — Assessment & Plan Note (Signed)
Fatigue : labs were okay, sx unchanged. Did not pursue stress test and likes to hold off on that thus I recommend observation Depression: On Lexapro, we added Wellbutrin, no improvement. On further questioning, I think his main problem is feeling lonely. He is counseled today to the best of my ability, recommend formal counseling, information provided. Increase Wellbutrin to 100 MG bid  Hand swelling: see last OV, continue with a puffy knuckle. X-ray negative. Etiology unclear, refer to rheumatology.  RTC 2-3 months RTC 4 weeks

## 2016-03-26 DIAGNOSIS — R69 Illness, unspecified: Secondary | ICD-10-CM | POA: Diagnosis not present

## 2016-03-30 ENCOUNTER — Encounter (INDEPENDENT_AMBULATORY_CARE_PROVIDER_SITE_OTHER): Payer: Self-pay | Admitting: Orthopedic Surgery

## 2016-03-30 ENCOUNTER — Ambulatory Visit (INDEPENDENT_AMBULATORY_CARE_PROVIDER_SITE_OTHER): Payer: Medicare HMO | Admitting: Orthopedic Surgery

## 2016-03-30 DIAGNOSIS — M19041 Primary osteoarthritis, right hand: Secondary | ICD-10-CM
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
## 2016-04-02 DIAGNOSIS — J3089 Other allergic rhinitis: Secondary | ICD-10-CM | POA: Diagnosis not present

## 2016-05-11 ENCOUNTER — Other Ambulatory Visit: Payer: Self-pay | Admitting: Internal Medicine

## 2016-06-15 ENCOUNTER — Encounter: Payer: Self-pay | Admitting: Internal Medicine

## 2016-06-15 ENCOUNTER — Telehealth: Payer: Self-pay | Admitting: Internal Medicine

## 2016-06-15 ENCOUNTER — Ambulatory Visit (INDEPENDENT_AMBULATORY_CARE_PROVIDER_SITE_OTHER): Payer: Medicare HMO | Admitting: Internal Medicine

## 2016-06-15 VITALS — BP 124/70 | HR 60 | Temp 97.8°F | Resp 14 | Ht 72.0 in | Wt 220.1 lb

## 2016-06-15 DIAGNOSIS — R69 Illness, unspecified: Secondary | ICD-10-CM | POA: Diagnosis not present

## 2016-06-15 DIAGNOSIS — F329 Major depressive disorder, single episode, unspecified: Secondary | ICD-10-CM

## 2016-06-15 DIAGNOSIS — F419 Anxiety disorder, unspecified: Secondary | ICD-10-CM

## 2016-06-15 DIAGNOSIS — F32A Depression, unspecified: Secondary | ICD-10-CM

## 2016-06-15 DIAGNOSIS — R5383 Other fatigue: Secondary | ICD-10-CM

## 2016-06-15 DIAGNOSIS — F418 Other specified anxiety disorders: Secondary | ICD-10-CM

## 2016-06-15 NOTE — Assessment & Plan Note (Signed)
Fatigue: See previous visit, improved Depression: see last OV. Currently on Lexapro and Wellbutrin 100 mg twice a day. Not suicidal ideas, although he is not much better he is currently satisfied with the treatment and doesn't like to change. Hand swelling: Saw orthopedic surgery, DX with DJD. No further problems Hemoptysis? Mucus pooled on the throat in the morning with specks of blood x 2 days. No fever chills, most recent chest x-ray few months ago normal. Recommend Flonase for 3 weeks, call if no better or if symptoms resurface. Chronic medical problems seem stable. RTC CPX Medicare wellness in 3-4 months

## 2016-06-15 NOTE — Telephone Encounter (Signed)
Patient scheduled AWV for 10/10/16 at 8am with Hoyle Sauer and physical with PCP at [facility].
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
## 2016-06-15 NOTE — Progress Notes (Signed)
Pre visit review using our clinic review tool, if applicable. No additional management support is needed unless otherwise documented below in the visit note. 

## 2016-06-15 NOTE — Patient Instructions (Signed)
  GO TO THE FRONT DESK Schedule your next appointment for a  Physical exam and a Medicare Wellness  In 3-4 months   Use flonase, 2 sprays on each side of the nose every night x 3 weeks If you see more red mucus-- please call the office

## 2016-08-23 ENCOUNTER — Encounter: Payer: Self-pay | Admitting: Internal Medicine

## 2016-08-24 MED ORDER — ESOMEPRAZOLE MAGNESIUM 40 MG PO CPDR: 40.0000 mg | DELAYED_RELEASE_CAPSULE | Freq: Every day | ORAL | 0 refills | Status: DC

## 2016-08-24 MED ORDER — LOSARTAN POTASSIUM 100 MG PO TABS: 100.0000 mg | ORAL_TABLET | Freq: Every day | ORAL | 0 refills | Status: DC

## 2016-08-24 MED ORDER — CELECOXIB 200 MG PO CAPS: 200.0000 mg | ORAL_CAPSULE | Freq: Every day | ORAL | 0 refills | Status: DC

## 2016-08-24 MED ORDER — ESCITALOPRAM OXALATE 20 MG PO TABS: 20.0000 mg | ORAL_TABLET | Freq: Every day | ORAL | 0 refills | Status: DC

## 2016-09-08 ENCOUNTER — Encounter: Payer: Self-pay | Admitting: Internal Medicine

## 2016-09-08 ENCOUNTER — Other Ambulatory Visit: Payer: Self-pay | Admitting: Internal Medicine

## 2016-09-08 MED ORDER — BUPROPION HCL 100 MG PO TABS: 100.0000 mg | ORAL_TABLET | Freq: Two times a day (BID) | ORAL | 1 refills | Status: DC

## 2016-09-10 NOTE — Telephone Encounter (Signed)
Please advise.//AB/CMA 

## 2016-09-11 ENCOUNTER — Other Ambulatory Visit: Payer: Self-pay | Admitting: Internal Medicine

## 2016-09-11 MED ORDER — BUPROPION HCL ER (SR) 100 MG PO TB12: 100.0000 mg | ORAL_TABLET | Freq: Two times a day (BID) | ORAL | 1 refills | Status: DC

## 2016-09-25 ENCOUNTER — Other Ambulatory Visit: Payer: Self-pay | Admitting: Internal Medicine

## 2016-10-02 DIAGNOSIS — R69 Illness, unspecified: Secondary | ICD-10-CM | POA: Diagnosis not present
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
## 2016-10-20 ENCOUNTER — Encounter: Payer: Self-pay | Admitting: Internal Medicine

## 2016-10-20 ENCOUNTER — Ambulatory Visit (INDEPENDENT_AMBULATORY_CARE_PROVIDER_SITE_OTHER): Payer: Medicare HMO | Admitting: Internal Medicine

## 2016-10-20 VITALS — BP 132/68 | HR 67 | Temp 98.2°F | Resp 16 | Ht 72.0 in | Wt 228.2 lb

## 2016-10-20 DIAGNOSIS — E119 Type 2 diabetes mellitus without complications: Secondary | ICD-10-CM

## 2016-10-20 DIAGNOSIS — Z0000 Encounter for general adult medical examination without abnormal findings: Secondary | ICD-10-CM

## 2016-10-20 DIAGNOSIS — Z23 Encounter for immunization: Secondary | ICD-10-CM | POA: Diagnosis not present

## 2016-10-20 LAB — HEMOGLOBIN A1C: Hgb A1c MFr Bld: 6.4 % (ref 4.6–6.5)

## 2016-10-20 LAB — LIPID PANEL
CHOLESTEROL: 189 mg/dL (ref 0–200)
HDL: 71.1 mg/dL (ref >39.00)
LDL CALC: 101 mg/dL — AB (ref 0–99)
NonHDL: 117.67
Total CHOL/HDL Ratio: 3
Triglycerides: 83 mg/dL (ref 0.0–149.0)
VLDL: 16.6 mg/dL (ref 0.0–40.0)

## 2016-10-20 LAB — COMPREHENSIVE METABOLIC PANEL
ALBUMIN: 4.4 g/dL (ref 3.5–5.2)
ALT: 50 U/L (ref 0–53)
AST: 31 U/L (ref 0–37)
Alkaline Phosphatase: 39 U/L (ref 39–117)
BILIRUBIN TOTAL: 0.6 mg/dL (ref 0.2–1.2)
BUN: 30 mg/dL — AB (ref 6–23)
CALCIUM: 10.2 mg/dL (ref 8.4–10.5)
CHLORIDE: 105 meq/L (ref 96–112)
CO2: 24 mEq/L (ref 19–32)
CREATININE: 0.88 mg/dL (ref 0.40–1.50)
GFR: 90.43 mL/min (ref >60.00)
Glucose, Bld: 119 mg/dL — ABNORMAL HIGH (ref 70–99)
Potassium: 4.4 mEq/L (ref 3.5–5.1)
SODIUM: 137 meq/L (ref 135–145)
Total Protein: 7.2 g/dL (ref 6.0–8.3)

## 2016-10-20 LAB — TSH: TSH: 1.49 u[IU]/mL (ref 0.35–4.50)

## 2016-10-20 LAB — CBC WITH DIFFERENTIAL/PLATELET
BASOS ABS: 0 10*3/uL (ref 0.0–0.1)
Basophils Relative: 0.5 % (ref 0.0–3.0)
Eosinophils Absolute: 0.1 10*3/uL (ref 0.0–0.7)
Eosinophils Relative: 1.6 % (ref 0.0–5.0)
HEMATOCRIT: 41.9 % (ref 39.0–52.0)
HEMOGLOBIN: 14.3 g/dL (ref 13.0–17.0)
LYMPHS PCT: 13 % (ref 12.0–46.0)
Lymphs Abs: 1.2 10*3/uL (ref 0.7–4.0)
MCHC: 34.2 g/dL (ref 30.0–36.0)
MCV: 92 fl (ref 78.0–100.0)
MONOS PCT: 7.2 % (ref 3.0–12.0)
Monocytes Absolute: 0.7 10*3/uL (ref 0.1–1.0)
Neutro Abs: 7.2 10*3/uL (ref 1.4–7.7)
Neutrophils Relative %: 77.7 % — ABNORMAL HIGH (ref 43.0–77.0)
Platelets: 228 10*3/uL (ref 150.0–400.0)
RBC: 4.55 Mil/uL (ref 4.22–5.81)
RDW: 14.1 % (ref 11.5–15.5)
WBC: 9.3 10*3/uL (ref 4.0–10.5)

## 2016-10-20 MED ORDER — PREDNISONE 10 MG PO TABS: ORAL_TABLET | ORAL | 0 refills | Status: DC
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
## 2016-10-20 NOTE — Assessment & Plan Note (Addendum)
--  Td 2008 ; pneumonia shot 06-09-10; prevnar 10-15; Shingles  Discussed, rec to wait until shingrex is available , although if he is offered one at the pharmacy, ok to proceed  --CCS: cscope 2016, repeat 2021 --Prostate cancer screening: DRE wnl and PSA at baseline on 07-2015 --Lung cancer screening: benefits discussed, declined . --diet, exercise doing really well --labs:  CMP, FLP, CBC, A1c, TSH --Start aspirin 81 daily
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
## 2016-10-20 NOTE — Patient Instructions (Addendum)
GO TO THE LAB : Get the blood work     GO TO THE FRONT DESK Schedule your next appointment for a  follow-up in 6 months   Take prednisone as prescribed for back pain. If not better please let me know.   William Norton , Thank you for taking time to come for your Medicare Wellness Visit. I appreciate your ongoing commitment to your health goals. Please review the following plan we discussed and let me know if I can assist you in the future.   These are the goals we discussed: Goals      Patient Stated   . Drink 5 glasses of water per day. (pt-stated)    . exercise 3x/ week (pt-stated)       This is a list of the screening recommended for you and due dates:  Health Maintenance  Topic Date Due  . Complete foot exam   08/15/1954  . Eye exam for diabetics  08[birth year]15/1955  . Hemoglobin A1C  08/04/2016  . Flu Shot  11/25/2016  . Colon Cancer Screening  09/14/2019  . Tetanus Vaccine  10/21/2026  .  Hepatitis C: One time screening is recommended by Center for Disease Control  (CDC) for  adults born from [birth year] through 1965.   Completed  . Pneumonia vaccines  Completed

## 2016-10-20 NOTE — Progress Notes (Signed)
Pre visit review using our clinic review tool, if applicable. No additional management support is needed unless otherwise documented below in the visit note. 

## 2016-10-21 NOTE — Assessment & Plan Note (Signed)
DM: Diet control, check A1c HTN: Continue losartan Depression anxiety: Currently well controlled DJD, spinal stenosis: Developed symptoms consistent with L sciatic pain 3 weeks ago , vascular exam normal. Recommend a round of prednisone (last A1c 6.1), continue Celebrex. Call if no better  Hemoptysis?: See last visit, no further symptoms. Unlikely to be true hemoptysis. GERD-Barrett's esophagus: Asx as long as he takes PPIs RTC 6 months

## 2016-11-04 DIAGNOSIS — D509 Iron deficiency anemia, unspecified: Secondary | ICD-10-CM | POA: Diagnosis not present

## 2016-11-04 DIAGNOSIS — Z974 Presence of external hearing-aid: Secondary | ICD-10-CM | POA: Diagnosis not present

## 2016-11-04 DIAGNOSIS — M542 Cervicalgia: Secondary | ICD-10-CM | POA: Diagnosis not present

## 2016-11-04 DIAGNOSIS — E663 Overweight: Secondary | ICD-10-CM | POA: Diagnosis not present

## 2016-11-04 DIAGNOSIS — H9192 Unspecified hearing loss, left ear: Secondary | ICD-10-CM | POA: Diagnosis not present

## 2016-11-04 DIAGNOSIS — K219 Gastro-esophageal reflux disease without esophagitis: Secondary | ICD-10-CM | POA: Diagnosis not present

## 2016-11-04 DIAGNOSIS — R69 Illness, unspecified: Secondary | ICD-10-CM | POA: Diagnosis not present

## 2016-11-04 DIAGNOSIS — I10 Essential (primary) hypertension: Secondary | ICD-10-CM | POA: Diagnosis not present

## 2016-11-04 DIAGNOSIS — Z0000 Encounter for general adult medical examination without abnormal findings: Secondary | ICD-10-CM | POA: Diagnosis not present

## 2016-11-04 DIAGNOSIS — M130 Polyarthritis, unspecified: Secondary | ICD-10-CM | POA: Diagnosis not present

## 2016-11-25 ENCOUNTER — Encounter: Payer: Self-pay | Admitting: Internal Medicine

## 2016-11-26 MED ORDER — BUPROPION HCL ER (SR) 100 MG PO TB12: 100.0000 mg | ORAL_TABLET | Freq: Two times a day (BID) | ORAL | 1 refills | Status: DC

## 2016-11-26 NOTE — Telephone Encounter (Signed)
Please call patient and clarify what type of bupropion he needs exactly.

## 2016-11-26 NOTE — Telephone Encounter (Signed)
Spoke w/ Pt, informed him that we had received message but needed clarification. He was wanting bupropion HCL sent to [facility]- informed that the SR and HCL must be the same but differ under name just at the manufacturer level (couldn't find in EPIC order). Pt stated he feels that he feels less motivated recently, recommended he come in to discuss w/ PCP, Pt declined to schedule appt at this time and agreed to have Wellbutrin SR 100mg  refilled to [facility] for now.

## 2016-11-30 ENCOUNTER — Other Ambulatory Visit: Payer: Self-pay | Admitting: Internal Medicine

## 2016-11-30 MED ORDER — ESOMEPRAZOLE MAGNESIUM 40 MG PO CPDR: 40.0000 mg | DELAYED_RELEASE_CAPSULE | Freq: Every day | ORAL | 1 refills | Status: DC

## 2016-12-15 ENCOUNTER — Other Ambulatory Visit: Payer: Self-pay | Admitting: Internal Medicine

## 2016-12-15 MED ORDER — ESCITALOPRAM OXALATE 20 MG PO TABS: 20.0000 mg | ORAL_TABLET | Freq: Every day | ORAL | 1 refills | Status: DC

## 2017-01-13 ENCOUNTER — Other Ambulatory Visit: Payer: Self-pay | Admitting: Internal Medicine

## 2017-01-14 ENCOUNTER — Encounter: Payer: Self-pay | Admitting: Internal Medicine

## 2017-01-14 MED ORDER — CELECOXIB 200 MG PO CAPS: 200.0000 mg | ORAL_CAPSULE | Freq: Every day | ORAL | 0 refills | Status: DC

## 2017-01-22 ENCOUNTER — Ambulatory Visit (INDEPENDENT_AMBULATORY_CARE_PROVIDER_SITE_OTHER): Payer: Medicare HMO | Admitting: Internal Medicine

## 2017-01-22 ENCOUNTER — Encounter: Payer: Self-pay | Admitting: Internal Medicine

## 2017-01-22 VITALS — BP 132/80 | HR 82 | Temp 98.2°F | Resp 14 | Ht 72.0 in | Wt 234.0 lb

## 2017-01-22 DIAGNOSIS — Z23 Encounter for immunization: Secondary | ICD-10-CM

## 2017-01-22 DIAGNOSIS — E119 Type 2 diabetes mellitus without complications: Secondary | ICD-10-CM | POA: Diagnosis not present

## 2017-01-22 DIAGNOSIS — M722 Plantar fascial fibromatosis: Secondary | ICD-10-CM | POA: Diagnosis not present

## 2017-01-22 DIAGNOSIS — M79671 Pain in right foot: Secondary | ICD-10-CM
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
## 2017-01-22 NOTE — Progress Notes (Signed)
Pre visit review using our clinic review tool, if applicable. No additional management support is needed unless otherwise documented below in the visit note. 

## 2017-01-22 NOTE — Patient Instructions (Signed)
Please do the stretching 3 times a day  ICE  your heel at night  Call if not gradually better

## 2017-01-23 NOTE — Assessment & Plan Note (Signed)
Planta fasciitis: Stretching and icing discuss. He also got orthotics, recommend to use them but start gradually. Knows to call me if not better DM: Feet exam negative today. Right shoulder injury? Exam is benign, patient asx. No further eval needed at this time Primary care: Flu shot today RTC December as a schedule

## 2017-03-02 ENCOUNTER — Other Ambulatory Visit: Payer: Self-pay | Admitting: Internal Medicine

## 2017-03-02 MED ORDER — LOSARTAN POTASSIUM 100 MG PO TABS: 100.0000 mg | ORAL_TABLET | Freq: Every day | ORAL | 1 refills | Status: DC

## 2017-03-12 ENCOUNTER — Telehealth: Payer: Self-pay | Admitting: Internal Medicine

## 2017-03-12 ENCOUNTER — Encounter: Payer: Self-pay | Admitting: Internal Medicine

## 2017-03-12 NOTE — Telephone Encounter (Signed)
Appt scheduled for 03/15/2017 at 11. Informed Pt I was sending MyChart message w/ suicide helpline numbers. Pt verbalized understanding.

## 2017-03-12 NOTE — Telephone Encounter (Signed)
See patient's message, I called him, for the last 2 weeks is feeling sad, down and not motivated.  He thinks is depression. He denies physical symptoms such as chest pain, palpitations but admits to be very fatigued. Please call the patient and set up an appointment for next week, Monday or Tuesday. Also, if he has severe symptoms or suicidal ideas recommend to call the  24-Hour HELPLINE [phone] or [phone]

## 2017-03-15 ENCOUNTER — Encounter: Payer: Self-pay | Admitting: Internal Medicine

## 2017-03-15 ENCOUNTER — Ambulatory Visit: Payer: Medicare HMO | Admitting: Internal Medicine

## 2017-03-15 VITALS — BP 132/80 | HR 75 | Temp 98.0°F | Resp 14 | Ht 72.0 in | Wt 235.0 lb

## 2017-03-15 DIAGNOSIS — F419 Anxiety disorder, unspecified: Secondary | ICD-10-CM | POA: Diagnosis not present

## 2017-03-15 DIAGNOSIS — F32A Depression, unspecified: Secondary | ICD-10-CM

## 2017-03-15 DIAGNOSIS — R69 Illness, unspecified: Secondary | ICD-10-CM | POA: Diagnosis not present

## 2017-03-15 DIAGNOSIS — F329 Major depressive disorder, single episode, unspecified: Secondary | ICD-10-CM

## 2017-03-15 NOTE — Progress Notes (Signed)
Pre visit review using our clinic review tool, if applicable. No additional management support is needed unless otherwise documented below in the visit note. 
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
## 2017-03-15 NOTE — Patient Instructions (Signed)
Continue the same medications  Please call one of the psychiatrists in town and get an appointment.  If the symptoms get much worse, please call us any time.
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
## 2017-03-29 ENCOUNTER — Telehealth: Payer: Self-pay | Admitting: Internal Medicine

## 2017-03-29 NOTE — Telephone Encounter (Signed)
Patient sent me a message but did not see my answer. Please check on him, is he  doing okay?  He has an appointment in a couple of weeks, does he need a sooner appointment?  Let me know

## 2017-03-30 NOTE — Telephone Encounter (Signed)
LMOM instructing to call back to let us know how he is doing since adjusting depression/anxiety medications.

## 2017-04-05 ENCOUNTER — Other Ambulatory Visit: Payer: Self-pay | Admitting: Internal Medicine

## 2017-04-05 ENCOUNTER — Encounter: Payer: Self-pay | Admitting: Internal Medicine

## 2017-04-06 ENCOUNTER — Ambulatory Visit: Payer: Medicare HMO | Admitting: Cardiology

## 2017-04-07 ENCOUNTER — Other Ambulatory Visit: Payer: Self-pay | Admitting: Cardiology

## 2017-04-07 ENCOUNTER — Encounter: Payer: Self-pay | Admitting: Cardiology

## 2017-04-07 ENCOUNTER — Ambulatory Visit (INDEPENDENT_AMBULATORY_CARE_PROVIDER_SITE_OTHER): Payer: Medicare HMO | Admitting: Cardiology

## 2017-04-07 VITALS — BP 150/116 | HR 82 | Ht 72.0 in | Wt 237.8 lb

## 2017-04-07 DIAGNOSIS — E669 Obesity, unspecified: Secondary | ICD-10-CM | POA: Diagnosis not present

## 2017-04-07 DIAGNOSIS — R0600 Dyspnea, unspecified: Secondary | ICD-10-CM | POA: Diagnosis not present

## 2017-04-07 DIAGNOSIS — Z87891 Personal history of nicotine dependence: Secondary | ICD-10-CM

## 2017-04-07 DIAGNOSIS — R55 Syncope and collapse: Secondary | ICD-10-CM

## 2017-04-07 MED ORDER — ESCITALOPRAM OXALATE 20 MG PO TABS: 20.0000 mg | ORAL_TABLET | Freq: Every day | ORAL | 1 refills | Status: DC

## 2017-04-07 MED ORDER — METOPROLOL TARTRATE 50 MG PO TABS: 50.0000 mg | ORAL_TABLET | Freq: Once | ORAL | 0 refills | Status: DC

## 2017-04-07 MED ORDER — CELECOXIB 200 MG PO CAPS: 200.0000 mg | ORAL_CAPSULE | Freq: Every day | ORAL | 1 refills | Status: DC

## 2017-04-07 MED ORDER — ESOMEPRAZOLE MAGNESIUM 40 MG PO CPDR: 40.0000 mg | DELAYED_RELEASE_CAPSULE | Freq: Every day | ORAL | 1 refills | Status: DC
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
## 2017-04-07 NOTE — Patient Instructions (Addendum)
Medication Instructions:  The current medical regimen is effective;  continue present plan and medications.  Labwork: You will need to have blood work before your coronary CT.  (BMP)  Testing/Procedures: Your physician has requested that you have an echocardiogram. Echocardiography is a painless test that uses sound waves to create images of your heart. It provides your doctor with information about the size and shape of your heart and how well your heart's chambers and valves are working. This procedure takes approximately one hour. There are no restrictions for this procedure.  Your physician has requested that you have cardiac CT. Cardiac computed tomography (CT) is a painless test that uses an x-ray machine to take clear, detailed pictures of your heart. For further information please visit [email or web address]. Please follow instruction sheet as given.  Follow-Up: Further follow up will be based on the results of the above testing.  Thank you for choosing [facility]!!    Please arrive at the [facility] main entrance of [facility] at  TBA AM (30-45 minutes prior to test start time)  [facility] [address] [place], [place] [address] [phone]  Proceed to the [facility] Radiology Department (First Floor).  Please follow these instructions carefully (unless otherwise directed):  Hold all erectile dysfunction medications at least 48 hours prior to test.  On the Night Before the Test: . Drink plenty of water. . Do not consume any caffeinated/decaffeinated beverages or chocolate 12 hours prior to your test. . Do not take any antihistamines 12 hours prior to your test.  On the Day of the Test: . Drink plenty of water. Do not drink any water within one hour of the test. . Do not eat any food 4 hours prior to the test. . You may take your regular medications prior to the test. . IF NOT ON A BETA BLOCKER - Take 50 mg of lopressor (metoprolol)  one hour before the test. . HOLD Furosemide morning of the test.  After the Test: . Drink plenty of water. . After receiving IV contrast, you may experience a mild flushed feeling. This is normal. . On occasion, you may experience a mild rash up to 24 hours after the test. This is not dangerous. If this occurs, you can take Benadryl 25 mg and increase your fluid intake. . If you experience trouble breathing, this can be serious. If it is severe call 911 IMMEDIATELY. If it is mild, please call our office. . If you take any of these medications: Glipizide/Metformin, Avandament, Glucavance, please do not take 48 hours after completing test.

## 2017-04-09 DIAGNOSIS — R69 Illness, unspecified: Secondary | ICD-10-CM | POA: Diagnosis not present

## 2017-04-12 ENCOUNTER — Encounter: Payer: Self-pay | Admitting: Internal Medicine

## 2017-04-12 ENCOUNTER — Telehealth: Payer: Self-pay | Admitting: Cardiology

## 2017-04-12 ENCOUNTER — Ambulatory Visit (INDEPENDENT_AMBULATORY_CARE_PROVIDER_SITE_OTHER): Payer: Medicare HMO | Admitting: Internal Medicine

## 2017-04-12 VITALS — BP 124/78 | HR 78 | Temp 97.8°F | Resp 16 | Ht 72.0 in | Wt 235.4 lb

## 2017-04-12 DIAGNOSIS — F419 Anxiety disorder, unspecified: Secondary | ICD-10-CM | POA: Diagnosis not present

## 2017-04-12 DIAGNOSIS — F32A Depression, unspecified: Secondary | ICD-10-CM

## 2017-04-12 DIAGNOSIS — I10 Essential (primary) hypertension: Secondary | ICD-10-CM | POA: Diagnosis not present

## 2017-04-12 DIAGNOSIS — R69 Illness, unspecified: Secondary | ICD-10-CM | POA: Diagnosis not present

## 2017-04-12 DIAGNOSIS — E119 Type 2 diabetes mellitus without complications: Secondary | ICD-10-CM | POA: Diagnosis not present

## 2017-04-12 DIAGNOSIS — F329 Major depressive disorder, single episode, unspecified: Secondary | ICD-10-CM

## 2017-04-12 LAB — BASIC METABOLIC PANEL
BUN: 27 mg/dL — AB (ref 6–23)
CHLORIDE: 105 meq/L (ref 96–112)
CO2: 21 mEq/L (ref 19–32)
CREATININE: 0.98 mg/dL (ref 0.40–1.50)
Calcium: 9.5 mg/dL (ref 8.4–10.5)
GFR: 79.76 mL/min (ref >60.00)
GLUCOSE: 109 mg/dL — AB (ref 70–99)
POTASSIUM: 4.5 meq/L (ref 3.5–5.1)
Sodium: 135 mEq/L (ref 135–145)

## 2017-04-12 LAB — HEMOGLOBIN A1C: HEMOGLOBIN A1C: 7 % — AB (ref 4.6–6.5)

## 2017-04-12 NOTE — Patient Instructions (Addendum)
  GO TO THE LAB : Get the blood work     GO TO THE FRONT DESK Schedule your next appointment for a physical exam in 6 months, fasting  Please go to  [facility] , schedule an appointment  [place], [place], [place] [address]  Phone: [phone]

## 2017-04-12 NOTE — Telephone Encounter (Signed)
Spoke with wife who is reporting pt doesn't want to do anything differently about his s/s at this point.  He is scheduled to have blood work and echo and will follow through with them.  Advised pt's wt is actually down 2 lbs since he was since recently by Dr Marlou Porch.   No new edema etc.  They will continue to monitor s/s and call back if further s/s.

## 2017-04-12 NOTE — Telephone Encounter (Signed)
Pt seen today for follow up.

## 2017-04-12 NOTE — Telephone Encounter (Signed)
New message  Patient wife calling with concerns about heavy breathing, and some SOB.  Patient did see PCP today.   They have plans to travel for the holidays. Wife wants to confirm it it safe for patient to travel. Please call  Pt c/o Shortness Of Breath: STAT if SOB developed within the last 24 hours or pt is noticeably SOB on the phone   1. Are you currently SOB (can you hear that pt is SOB on the phone)? Patient wife calling  2. How long have you been experiencing SOB? 2 days  3. Are you SOB when sitting or when up moving around? Siting and  moving  4. Are you currently experiencing any other symptoms? Heavy breathing, tired, sob
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
## 2017-04-12 NOTE — Progress Notes (Signed)
Pre visit review using our clinic review tool, if applicable. No additional management support is needed unless otherwise documented below in the visit note. 

## 2017-04-13 ENCOUNTER — Other Ambulatory Visit: Payer: Medicare HMO | Admitting: *Deleted

## 2017-04-13 DIAGNOSIS — R55 Syncope and collapse: Secondary | ICD-10-CM

## 2017-04-13 DIAGNOSIS — R0600 Dyspnea, unspecified: Secondary | ICD-10-CM | POA: Diagnosis not present

## 2017-04-13 LAB — BASIC METABOLIC PANEL
BUN/Creatinine Ratio: 24 (ref 10–24)
BUN: 22 mg/dL (ref 8–27)
CALCIUM: 9.7 mg/dL (ref 8.6–10.2)
CO2: 21 mmol/L (ref 20–29)
CREATININE: 0.92 mg/dL (ref 0.76–1.27)
Chloride: 101 mmol/L (ref 96–106)
GFR calc Af Amer: 96 mL/min/{1.73_m2} (ref >59)
GFR, EST NON AFRICAN AMERICAN: 83 mL/min/{1.73_m2} (ref >59)
Glucose: 111 mg/dL — ABNORMAL HIGH (ref 65–99)
POTASSIUM: 4.9 mmol/L (ref 3.5–5.2)
Sodium: 137 mmol/L (ref 134–144)

## 2017-04-13 NOTE — Assessment & Plan Note (Signed)
Depression: See last OV, feeling about the same, denies suicidal ideas, has been unable to get a appointment to see psychiatry, encouraged to keep trying.  See AVS Syncopal episode: Already saw cardiology, no further events, further workup is planned. DM: Diet controlled, check A1c HTN: Currently on losartan, check a BMP. RTC 6 months CPX.

## 2017-04-16 ENCOUNTER — Telehealth: Payer: Self-pay | Admitting: Internal Medicine

## 2017-04-16 NOTE — Telephone Encounter (Signed)
Rx for HYCODAN dated 01/07/2016 was not picked up. Document was shredded

## 2017-04-26 ENCOUNTER — Ambulatory Visit (HOSPITAL_COMMUNITY): Payer: Medicare HMO | Attending: Cardiovascular Disease

## 2017-04-26 ENCOUNTER — Other Ambulatory Visit: Payer: Self-pay

## 2017-04-26 DIAGNOSIS — R55 Syncope and collapse: Secondary | ICD-10-CM | POA: Insufficient documentation

## 2017-04-26 DIAGNOSIS — I10 Essential (primary) hypertension: Secondary | ICD-10-CM | POA: Diagnosis not present

## 2017-04-26 DIAGNOSIS — R0600 Dyspnea, unspecified: Secondary | ICD-10-CM

## 2017-04-26 DIAGNOSIS — I071 Rheumatic tricuspid insufficiency: Secondary | ICD-10-CM | POA: Diagnosis not present

## 2017-05-19 ENCOUNTER — Ambulatory Visit (HOSPITAL_COMMUNITY)
Admission: RE | Admit: 2017-05-19 | Discharge: 2017-05-19 | Disposition: A | Discharge status: Home / Self Care | Payer: Medicare HMO | Source: Ambulatory Visit | Attending: Cardiology | Admitting: Cardiology

## 2017-05-19 ENCOUNTER — Encounter (HOSPITAL_COMMUNITY): Payer: Self-pay

## 2017-05-19 DIAGNOSIS — R55 Syncope and collapse: Secondary | ICD-10-CM | POA: Insufficient documentation

## 2017-05-19 DIAGNOSIS — R0600 Dyspnea, unspecified: Secondary | ICD-10-CM

## 2017-05-19 DIAGNOSIS — R079 Chest pain, unspecified: Secondary | ICD-10-CM

## 2017-05-19 DIAGNOSIS — I2584 Coronary atherosclerosis due to calcified coronary lesion: Secondary | ICD-10-CM | POA: Diagnosis not present

## 2017-05-19 MED ORDER — IOPAMIDOL (ISOVUE-370) INJECTION 76%
INTRAVENOUS | Status: AC
  Filled 2017-05-19: qty 100

## 2017-05-19 MED ORDER — NITROGLYCERIN 0.4 MG SL SUBL
SUBLINGUAL_TABLET | SUBLINGUAL | Status: AC
  Filled 2017-05-19: qty 2

## 2017-05-19 MED ORDER — IOPAMIDOL (ISOVUE-370) INJECTION 76%
100.0000 mL | Freq: Once | INTRAVENOUS | Status: AC | PRN
  Administered 2017-05-19: 80 mL via INTRAVENOUS

## 2017-05-21 ENCOUNTER — Telehealth: Payer: Self-pay | Admitting: Cardiology

## 2017-05-21 NOTE — Telephone Encounter (Signed)
New message     Patient calling for scan results. Please call

## 2017-05-21 NOTE — Telephone Encounter (Signed)
Reviewed results of coronary CT with patient.  Scheduled appt for 1/28 with Dr Marlou Porch to review results in full detail and to discuss possible cardiac cath.

## 2017-05-24 ENCOUNTER — Ambulatory Visit: Payer: Medicare HMO | Admitting: Cardiology

## 2017-05-24 ENCOUNTER — Encounter: Payer: Self-pay | Admitting: Cardiology

## 2017-05-24 VITALS — BP 130/82 | HR 101 | Ht 71.0 in | Wt 237.0 lb

## 2017-05-24 DIAGNOSIS — I10 Essential (primary) hypertension: Secondary | ICD-10-CM

## 2017-05-24 DIAGNOSIS — R931 Abnormal findings on diagnostic imaging of heart and coronary circulation: Secondary | ICD-10-CM

## 2017-05-24 DIAGNOSIS — I2584 Coronary atherosclerosis due to calcified coronary lesion: Secondary | ICD-10-CM

## 2017-05-24 DIAGNOSIS — I2510 Atherosclerotic heart disease of native coronary artery without angina pectoris: Secondary | ICD-10-CM

## 2017-05-24 DIAGNOSIS — Z01810 Encounter for preprocedural cardiovascular examination: Secondary | ICD-10-CM

## 2017-05-24 DIAGNOSIS — E785 Hyperlipidemia, unspecified: Secondary | ICD-10-CM

## 2017-05-24 MED ORDER — ATORVASTATIN CALCIUM 40 MG PO TABS: 40.0000 mg | ORAL_TABLET | Freq: Every day | ORAL | 3 refills | Status: DC

## 2017-05-24 MED ORDER — ASPIRIN EC 81 MG PO TBEC: 81.0000 mg | DELAYED_RELEASE_TABLET | Freq: Every day | ORAL | 3 refills | Status: AC

## 2017-05-24 NOTE — Patient Instructions (Addendum)
Medication Instructions:  Please start Asprin 81 mg a day and Atorvastatin 40 mg a day. Continue all other medications as listed.  Labwork: Please have blood work today (CBC, CBC and PT/INR) and then again in 3 months (lipid and ALT)  Testing/Procedures: None  Follow-Up: Follow up in 1 month with Dr Marlou Porch  If you need a refill on your cardiac medications before your next appointment, please call your pharmacy.  Thank you for choosing [facility]!!      [facility] OFFICE [address], [place] [address] [place] [address] Dept: [phone] Loc: [place]  05/24/2017  You are scheduled for a Cardiac Cath on Thursday May 27, 2017 with Dr. Ellyn Hack.  1. Please arrive at the [facility] (Main Entrance A) at [facility] [facility]: [address] [place], [place] [address] at 5:30 am (two hours before your procedure to ensure your preparation). Free valet parking service is available.   Special note: Every effort is made to have your procedure done on time. Please understand that emergencies sometimes delay scheduled procedures.  2. Diet: Nothing after midnight  3. Labs: please have today  4. Medication instructions in preparation for your procedure:  On the morning of your procedure, take your Asprin 81 mg and any morning medicines NOT listed above.  You may use sips of water.  5. Plan for one night stay--bring personal belongings. 6. Bring a current list of your medications and current insurance cards. 7. You MUST have a responsible person to drive you home. 8. Someone MUST be with you the first 24 hours after you arrive home or your discharge will be delayed. 9. Please wear clothes that are easy to get on and off and wear slip-on shoes.  Thank you for allowing [place] to care for you!   -- [facility]
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
## 2017-05-25 ENCOUNTER — Telehealth: Payer: Self-pay | Admitting: *Deleted

## 2017-05-25 LAB — PROTIME-INR
INR: 1 (ref 0.8–1.2)
PROTHROMBIN TIME: 10.7 s (ref 9.1–12.0)

## 2017-05-25 LAB — BASIC METABOLIC PANEL
BUN/Creatinine Ratio: 19 (ref 10–24)
BUN: 18 mg/dL (ref 8–27)
CHLORIDE: 99 mmol/L (ref 96–106)
CO2: 23 mmol/L (ref 20–29)
Calcium: 9.5 mg/dL (ref 8.6–10.2)
Creatinine, Ser: 0.96 mg/dL (ref 0.76–1.27)
GFR calc non Af Amer: 79 mL/min/{1.73_m2} (ref >59)
GFR, EST AFRICAN AMERICAN: 91 mL/min/{1.73_m2} (ref >59)
Glucose: 171 mg/dL — ABNORMAL HIGH (ref 65–99)
POTASSIUM: 4.6 mmol/L (ref 3.5–5.2)
Sodium: 138 mmol/L (ref 134–144)

## 2017-05-25 LAB — CBC
HEMATOCRIT: 42.2 % (ref 37.5–51.0)
HEMOGLOBIN: 14.5 g/dL (ref 13.0–17.7)
MCH: 30.3 pg (ref 26.6–33.0)
MCHC: 34.4 g/dL (ref 31.5–35.7)
MCV: 88 fL (ref 79–97)
Platelets: 271 10*3/uL (ref 150–379)
RBC: 4.79 x10E6/uL (ref 4.14–5.80)
RDW: 14.6 % (ref 12.3–15.4)
WBC: 6.6 10*3/uL (ref 3.4–10.8)

## 2017-05-25 NOTE — Telephone Encounter (Signed)
Patient contacted pre-catheterization at [facility] scheduled for: 05/27/17 7:30 AM Verified arrival time and place: [facility] NT/Main A 5:30 AM Nothing to eat or drink after midnight. Confirmed NKA  Confirmed AM meds to be taken pre-cath with sip of water: ASA 81 mg am of  Confirmed patient has responsible person to drive home post procedure and observe patient for 24 hours: yes Addl concerns:

## 2017-05-26 DIAGNOSIS — R55 Syncope and collapse: Secondary | ICD-10-CM

## 2017-05-26 DIAGNOSIS — R9389 Abnormal findings on diagnostic imaging of other specified body structures: Secondary | ICD-10-CM

## 2017-05-26 DIAGNOSIS — R0609 Other forms of dyspnea: Secondary | ICD-10-CM

## 2017-05-26 DIAGNOSIS — R0600 Dyspnea, unspecified: Secondary | ICD-10-CM | POA: Diagnosis present

## 2017-05-27 ENCOUNTER — Ambulatory Visit (HOSPITAL_COMMUNITY)
Admission: RE | Admit: 2017-05-27 | Discharge: 2017-05-27 | Disposition: A | Discharge status: Home / Self Care | Payer: Medicare HMO | Source: Ambulatory Visit | Attending: Cardiology | Admitting: Cardiology

## 2017-05-27 ENCOUNTER — Encounter (HOSPITAL_COMMUNITY)
Admission: RE | Disposition: A | Discharge status: Home / Self Care | Payer: Self-pay | Source: Ambulatory Visit | Attending: Cardiology

## 2017-05-27 ENCOUNTER — Other Ambulatory Visit: Payer: Self-pay | Admitting: Cardiology

## 2017-05-27 ENCOUNTER — Encounter (HOSPITAL_COMMUNITY): Payer: Self-pay | Admitting: Cardiology

## 2017-05-27 DIAGNOSIS — Z7982 Long term (current) use of aspirin: Secondary | ICD-10-CM | POA: Diagnosis not present

## 2017-05-27 DIAGNOSIS — Z96651 Presence of right artificial knee joint: Secondary | ICD-10-CM | POA: Diagnosis not present

## 2017-05-27 DIAGNOSIS — R0609 Other forms of dyspnea: Secondary | ICD-10-CM | POA: Diagnosis not present

## 2017-05-27 DIAGNOSIS — R9389 Abnormal findings on diagnostic imaging of other specified body structures: Secondary | ICD-10-CM

## 2017-05-27 DIAGNOSIS — Z87828 Personal history of other (healed) physical injury and trauma: Secondary | ICD-10-CM | POA: Diagnosis not present

## 2017-05-27 DIAGNOSIS — Z01810 Encounter for preprocedural cardiovascular examination: Secondary | ICD-10-CM

## 2017-05-27 DIAGNOSIS — E785 Hyperlipidemia, unspecified: Secondary | ICD-10-CM | POA: Insufficient documentation

## 2017-05-27 DIAGNOSIS — E669 Obesity, unspecified: Secondary | ICD-10-CM | POA: Insufficient documentation

## 2017-05-27 DIAGNOSIS — Z79899 Other long term (current) drug therapy: Secondary | ICD-10-CM | POA: Diagnosis not present

## 2017-05-27 DIAGNOSIS — I2584 Coronary atherosclerosis due to calcified coronary lesion: Secondary | ICD-10-CM | POA: Insufficient documentation

## 2017-05-27 DIAGNOSIS — I10 Essential (primary) hypertension: Secondary | ICD-10-CM | POA: Insufficient documentation

## 2017-05-27 DIAGNOSIS — Z96641 Presence of right artificial hip joint: Secondary | ICD-10-CM | POA: Diagnosis not present

## 2017-05-27 DIAGNOSIS — R55 Syncope and collapse: Secondary | ICD-10-CM | POA: Diagnosis not present

## 2017-05-27 DIAGNOSIS — Z6832 Body mass index (BMI) 32.0-32.9, adult: Secondary | ICD-10-CM | POA: Diagnosis not present

## 2017-05-27 DIAGNOSIS — Z8249 Family history of ischemic heart disease and other diseases of the circulatory system: Secondary | ICD-10-CM | POA: Insufficient documentation

## 2017-05-27 DIAGNOSIS — I2510 Atherosclerotic heart disease of native coronary artery without angina pectoris: Secondary | ICD-10-CM | POA: Diagnosis not present

## 2017-05-27 DIAGNOSIS — Z7902 Long term (current) use of antithrombotics/antiplatelets: Secondary | ICD-10-CM | POA: Diagnosis not present

## 2017-05-27 DIAGNOSIS — Z955 Presence of coronary angioplasty implant and graft: Secondary | ICD-10-CM

## 2017-05-27 DIAGNOSIS — R0600 Dyspnea, unspecified: Secondary | ICD-10-CM | POA: Diagnosis present

## 2017-05-27 HISTORY — DX: Atherosclerotic heart disease of native coronary artery without angina pectoris: I25.10

## 2017-05-27 HISTORY — PX: CORONARY STENT INTERVENTION: CATH118234

## 2017-05-27 HISTORY — PX: LEFT HEART CATH AND CORONARY ANGIOGRAPHY: CATH118249

## 2017-05-27 HISTORY — PX: INTRAVASCULAR PRESSURE WIRE/FFR STUDY: CATH118243

## 2017-05-27 LAB — POCT ACTIVATED CLOTTING TIME: Activated Clotting Time: 301 seconds

## 2017-05-27 SURGERY — LEFT HEART CATH AND CORONARY ANGIOGRAPHY
Anesthesia: LOCAL

## 2017-05-27 MED ORDER — LOSARTAN POTASSIUM 50 MG PO TABS: 100.0000 mg | ORAL_TABLET | Freq: Every day | ORAL | Status: DC

## 2017-05-27 MED ORDER — HEPARIN (PORCINE) IN NACL 2-0.9 UNIT/ML-% IJ SOLN
INTRAMUSCULAR | Status: AC
  Filled 2017-05-27: qty 1000

## 2017-05-27 MED ORDER — IOPAMIDOL (ISOVUE-370) INJECTION 76%
INTRAVENOUS | Status: AC
  Filled 2017-05-27: qty 100

## 2017-05-27 MED ORDER — FAMOTIDINE IN NACL 20-0.9 MG/50ML-% IV SOLN
INTRAVENOUS | Status: AC
  Filled 2017-05-27: qty 50

## 2017-05-27 MED ORDER — HYDRALAZINE HCL 20 MG/ML IJ SOLN
5.0000 mg | INTRAMUSCULAR | Status: AC | PRN
  Administered 2017-05-27: 5 mg via INTRAVENOUS

## 2017-05-27 MED ORDER — HEPARIN SODIUM (PORCINE) 1000 UNIT/ML IJ SOLN
INTRAMUSCULAR | Status: DC | PRN
  Administered 2017-05-27: 5500 [IU] via INTRAVENOUS
  Administered 2017-05-27: 4500 [IU] via INTRAVENOUS

## 2017-05-27 MED ORDER — SODIUM CHLORIDE 0.9 % IV SOLN: 250.0000 mL | INTRAVENOUS | Status: DC | PRN

## 2017-05-27 MED ORDER — FENTANYL CITRATE (PF) 100 MCG/2ML IJ SOLN
INTRAMUSCULAR | Status: DC | PRN
  Administered 2017-05-27: 25 ug via INTRAVENOUS

## 2017-05-27 MED ORDER — NITROGLYCERIN 1 MG/10 ML FOR IR/CATH LAB
INTRA_ARTERIAL | Status: AC
  Filled 2017-05-27: qty 10

## 2017-05-27 MED ORDER — SODIUM CHLORIDE 0.9% FLUSH: 3.0000 mL | Freq: Two times a day (BID) | INTRAVENOUS | Status: DC

## 2017-05-27 MED ORDER — METOPROLOL SUCCINATE ER 25 MG PO TB24: 25.0000 mg | ORAL_TABLET | Freq: Every day | ORAL | 1 refills | Status: DC

## 2017-05-27 MED ORDER — ASPIRIN EC 81 MG PO TBEC: 81.0000 mg | DELAYED_RELEASE_TABLET | Freq: Every day | ORAL | Status: DC

## 2017-05-27 MED ORDER — ONE-DAILY MULTI VITAMINS PO TABS: 1.0000 | ORAL_TABLET | Freq: Every day | ORAL | Status: DC

## 2017-05-27 MED ORDER — LIDOCAINE HCL (PF) 1 % IJ SOLN
INTRAMUSCULAR | Status: AC
  Filled 2017-05-27: qty 30

## 2017-05-27 MED ORDER — CLOPIDOGREL BISULFATE 300 MG PO TABS
ORAL_TABLET | ORAL | Status: DC | PRN
  Administered 2017-05-27: 600 mg via ORAL

## 2017-05-27 MED ORDER — HEPARIN (PORCINE) IN NACL 2-0.9 UNIT/ML-% IJ SOLN
INTRAMUSCULAR | Status: DC | PRN
  Administered 2017-05-27: 10 mL via INTRA_ARTERIAL

## 2017-05-27 MED ORDER — IOPAMIDOL (ISOVUE-370) INJECTION 76%
INTRAVENOUS | Status: AC
  Filled 2017-05-27: qty 50

## 2017-05-27 MED ORDER — ATORVASTATIN CALCIUM 40 MG PO TABS: 40.0000 mg | ORAL_TABLET | Freq: Every day | ORAL | Status: DC

## 2017-05-27 MED ORDER — MIDAZOLAM HCL 2 MG/2ML IJ SOLN
INTRAMUSCULAR | Status: AC
  Filled 2017-05-27: qty 2

## 2017-05-27 MED ORDER — NITROGLYCERIN 1 MG/10 ML FOR IR/CATH LAB
INTRA_ARTERIAL | Status: DC | PRN
  Administered 2017-05-27: 200 ug via INTRACORONARY

## 2017-05-27 MED ORDER — CLOPIDOGREL BISULFATE 75 MG PO TABS: 75.0000 mg | ORAL_TABLET | Freq: Every day | ORAL | 0 refills | Status: DC

## 2017-05-27 MED ORDER — VERAPAMIL HCL 2.5 MG/ML IV SOLN
INTRAVENOUS | Status: AC
  Filled 2017-05-27: qty 2

## 2017-05-27 MED ORDER — FAMOTIDINE IN NACL 20-0.9 MG/50ML-% IV SOLN
INTRAVENOUS | Status: DC | PRN
  Administered 2017-05-27: 20 mg via INTRAVENOUS

## 2017-05-27 MED ORDER — PANTOPRAZOLE SODIUM 40 MG PO TBEC: 40.0000 mg | DELAYED_RELEASE_TABLET | Freq: Every day | ORAL | Status: DC

## 2017-05-27 MED ORDER — MORPHINE SULFATE (PF) 10 MG/ML IV SOLN: 2.0000 mg | INTRAVENOUS | Status: DC | PRN

## 2017-05-27 MED ORDER — SODIUM CHLORIDE 0.9% FLUSH: 3.0000 mL | INTRAVENOUS | Status: DC | PRN

## 2017-05-27 MED ORDER — FENTANYL CITRATE (PF) 100 MCG/2ML IJ SOLN
INTRAMUSCULAR | Status: AC
  Filled 2017-05-27: qty 2

## 2017-05-27 MED ORDER — NITROGLYCERIN 0.4 MG SL SUBL: 0.4000 mg | SUBLINGUAL_TABLET | SUBLINGUAL | 2 refills | Status: DC | PRN

## 2017-05-27 MED ORDER — LIDOCAINE HCL (PF) 1 % IJ SOLN
INTRAMUSCULAR | Status: DC | PRN
  Administered 2017-05-27: 3 mL

## 2017-05-27 MED ORDER — SODIUM CHLORIDE 0.9 % WEIGHT BASED INFUSION
3.0000 mL/kg/h | INTRAVENOUS | Status: DC
  Administered 2017-05-27: 3 mL/kg/h via INTRAVENOUS

## 2017-05-27 MED ORDER — SODIUM CHLORIDE 0.9 % IV SOLN
INTRAVENOUS | Status: AC
  Administered 2017-05-27: 14:00:00 via INTRAVENOUS

## 2017-05-27 MED ORDER — HEPARIN SODIUM (PORCINE) 1000 UNIT/ML IJ SOLN
INTRAMUSCULAR | Status: AC
  Filled 2017-05-27: qty 1

## 2017-05-27 MED ORDER — ADENOSINE 12 MG/4ML IV SOLN
INTRAVENOUS | Status: AC
  Filled 2017-05-27: qty 16

## 2017-05-27 MED ORDER — ESCITALOPRAM OXALATE 20 MG PO TABS: 20.0000 mg | ORAL_TABLET | Freq: Every day | ORAL | Status: DC

## 2017-05-27 MED ORDER — SODIUM CHLORIDE 0.9 % WEIGHT BASED INFUSION: 1.0000 mL/kg/h | INTRAVENOUS | Status: DC

## 2017-05-27 MED ORDER — PANTOPRAZOLE SODIUM 40 MG PO TBEC: 40.0000 mg | DELAYED_RELEASE_TABLET | Freq: Every day | ORAL | 1 refills | Status: DC

## 2017-05-27 MED ORDER — BUPROPION HCL ER (SR) 100 MG PO TB12: 100.0000 mg | ORAL_TABLET | Freq: Two times a day (BID) | ORAL | Status: DC

## 2017-05-27 MED ORDER — HEPARIN (PORCINE) IN NACL 2-0.9 UNIT/ML-% IJ SOLN
INTRAMUSCULAR | Status: DC | PRN
  Administered 2017-05-27: 1000 mL

## 2017-05-27 MED ORDER — LABETALOL HCL 5 MG/ML IV SOLN: 10.0000 mg | INTRAVENOUS | Status: AC | PRN

## 2017-05-27 MED ORDER — ACETAMINOPHEN 325 MG PO TABS: 650.0000 mg | ORAL_TABLET | ORAL | Status: DC | PRN

## 2017-05-27 MED ORDER — ASPIRIN 81 MG PO CHEW: 81.0000 mg | CHEWABLE_TABLET | ORAL | Status: DC

## 2017-05-27 MED ORDER — CLOPIDOGREL BISULFATE 75 MG PO TABS: 75.0000 mg | ORAL_TABLET | Freq: Every day | ORAL | Status: DC

## 2017-05-27 MED ORDER — ANGIOPLASTY BOOK
Freq: Once | Status: DC
  Filled 2017-05-27: qty 1

## 2017-05-27 MED ORDER — MIDAZOLAM HCL 2 MG/2ML IJ SOLN
INTRAMUSCULAR | Status: DC | PRN
  Administered 2017-05-27: 2 mg via INTRAVENOUS

## 2017-05-27 MED ORDER — CLOPIDOGREL BISULFATE 300 MG PO TABS
ORAL_TABLET | ORAL | Status: AC
  Filled 2017-05-27: qty 2

## 2017-05-27 MED ORDER — IOPAMIDOL (ISOVUE-370) INJECTION 76%
INTRAVENOUS | Status: DC | PRN
  Administered 2017-05-27: 180 mL via INTRA_ARTERIAL

## 2017-05-27 MED ORDER — LIDOCAINE HCL 1 % IJ SOLN
INTRAMUSCULAR | Status: AC
  Filled 2017-05-27: qty 40

## 2017-05-27 MED ORDER — HYDRALAZINE HCL 20 MG/ML IJ SOLN
INTRAMUSCULAR | Status: AC
  Administered 2017-05-27: 5 mg via INTRAVENOUS
  Filled 2017-05-27: qty 1

## 2017-05-27 MED ORDER — ONDANSETRON HCL 4 MG/2ML IJ SOLN: 4.0000 mg | Freq: Four times a day (QID) | INTRAMUSCULAR | Status: DC | PRN

## 2017-05-27 MED FILL — CLOPIDOGREL 75 MG TABLET: 75 | 30 days supply | Qty: 30 | Fill #0

## 2017-05-27 SURGICAL SUPPLY — 19 items
BALLN SAPPHIRE 2.5X15 (BALLOONS) ×2
BALLN SAPPHIRE ~~LOC~~ 3.0X18 (BALLOONS) ×1 IMPLANT
BALLOON SAPPHIRE 2.5X15 (BALLOONS) IMPLANT
CATH INFINITI 5 FR JL3.5 (CATHETERS) ×1 IMPLANT
CATH OPTITORQUE SARAH 4.5 5F (CATHETERS) IMPLANT
CATH OPTITORQUE TIG 4.0 5F (CATHETERS) ×1 IMPLANT
CATH VISTA GUIDE 6FR XBLAD3.5 (CATHETERS) ×1 IMPLANT
DEVICE RAD COMP TR BAND LRG (VASCULAR PRODUCTS) ×1 IMPLANT
GLIDESHEATH SLEND A-KIT 6F 22G (SHEATH) ×1 IMPLANT
GUIDEWIRE INQWIRE 1.5J.035X260 (WIRE) IMPLANT
GUIDEWIRE PRESSURE COMET II (WIRE) ×1 IMPLANT
INQWIRE 1.5J .035X260CM (WIRE) ×2
KIT ENCORE 26 ADVANTAGE (KITS) ×1 IMPLANT
KIT ESSENTIALS PG (KITS) ×1 IMPLANT
KIT HEART LEFT (KITS) ×2 IMPLANT
PACK CARDIAC CATHETERIZATION (CUSTOM PROCEDURE TRAY) ×2 IMPLANT
STENT SIERRA 2.75 X 28 MM (Permanent Stent) ×1 IMPLANT
TRANSDUCER W/STOPCOCK (MISCELLANEOUS) ×2 IMPLANT
TUBING CIL FLEX 10 FLL-RA (TUBING) ×2 IMPLANT

## 2017-05-27 NOTE — Discharge Summary (Signed)
Discharge Summary/SAME DAY PCI    Patient ID: William Norton,  MRN: [id number], DOB/AGE: August 09, 1945 72 y.o.  Admit date: 05/27/2017 Discharge date: 05/27/2017  Primary Care Provider: [facility] Primary Cardiologist: Marlou Porch  Discharge Diagnoses    Principal Problem:   Abnormal findings on diagnostic imaging of cardiovascular system -  Cor CTA - LAD & Cx calcifications - unable to perform CT FFR Active Problems:   Exertional dyspnea   Near syncope  Allergies No Known Allergies  Diagnostic Studies/Procedures    Cath: 05/27/17  Conclusion     Culprit Lesion: Prox LAD to Mid LAD lesion is 75% stenosed. Highly positive FFR (0.56)  A drug-eluting stent was successfully placed using a STENT SIERRA 2.75 X 28 MM.  Post intervention, there is a 0% residual stenosis.  Ost LAD lesion is 30% stenosed. Mid LAD lesion is 40% stenosed beyond culprit lesion  Prox RCA lesion is 50% stenosed. Small, non-dominant vessel.  LV end diastolic pressure is mildly elevated.   Severe p-mLAD disease (FFR 0.56) -- PCI with 1 Xience DES Otherwise mild calcified disease in CAD & Cx. Left Dominant system.  Plan: Return to short stay for TR band removal.  Anticipate same day discharge if stable. Dual antiplatelet therapy for 1 year Continue aggressive risk factor modification per Dr. Marlou Porch.  Glenetta Hew, M.D., M.S.  _____________   History of Present Illness     72 y.o. male with a hx of hypertension, obesity, former smoker, last hemoglobin A1c 6.4 here for follow up evaluation of near syncopal episode, dizziness, excessive shortness of breath.  He was going to go to a [employer] basketball game and after driving from [place] to [place], he got out of the car and was rushing quite quickly to get to the stadium.  He stated that he usually did not walk this quickly.  He was feeling quite significant shortness of breath.  Just after going up a small flight of stairs which was only 50-60  yards from the car he all of a sudden started to get weak, had to lean up against a sign and went down.  He did not lose consciousness but he was quite close he states.  His son [name], noted that that he was breathing fairly significantly. He denied any chest discomfort.  He has not had any prior cardiovascular complaints.  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  Prior to his knee surgery approximately 5 years ago he did have a Lexiscan stress test which was low risk.  He has noted some shortness of breath with activity recently.  CT of cors with LAD and circ calcification. Unable to FFR. Normal EF. Mild elevated pulm pressure 38mmHg. Given findings he was set up for outpatient cardiac cath.   Hospital Course     Underwent cardiac noted above with successful PCI/DES x1 p/mLAD. Also noted mild calcification in the RCA and Cx. Plan for DAPT with ASA/plavix for at least one year. He was seen by Cardiac Rehab prior to discharge. Instructions/restrictions given. All questions answered. Added low dose Toprol XL. Instructed to follow his HR and BP as an outpatient. Radial cath site stable.   William Norton was seen by Dr. Ellyn Hack and determined stable for discharge home. Follow up in the office has been arranged. Medications are listed below.   _____________  Discharge Vitals Blood pressure (!) 174/90, pulse 70,  temperature 97.6 F (36.4 C), temperature source Oral, resp. rate 18, height 5' 11.5" (1.816 m), weight 235 lb (106.6 kg), SpO2 98 %.  Filed Weights   05/27/17 0555  Weight: 235 lb (106.6 kg)    Labs & Radiologic Studies    CBC Recent Labs    05/24/17 1447  WBC 6.6  HGB 14.5  HCT 42.2  MCV 88  PLT 829   Basic Metabolic Panel Recent Labs    05/24/17 1447  NA 138  K 4.6  CL 99  CO2 23  GLUCOSE 171*  BUN 18  CREATININE 0.96  CALCIUM 9.5   Liver Function Tests No results for input(s): AST,  ALT, ALKPHOS, BILITOT, PROT, ALBUMIN in the last 72 hours. No results for input(s): LIPASE, AMYLASE in the last 72 hours. Cardiac Enzymes No results for input(s): CKTOTAL, CKMB, CKMBINDEX, TROPONINI in the last 72 hours. BNP Invalid input(s): POCBNP D-Dimer No results for input(s): DDIMER in the last 72 hours. Hemoglobin A1C No results for input(s): HGBA1C in the last 72 hours. Fasting Lipid Panel No results for input(s): CHOL, HDL, LDLCALC, TRIG, CHOLHDL, LDLDIRECT in the last 72 hours. Thyroid Function Tests No results for input(s): TSH, T4TOTAL, T3FREE, THYROIDAB in the last 72 hours.  Invalid input(s): FREET3 _____________  Ct Coronary Morph W/cta Cor W/score W/ca W/cm &/or Wo/cm  Addendum Date: 05/21/2017   ADDENDUM REPORT: 05/21/2017 08:04 EXAM: OVER-READ INTERPRETATION  CT CHEST The following report is an over-read performed by radiologist Dr. Collene Leyden [facility] on 05/21/2017. This over-read does not include interpretation of cardiac or coronary anatomy or pathology. The coronary CTA interpretation by the cardiologist is attached. COMPARISON:  None. FINDINGS: Heart is upper limits normal in size. Visualized aorta is normal caliber. No adenopathy in the lower mediastinum or hila. Small hiatal hernia. Minimal dependent atelectasis in the lower lobes. No effusions. Imaging into the upper abdomen shows no acute findings. Chest wall soft tissues are unremarkable. No acute bony abnormality. IMPRESSION: No acute extra cardiac abnormality. Small hiatal hernia. Dependent atelectasis. Electronically Signed   By: Rolm Baptise M.D.   On: 05/21/2017 08:04   Addendum Date: 05/20/2017   ADDENDUM REPORT: 05/20/2017 12:51 ADDENDUM: CT sent for FFR. Unable to be processed for FFR due to motion artifact and misalignment. Dalton Aundra Dubin Electronically Signed   By: Loralie Champagne M.D.   On: 05/20/2017 12:51   Result Date: 05/21/2017 CLINICAL DATA:  Chest pain EXAM: Cardiac CTA MEDICATIONS:  Sub lingual nitro.4mg  x 2 and lopressor 5mg  IV x 1 TECHNIQUE: The patient was scanned on a Siemens 937 slice scanner. Gantry rotation speed was 240 msecs. Collimation was 0.9 mm. A 100 kV prospective scan was triggered in the ascending thoracic aorta centered around 35-75% of the R-R interval. Average HR during the scan was 60 bpm. The 3D data set was interpreted on a dedicated work station using MPR, MIP and VRT modes. A total of 80cc of contrast was used. FINDINGS: Non-cardiac: See separate report from [facility]. Calcium Score: 1479 Agatston units Coronary Arteries: left dominant with no anomalies LM: No plaque or stenosis. LAD system: Extensive mixed plaque in the proximal and mid LAD. Probably only mild stenosis, however there is one area of shadowing in the mid LAD that may be artifact but cannot rule out moderate stenosis. Circumflex: Large AV LCx gives rise to left PDA. Large OM1. Calcified plaque in the ostial LCx with mild stenosis. There is artifact involving the mid LCx and OM1 with shadowing,  cannot rule out moderate mid LCx stenosis. Mixed plaque in the distal LCx and left PDA with no more than mild stenosis. RCA: Nondominant vessel. Extensive calcified plaque, probably mild stenosis. IMPRESSION: 1. Coronary calcium score 1479 Agatston units places the patient in the 89th percentile for age and gender. This suggests high risk for future cardiac events. 2. Technically difficult study due to both extensive calcified plaque with blooming artifact and motion artifact involving the mid LAD and mid LCx. Cannot rule out significant mid LAD and mid LCx stenosis. I will send for CT FFR though not sure it will be able to be done due to artifact. [facility] Teaching laboratory technician Electronically Signed: By: Loralie Champagne M.D. On: 05/19/2017 16:24   Disposition   Pt is being discharged home today in good condition.  Follow-up Plans & Appointments    Follow-up Information    Burtis Junes, NP Follow up on  06/07/2017.   Specialties:  Nurse Practitioner, Interventional Cardiology, Cardiology, Radiology Why:  at 10am for your follow up appt.  Contact information: [place] [place] [address] [phone]          Discharge Instructions    Amb Referral to Cardiac Rehabilitation   Complete by:  As directed    Diagnosis:   PTCA Coronary Stents        Discharge Medications     Medication List    STOP taking these medications   celecoxib 200 MG capsule Commonly known as:  CELEBREX   esomeprazole 40 MG capsule Commonly known as:  [place] by:  pantoprazole 40 MG tablet     TAKE these medications   aspirin EC 81 MG tablet Take 1 tablet (81 mg total) by mouth daily.   atorvastatin 40 MG tablet Commonly known as:  LIPITOR Take 1 tablet (40 mg total) by mouth daily.   buPROPion 100 MG 12 hr tablet Commonly known as:  WELLBUTRIN SR Take 1 tablet (100 mg total) by mouth 2 (two) times daily.   clopidogrel 75 MG tablet Commonly known as:  PLAVIX Take 1 tablet (75 mg total) by mouth daily with breakfast. Start taking on:  05/28/2017   escitalopram 20 MG tablet Commonly known as:  LEXAPRO Take 1 tablet (20 mg total) by mouth daily.   losartan 100 MG tablet Commonly known as:  COZAAR Take 1 tablet (100 mg total) daily by mouth.   metoprolol succinate 25 MG 24 hr tablet Commonly known as:  TOPROL XL Take 1 tablet (25 mg total) by mouth daily.   multivitamin tablet Take 1 tablet by mouth daily.   nitroGLYCERIN 0.4 MG SL tablet Commonly known as:  NITROSTAT Place 1 tablet (0.4 mg total) under the tongue every 5 (five) minutes as needed.   pantoprazole 40 MG tablet Commonly known as:  PROTONIX Take 1 tablet (40 mg total) by mouth daily. Replaces:  esomeprazole 40 MG capsule        Aspirin prescribed at discharge?  Yes High Intensity Statin Prescribed? (Lipitor 40-80mg  or Crestor 20-40mg ): Yes Beta Blocker Prescribed? Yes For EF <40%,  was ACEI/ARB Prescribed? Yes ADP Receptor Inhibitor Prescribed? (i.e. Plavix etc.-Includes Medically Managed Patients): Yes For EF <40%, Aldosterone Inhibitor Prescribed? No: EF ok Was EF assessed during THIS hospitalization? Yes Was Cardiac Rehab II ordered? (Included Medically managed Patients): Yes   Outstanding Labs/Studies   N/a   Duration of Discharge Encounter   Greater than 30 minutes including physician time.  Signed, Reino Bellis NP-C 05/27/2017, 2:25  PM  Did well after PCI of LAD (+ FFR). OK for same day d/c.  Glenetta Hew, MD

## 2017-05-27 NOTE — Discharge Instructions (Signed)

## 2017-05-27 NOTE — Progress Notes (Signed)
[facility] for Cardiology notified concerning persistent hypertension with bradycardia.  Hydralazine given per recommendation as ordered.  Will continue to monitor continuously.

## 2017-05-27 NOTE — Research (Signed)
CADFEM Informed Consent   Subject Name: William Norton  Subject met inclusion and exclusion criteria.  The informed consent form, study requirements and expectations were reviewed with the subject and questions and concerns were addressed prior to the signing of the consent form.  The subject verbalized understanding of the trail requirements.  The subject agreed to participate in the CADFEM trial and signed the informed consent.  The informed consent was obtained prior to performance of any protocol-specific procedures for the subject.  A copy of the signed informed consent was given to the subject and a copy was placed in the subject's medical record.  Christena Flake 05/27/2017, 06:46 AM

## 2017-05-27 NOTE — Interval H&P Note (Signed)
History and Physical Interval Note:  05/27/2017 7:36 AM  William Norton  has presented today for surgery, with the diagnosis of abnormal coronary artery ct angiogram - shortness of breath  The various methods of treatment have been discussed with the patient and family. After consideration of risks, benefits and other options for treatment, the patient has consented to  Procedure(s): LEFT HEART CATH AND CORONARY ANGIOGRAPHY (N/A) with possible PERCUTANEOUS CORONARY INTERVENTION as a surgical intervention .  The patient's history has been reviewed, patient examined, no change in status, stable for surgery.  I have reviewed the patient's chart and labs.  Questions were answered to the patient's satisfaction.    Cath Lab Visit (complete for each Cath Lab visit)  Clinical Evaluation Leading to the Procedure:   ACS: No.  Non-ACS:    Anginal Classification: CCS II  Anti-ischemic medical therapy: Minimal Therapy (1 class of medications)  Non-Invasive Test Results: Equivocal test results  Prior CABG: No previous CABG   Glenetta Hew

## 2017-05-27 NOTE — Progress Notes (Signed)
In depth education of stent, Plavix, restrictions, diet, new DM and carb counting, ex, NTG, and CRPII. Pt and wife receptive, surprised by his A1C. Encouraged him to talk with PCP (lab just drawn 12/18). Will refer to [place]. He understands the importance of Plavix. [place], ACSM 11:59 AM 05/27/2017

## 2017-05-28 ENCOUNTER — Encounter (HOSPITAL_COMMUNITY): Payer: Self-pay | Admitting: Cardiology

## 2017-05-28 MED ORDER — ADENOSINE (DIAGNOSTIC) 140MCG/KG/MIN
INTRAVENOUS | Status: DC | PRN
  Administered 2017-05-27: 140 ug/kg/min via INTRAVENOUS

## 2017-06-01 ENCOUNTER — Telehealth (HOSPITAL_COMMUNITY): Payer: Self-pay

## 2017-06-01 DIAGNOSIS — R69 Illness, unspecified: Secondary | ICD-10-CM | POA: Diagnosis not present

## 2017-06-01 NOTE — Telephone Encounter (Signed)
Patients insurance is active and benefits verified through [facility] - $35.00 co-pay, no deductible, out of pocket amount of $5,900/$35.00 has been met, no co-insurance, and no pre-authorization is required. Passport/reference [id number]  Patient will be contacted and scheduled after review by the RN Navigator.

## 2017-06-02 ENCOUNTER — Encounter (INDEPENDENT_AMBULATORY_CARE_PROVIDER_SITE_OTHER): Payer: Self-pay

## 2017-06-02 ENCOUNTER — Telehealth (HOSPITAL_COMMUNITY): Payer: Self-pay

## 2017-06-02 NOTE — Telephone Encounter (Signed)
Called to speak with patient in regards to cardiac rehab - patient is interested in the program. Scheduled orientation on 07/22/2017 at 1:30pm. Patient will attend the 6:45am exc class.

## 2017-06-07 ENCOUNTER — Encounter: Payer: Self-pay | Admitting: Nurse Practitioner

## 2017-06-07 ENCOUNTER — Ambulatory Visit: Payer: Medicare HMO | Admitting: Nurse Practitioner

## 2017-06-07 VITALS — BP 122/78 | Ht 71.5 in | Wt 237.4 lb

## 2017-06-07 DIAGNOSIS — I259 Chronic ischemic heart disease, unspecified: Secondary | ICD-10-CM

## 2017-06-07 DIAGNOSIS — Z955 Presence of coronary angioplasty implant and graft: Secondary | ICD-10-CM

## 2017-06-07 DIAGNOSIS — R0602 Shortness of breath: Secondary | ICD-10-CM

## 2017-06-07 DIAGNOSIS — E7849 Other hyperlipidemia: Secondary | ICD-10-CM | POA: Diagnosis not present

## 2017-06-07 DIAGNOSIS — Z720 Tobacco use: Secondary | ICD-10-CM | POA: Diagnosis not present

## 2017-06-07 DIAGNOSIS — R69 Illness, unspecified: Secondary | ICD-10-CM | POA: Diagnosis not present

## 2017-06-07 DIAGNOSIS — I10 Essential (primary) hypertension: Secondary | ICD-10-CM | POA: Diagnosis not present

## 2017-06-07 LAB — CBC
Hematocrit: 41.6 % (ref 37.5–51.0)
Hemoglobin: 14.2 g/dL (ref 13.0–17.7)
MCH: 29.8 pg (ref 26.6–33.0)
MCHC: 34.1 g/dL (ref 31.5–35.7)
MCV: 87 fL (ref 79–97)
Platelets: 266 10*3/uL (ref 150–379)
RBC: 4.77 x10E6/uL (ref 4.14–5.80)
RDW: 14 % (ref 12.3–15.4)
WBC: 6 10*3/uL (ref 3.4–10.8)

## 2017-06-07 LAB — BASIC METABOLIC PANEL
BUN/Creatinine Ratio: 16 (ref 10–24)
BUN: 19 mg/dL (ref 8–27)
CO2: 19 mmol/L — ABNORMAL LOW (ref 20–29)
Calcium: 9.8 mg/dL (ref 8.6–10.2)
Chloride: 102 mmol/L (ref 96–106)
Creatinine, Ser: 1.17 mg/dL (ref 0.76–1.27)
GFR calc Af Amer: 72 mL/min/{1.73_m2} (ref >59)
GFR calc non Af Amer: 62 mL/min/{1.73_m2} (ref >59)
Glucose: 140 mg/dL — ABNORMAL HIGH (ref 65–99)
Potassium: 4.8 mmol/L (ref 3.5–5.2)
Sodium: 137 mmol/L (ref 134–144)

## 2017-06-07 MED ORDER — CLOPIDOGREL BISULFATE 75 MG PO TABS: 75.0000 mg | ORAL_TABLET | Freq: Every day | ORAL | 3 refills | Status: DC

## 2017-06-07 NOTE — Patient Instructions (Addendum)
We will be checking the following labs today - BMET & CBC   Medication Instructions:    Continue with your current medicines.   I have sent in your refill for the Plavix today    Testing/Procedures To Be Arranged:  N/A  Follow-Up:   See Dr. Marlou Porch at the end of the month as planned.   Referral to pulmonary - Dr. [place] Bells only    Other Special Instructions:   I have sent a note to cardiac rehab.  Here are my tips to lose weight:  1. Drink only water. You do not need milk, juice, tea, soda or diet soda.  2. Do not eat anything "white". This includes white bread, potatoes, rice or mayo  3. Stay away from fried foods and sweets  4. Your portion should be the size of the palm of your hand.  5. Know what your weaknesses are and avoid.   6. Find an exercise you like and do it every day for 45 to 60 minutes.          If you need a refill on your cardiac medications before your next appointment, please call your pharmacy.   Call the [place] office at [phone] if you have any questions, problems or concerns.
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
## 2017-06-10 ENCOUNTER — Encounter: Payer: Self-pay | Admitting: Internal Medicine

## 2017-06-10 MED ORDER — LOSARTAN POTASSIUM 100 MG PO TABS: 100.0000 mg | ORAL_TABLET | Freq: Every day | ORAL | 1 refills | Status: DC

## 2017-06-11 DIAGNOSIS — R69 Illness, unspecified: Secondary | ICD-10-CM | POA: Diagnosis not present

## 2017-06-15 ENCOUNTER — Encounter (INDEPENDENT_AMBULATORY_CARE_PROVIDER_SITE_OTHER): Payer: Self-pay

## 2017-06-15 NOTE — Telephone Encounter (Signed)
S/w pt stated Dr. Anastasia Pall office will call when ready to schedule appt.  Gave pt Dr. Anastasia Pall and can check status of appointment.

## 2017-06-16 NOTE — Telephone Encounter (Signed)
Please see prior note.  Called pt yesterday and was given Dr. Anastasia Pall phone number to call and check status of appt.

## 2017-06-21 DIAGNOSIS — F411 Generalized anxiety disorder: Secondary | ICD-10-CM | POA: Diagnosis not present

## 2017-06-21 DIAGNOSIS — R69 Illness, unspecified: Secondary | ICD-10-CM | POA: Diagnosis not present

## 2017-06-22 ENCOUNTER — Ambulatory Visit: Payer: Medicare HMO | Admitting: Cardiology

## 2017-06-22 ENCOUNTER — Other Ambulatory Visit: Payer: Self-pay | Admitting: Internal Medicine

## 2017-06-22 ENCOUNTER — Telehealth: Payer: Self-pay | Admitting: Internal Medicine

## 2017-06-22 ENCOUNTER — Encounter: Payer: Self-pay | Admitting: Cardiology

## 2017-06-22 ENCOUNTER — Telehealth: Payer: Self-pay

## 2017-06-22 VITALS — BP 110/68 | HR 78 | Ht 71.5 in | Wt 236.0 lb

## 2017-06-22 DIAGNOSIS — R0602 Shortness of breath: Secondary | ICD-10-CM

## 2017-06-22 DIAGNOSIS — Z87891 Personal history of nicotine dependence: Secondary | ICD-10-CM

## 2017-06-22 DIAGNOSIS — I259 Chronic ischemic heart disease, unspecified: Secondary | ICD-10-CM

## 2017-06-22 DIAGNOSIS — Z955 Presence of coronary angioplasty implant and graft: Secondary | ICD-10-CM

## 2017-06-22 MED ORDER — METFORMIN HCL 500 MG PO TABS: ORAL_TABLET | ORAL | 0 refills | Status: DC

## 2017-06-22 MED ORDER — TIOTROPIUM BROMIDE MONOHYDRATE 18 MCG IN CAPS: 18.0000 ug | ORAL_CAPSULE | Freq: Every day | RESPIRATORY_TRACT | 6 refills | Status: DC

## 2017-06-22 MED ORDER — LOSARTAN POTASSIUM 50 MG PO TABS: 50.0000 mg | ORAL_TABLET | Freq: Every day | ORAL | 3 refills | Status: DC

## 2017-06-22 NOTE — Telephone Encounter (Signed)
Spoke w/ Dr Marlou Porch, patient  is at the cardiology office, he continue to be SOB.  Further evaluation? Since the last office visit here, he underwent cardiac evaluation, had a stent but despite cardiac intervention he continue SOB. He is going to have spirometry in a couple of days. Also, needs better DM control Plan: Spiriva,  1 puff daily.  Send a prescription Pulmonary referral,  Dx SOB.  Former smoker. Add metformin 500 mg: 1 tablet twice a day.  #60, no refills.  First week take only 1 tablet daily to prevent side effects. Office visit here 4 weeks.

## 2017-06-22 NOTE — Patient Instructions (Addendum)
Medication Instructions:  Please decrease Cozaar to 50 mg a day. Continue all other medications as listed.  Follow-Up: Follow up in 2 months with Dr Marlou Porch.  If you need a refill on your cardiac medications before your next appointment, please call your pharmacy.  Thank you for choosing [facility]!!

## 2017-06-22 NOTE — Telephone Encounter (Signed)
PA initiated via Covermymeds; KEY: [id number]. Awaiting determination.
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
## 2017-06-22 NOTE — Telephone Encounter (Signed)
Rx's sent to [facility]. Pulmonary referral placed. Will reach out to Pt to schedule appt shortly.

## 2017-06-22 NOTE — Telephone Encounter (Signed)
William Norton- can you call Pt- needs follow-up w/ PCP in 4 weeks. Thank you.

## 2017-06-22 NOTE — Telephone Encounter (Signed)
PA approved. Effective 04/25/2017 through 04/26/2018.  

## 2017-06-22 NOTE — Telephone Encounter (Signed)
Patient returned call and scheduled with PCP for 07/20/2017

## 2017-06-22 NOTE — Telephone Encounter (Signed)
Called pt and LVM advising him to call back and schedule a follow up visit in one month.

## 2017-06-23 ENCOUNTER — Other Ambulatory Visit: Payer: Self-pay

## 2017-06-23 DIAGNOSIS — R69 Illness, unspecified: Secondary | ICD-10-CM | POA: Diagnosis not present

## 2017-06-23 MED ORDER — METFORMIN HCL 500 MG PO TABS: 500.0000 mg | ORAL_TABLET | Freq: Two times a day (BID) | ORAL | 0 refills | Status: DC

## 2017-06-24 ENCOUNTER — Ambulatory Visit (HOSPITAL_COMMUNITY)
Admission: RE | Admit: 2017-06-24 | Discharge: 2017-06-24 | Disposition: A | Discharge status: Home / Self Care | Payer: Medicare HMO | Source: Ambulatory Visit | Attending: Nurse Practitioner | Admitting: Nurse Practitioner

## 2017-06-24 DIAGNOSIS — Z720 Tobacco use: Secondary | ICD-10-CM | POA: Diagnosis present

## 2017-06-24 DIAGNOSIS — R0602 Shortness of breath: Secondary | ICD-10-CM | POA: Insufficient documentation

## 2017-06-24 LAB — PULMONARY FUNCTION TEST
DL/VA % pred: 73 %
DL/VA: 3.4 ml/min/mmHg/L
DLCO cor % pred: 63 %
DLCO cor: 21.36 ml/min/mmHg
DLCO unc % pred: 62 %
DLCO unc: 21.11 ml/min/mmHg
FEF 25-75 Post: 3.08 L/sec
FEF 25-75 Pre: 3.06 L/sec
FEF2575-%Change-Post: 0 %
FEF2575-%Pred-Post: 126 %
FEF2575-%Pred-Pre: 126 %
FEV1-%Change-Post: 0 %
FEV1-%Pred-Post: 99 %
FEV1-%Pred-Pre: 100 %
FEV1-Post: 3.27 L
FEV1-Pre: 3.28 L
FEV1FVC-%Change-Post: 2 %
FEV1FVC-%Pred-Pre: 108 %
FEV6-%Change-Post: -3 %
FEV6-%Pred-Post: 94 %
FEV6-%Pred-Pre: 98 %
FEV6-Post: 3.99 L
FEV6-Pre: 4.15 L
FEV6FVC-%Change-Post: 0 %
FEV6FVC-%Pred-Post: 106 %
FEV6FVC-%Pred-Pre: 106 %
FVC-%Change-Post: -2 %
FVC-%Pred-Post: 90 %
FVC-%Pred-Pre: 92 %
FVC-Post: 4.05 L
FVC-Pre: 4.16 L
Post FEV1/FVC ratio: 81 %
Post FEV6/FVC ratio: 100 %
Pre FEV1/FVC ratio: 79 %
Pre FEV6/FVC Ratio: 100 %
RV % pred: 95 %
RV: 2.46 L
TLC % pred: 94 %
TLC: 6.87 L

## 2017-06-24 MED ORDER — ALBUTEROL SULFATE (2.5 MG/3ML) 0.083% IN NEBU
2.5000 mg | INHALATION_SOLUTION | Freq: Once | RESPIRATORY_TRACT | Status: AC
Start: 2017-06-24 — End: 2017-06-24
  Administered 2017-06-24: 2.5 mg via RESPIRATORY_TRACT

## 2017-06-29 ENCOUNTER — Telehealth: Payer: Self-pay | Admitting: *Deleted

## 2017-06-29 NOTE — Telephone Encounter (Signed)
Pt wanted to know the outcome of PFT went over results with pt and stated Dr. Anastasia Pall office set up an appointment.  Gave time and place.  Pt stated appreciation.

## 2017-07-12 ENCOUNTER — Encounter: Payer: Self-pay | Admitting: Internal Medicine

## 2017-07-13 ENCOUNTER — Telehealth (HOSPITAL_COMMUNITY): Payer: Self-pay | Admitting: Pharmacist

## 2017-07-13 ENCOUNTER — Telehealth: Payer: Self-pay | Admitting: Internal Medicine

## 2017-07-13 NOTE — Telephone Encounter (Signed)
Cardiac Rehab Medication Review by a Pharmacist  Does the patient  feel that his/her medications are working for him/her?  yes  Has the patient been experiencing any side effects to the medications prescribed?  Yes--nausea and weakness occasionally, has recently started metformin  Does the patient measure his/her own blood pressure or blood glucose at home?  no   Does the patient have any problems obtaining medications due to transportation or finances?   no  Understanding of regimen: good Understanding of indications: good Potential of compliance: good    Pharmacist comments: William Norton is a 72 y.o. male with an upcoming cardiac rehab appointment. Patient is very pleasant during our conversation over the phone this evening. Patient reports all medications, doses and instructions verbally. Patient reports that he is not taking his Spiriva at this time because he doesn't "want to pay $250 for the medication." I discussed with patient to bring this up to his PCP to see if there was an alternative on his insurance and patient verbalized understanding. Patient endorses recently starting metformin and has been experiencing some nausea. Counseled patient this side effect usually subsides in the first couple of weeks and patient verbalized understanding and is willing to continue taking the medication at this time. Patient had no further questions or concerns at this time.    [name], Pharm.D. PGY1 Pharmacy Resident 07/13/2017 6:09 PM

## 2017-07-13 NOTE — Telephone Encounter (Signed)
thx

## 2017-07-13 NOTE — Telephone Encounter (Signed)
Called pt. Pt reports having episodes of nausea and very mild weakness on and off for "the past week or so". Pt states he feels perfectly fine right now. Pt states the nausea and weak feeling generally occur after he takes all of his medicine in the morning on an empty stomach. Pt said that some of his meds state to "take with food" on the label, but that he has been "lax" about doing that. Pt reports taking his medication with food this morning and did not have the weak/ nausea experience. Pt does not wish to seek medical treatment at this time of call, but did agree to appointment tomorrow with PCP 07/14/17 @1  and agrees to call 911 if in need of emergency medical attention.

## 2017-07-13 NOTE — Telephone Encounter (Signed)
See patient's message, please triage William Norton. He complained of nausea and weakness, based on triage please make an appointment for tomorrow at this office or send him to the ER.

## 2017-07-14 ENCOUNTER — Ambulatory Visit (HOSPITAL_BASED_OUTPATIENT_CLINIC_OR_DEPARTMENT_OTHER)
Admission: RE | Admit: 2017-07-14 | Discharge: 2017-07-14 | Disposition: A | Discharge status: Home / Self Care | Payer: Medicare HMO | Source: Ambulatory Visit | Attending: Internal Medicine | Admitting: Internal Medicine

## 2017-07-14 ENCOUNTER — Encounter: Payer: Self-pay | Admitting: Internal Medicine

## 2017-07-14 ENCOUNTER — Ambulatory Visit (INDEPENDENT_AMBULATORY_CARE_PROVIDER_SITE_OTHER): Payer: Medicare HMO | Admitting: Internal Medicine

## 2017-07-14 VITALS — BP 124/74 | HR 74 | Temp 97.8°F | Resp 18 | Ht 71.5 in | Wt 234.1 lb

## 2017-07-14 DIAGNOSIS — M2550 Pain in unspecified joint: Secondary | ICD-10-CM

## 2017-07-14 DIAGNOSIS — R5383 Other fatigue: Secondary | ICD-10-CM

## 2017-07-14 DIAGNOSIS — R768 Other specified abnormal immunological findings in serum: Secondary | ICD-10-CM | POA: Diagnosis not present

## 2017-07-14 DIAGNOSIS — R0609 Other forms of dyspnea: Secondary | ICD-10-CM | POA: Diagnosis not present

## 2017-07-14 DIAGNOSIS — R69 Illness, unspecified: Secondary | ICD-10-CM | POA: Diagnosis not present

## 2017-07-14 DIAGNOSIS — R0600 Dyspnea, unspecified: Secondary | ICD-10-CM

## 2017-07-14 DIAGNOSIS — R7982 Elevated C-reactive protein (CRP): Secondary | ICD-10-CM | POA: Diagnosis not present

## 2017-07-14 DIAGNOSIS — F419 Anxiety disorder, unspecified: Secondary | ICD-10-CM | POA: Diagnosis not present

## 2017-07-14 DIAGNOSIS — F32A Depression, unspecified: Secondary | ICD-10-CM

## 2017-07-14 DIAGNOSIS — F329 Major depressive disorder, single episode, unspecified: Secondary | ICD-10-CM | POA: Diagnosis not present

## 2017-07-14 DIAGNOSIS — R0602 Shortness of breath: Secondary | ICD-10-CM | POA: Diagnosis not present

## 2017-07-14 LAB — TSH: TSH: 1.81 u[IU]/mL (ref 0.35–4.50)

## 2017-07-14 LAB — CBC WITH DIFFERENTIAL/PLATELET
BASOS PCT: 0.7 % (ref 0.0–3.0)
Basophils Absolute: 0 10*3/uL (ref 0.0–0.1)
EOS PCT: 5.2 % — AB (ref 0.0–5.0)
Eosinophils Absolute: 0.3 10*3/uL (ref 0.0–0.7)
HEMATOCRIT: 40.6 % (ref 39.0–52.0)
HEMOGLOBIN: 13.9 g/dL (ref 13.0–17.0)
LYMPHS PCT: 29.3 % (ref 12.0–46.0)
Lymphs Abs: 1.9 10*3/uL (ref 0.7–4.0)
MCHC: 34.3 g/dL (ref 30.0–36.0)
MCV: 88.4 fl (ref 78.0–100.0)
MONO ABS: 0.5 10*3/uL (ref 0.1–1.0)
MONOS PCT: 8.4 % (ref 3.0–12.0)
Neutro Abs: 3.7 10*3/uL (ref 1.4–7.7)
Neutrophils Relative %: 56.4 % (ref 43.0–77.0)
Platelets: 290 10*3/uL (ref 150.0–400.0)
RBC: 4.6 Mil/uL (ref 4.22–5.81)
RDW: 14.1 % (ref 11.5–15.5)
WBC: 6.6 10*3/uL (ref 4.0–10.5)

## 2017-07-14 LAB — SEDIMENTATION RATE: Sed Rate: 39 mm/hr — ABNORMAL HIGH (ref 0–20)

## 2017-07-14 LAB — B12 AND FOLATE PANEL
Folate: >23.6 ng/mL (ref >5.9)
Vitamin B-12: 757 pg/mL (ref 211–911)

## 2017-07-14 NOTE — Patient Instructions (Signed)
GO TO THE LAB : Get the blood work     GO TO THE FRONT DESK Schedule your next appointment for a checkup in 4 weeks   STOP BY THE FIRST FLOOR:  get the XR
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
## 2017-07-14 NOTE — Progress Notes (Signed)
Pre visit review using our clinic review tool, if applicable. No additional management support is needed unless otherwise documented below in the visit note. 

## 2017-07-15 LAB — BASIC METABOLIC PANEL
BUN: 17 mg/dL (ref 6–23)
CHLORIDE: 105 meq/L (ref 96–112)
CO2: 21 mEq/L (ref 19–32)
Calcium: 10 mg/dL (ref 8.4–10.5)
Creatinine, Ser: 0.91 mg/dL (ref 0.40–1.50)
GFR: 86.82 mL/min (ref >60.00)
Glucose, Bld: 101 mg/dL — ABNORMAL HIGH (ref 70–99)
POTASSIUM: 4.3 meq/L (ref 3.5–5.1)
SODIUM: 140 meq/L (ref 135–145)

## 2017-07-15 LAB — HIGH SENSITIVITY CRP: CRP, High Sensitivity: 6.45 mg/L — ABNORMAL HIGH (ref 0.000–5.000)

## 2017-07-15 LAB — CK: CK TOTAL: 79 U/L (ref 7–232)

## 2017-07-15 NOTE — Assessment & Plan Note (Signed)
"  Not feeling well": Symptoms are nonspecific, probably multifactorial (multiple medicines, depression, etc.). Interestingly he is having aches and pains (arthralgias, myalgias) for the last few weeks noting he started statins for the first time 05/24/2017. Previously, ANA was positive back in 2013.  Saw  Dr. Charlestine Night in 2014, dx DJD DDX for arthralgias and myalgias includes PMR, rheumatoid arthritis, side effects of medications and others. Plan: TSH, sed rate, CRP, CK, BMP, CBC, ANA, rheumatoid factor. With results, will consider hold temporarily Lipitor. Pulmonary disease: After cardiac workup he continue with DOE, we tried spiriva but could not afford it, had a PFT showing diffusion defects, possibly pulmonary vascular process.  Pulmonary eval pending.  Get a Chest x-ray Anxiety depression: Saw psychiatry, Wellbutrin stopped, Lexapro increased to 40 mg.  Had counseling and plans to continue see the counselor.  No suicidal ideas at this point. DM: Recently started metformin. RTC 4 weeks

## 2017-07-17 LAB — VITAMIN D 1,25 DIHYDROXY
VITAMIN D3 1, 25 (OH): 54 pg/mL
Vitamin D 1, 25 (OH)2 Total: 54 pg/mL (ref 18–72)

## 2017-07-17 LAB — ANTI-NUCLEAR AB-TITER (ANA TITER): ANA Titer 1: 1:320 {titer} — ABNORMAL HIGH

## 2017-07-17 LAB — ANA: Anti Nuclear Antibody(ANA): POSITIVE — AB

## 2017-07-17 LAB — RHEUMATOID FACTOR

## 2017-07-19 DIAGNOSIS — R69 Illness, unspecified: Secondary | ICD-10-CM | POA: Diagnosis not present

## 2017-07-19 DIAGNOSIS — F411 Generalized anxiety disorder: Secondary | ICD-10-CM | POA: Diagnosis not present

## 2017-07-20 ENCOUNTER — Ambulatory Visit: Payer: Medicare HMO | Admitting: Internal Medicine

## 2017-07-20 NOTE — Addendum Note (Signed)
Addended byDamita Dunnings D on: 07/20/2017 09:03 AM   Modules accepted: Orders

## 2017-07-22 ENCOUNTER — Encounter (HOSPITAL_COMMUNITY)
Admission: RE | Admit: 2017-07-22 | Discharge: 2017-07-22 | Disposition: A | Discharge status: Home / Self Care | Payer: Medicare HMO | Source: Ambulatory Visit | Attending: Cardiology | Admitting: Cardiology

## 2017-07-22 ENCOUNTER — Encounter (HOSPITAL_COMMUNITY): Payer: Self-pay

## 2017-07-22 DIAGNOSIS — Z955 Presence of coronary angioplasty implant and graft: Secondary | ICD-10-CM | POA: Diagnosis not present

## 2017-07-22 HISTORY — DX: Type 2 diabetes mellitus without complications: E11.9

## 2017-07-22 HISTORY — DX: Hyperlipidemia, unspecified: E78.5

## 2017-07-22 LAB — GLUCOSE, CAPILLARY: GLUCOSE-CAPILLARY: 152 mg/dL — AB (ref 65–99)
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
## 2017-07-23 ENCOUNTER — Encounter: Payer: Self-pay | Admitting: Pulmonary Disease

## 2017-07-23 ENCOUNTER — Ambulatory Visit: Payer: Medicare HMO | Admitting: Pulmonary Disease

## 2017-07-23 VITALS — BP 120/70 | HR 95 | Ht 71.5 in | Wt 231.6 lb

## 2017-07-23 DIAGNOSIS — R400 Somnolence: Secondary | ICD-10-CM

## 2017-07-23 DIAGNOSIS — R918 Other nonspecific abnormal finding of lung field: Secondary | ICD-10-CM

## 2017-07-23 DIAGNOSIS — R0600 Dyspnea, unspecified: Secondary | ICD-10-CM | POA: Diagnosis not present

## 2017-07-23 MED ORDER — UMECLIDINIUM-VILANTEROL 62.5-25 MCG/INH IN AEPB: 1.0000 | INHALATION_SPRAY | Freq: Every day | RESPIRATORY_TRACT | 0 refills | Status: DC
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
## 2017-07-23 NOTE — Patient Instructions (Signed)
Shortness of breath: We are going to get a high-resolution CT scan of the chest to figure out if there is anything else going on in your lungs You need to exercise more frequently, start cardiac rehab soon Take Anoro 1 puff daily no matter how you feel  Abnormal lung exam on physical exam: As stated above we will get a CT scan of the chest to assess further  Daytime fatigue and sleepiness: I think there is a high likelihood that you have sleep apnea, were going to arrange for a sleep study to assess further  We will see you back in 2-4 weeks or sooner if needed

## 2017-07-26 ENCOUNTER — Encounter (HOSPITAL_COMMUNITY)
Admission: RE | Admit: 2017-07-26 | Discharge: 2017-07-26 | Disposition: A | Discharge status: Home / Self Care | Payer: Medicare HMO | Source: Ambulatory Visit | Attending: Cardiology | Admitting: Cardiology

## 2017-07-26 ENCOUNTER — Encounter (HOSPITAL_COMMUNITY): Payer: Self-pay

## 2017-07-26 DIAGNOSIS — I10 Essential (primary) hypertension: Secondary | ICD-10-CM | POA: Diagnosis not present

## 2017-07-26 DIAGNOSIS — E119 Type 2 diabetes mellitus without complications: Secondary | ICD-10-CM | POA: Insufficient documentation

## 2017-07-26 DIAGNOSIS — Z955 Presence of coronary angioplasty implant and graft: Secondary | ICD-10-CM | POA: Diagnosis not present

## 2017-07-26 LAB — GLUCOSE, CAPILLARY
GLUCOSE-CAPILLARY: 129 mg/dL — AB (ref 65–99)
GLUCOSE-CAPILLARY: 72 mg/dL (ref 65–99)
Glucose-Capillary: 113 mg/dL — ABNORMAL HIGH (ref 65–99)

## 2017-07-26 NOTE — Progress Notes (Signed)
Daily Session Note  Patient Details  Name: William Norton MRN: [id number] Date of Birth: [birth year] Referring Provider:   Flowsheet Row CARDIAC REHAB PHASE II ORIENTATION from 07/22/2017 in [facility]  Referring Provider  Candee Furbish MD      Encounter Date: 07/26/2017  Check In: Session Check In - 07/26/17 0645    Check-In          Location  [facility]    Staff Present  Dorma Russell, MS,ACSM CEP, Exercise Physiologist;Other;Portia Rollene Rotunda, RN, BSN;Dorman Calderwood, RN, BSN    Supervising physician immediately available to respond to emergencies  [facility] immediately available    Physician(s)  Dr. Posey Pronto    Medication changes reported      No    Fall or balance concerns reported     No    Tobacco Cessation  No Change    Warm-up and Cool-down  Performed as group-led instruction    Resistance Training Performed  Yes    VAD Patient?  No        Pain Assessment          Currently in Pain?  No/denies    Multiple Pain Sites  No           Capillary Blood Glucose: Results for orders placed or performed during the hospital encounter of 07/26/17 (from the past 24 hour(s))  Glucose, capillary     Status: Abnormal   Collection Time: 07/26/17  7:04 AM  Result Value Ref Range   Glucose-Capillary 113 (H) 65 - 99 mg/dL  Glucose, capillary     Status: None   Collection Time: 07/26/17  7:52 AM  Result Value Ref Range   Glucose-Capillary 72 65 - 99 mg/dL  Glucose, capillary     Status: Abnormal   Collection Time: 07/26/17  8:20 AM  Result Value Ref Range   Glucose-Capillary 129 (H) 65 - 99 mg/dL    Exercise Prescription Changes - 07/26/17 1600    Response to Exercise          Blood Pressure (Admit)  130/70    Blood Pressure (Exercise)  146/80    Blood Pressure (Exit)  112/70    Heart Rate (Admit)  72 bpm    Heart Rate (Exercise)  90 bpm    Heart Rate (Exit)  55 bpm    Rating of Perceived Exertion (Exercise)  12    Perceived  Dyspnea (Exercise)  0    Symptoms  None    Comments  Pt oriented to exericse equipment    Duration  Progress to 30 minutes of  aerobic without signs/symptoms of physical distress    Intensity  THRR New        Progression          Progression  Continue to progress workloads to maintain intensity without signs/symptoms of physical distress.    Average METs  2.22        Resistance Training          Training Prescription  No        Recumbant Bike          Level  2    Minutes  10    METs  2.45        NuStep          Level  2    SPM  85    Minutes  10    METs  2  Arm Ergometer          Level  2    Minutes  10    METs  2.23           Social History   Tobacco Use  Smoking Status Former Smoker  . Packs/day: 2.00  . Years: 40.00  . Pack years: 80.00  . Last attempt to quit: 08/26/2011  . Years since quitting: 5.9  Smokeless Tobacco Never Used  Tobacco Comment   quit 08-2011     Goals Met:  Exercise tolerated well  Goals Unmet:  Not Applicable  Comments: Pt started cardiac rehab today.  Pt tolerated light exercise without difficulty. VSS, telemetry- sinus rhythm,  asymptomatic.  Medication list reconciled. Pt denies barriers to medicaiton compliance. Pt reports he is unable to afford spiriva as prescribed by Dr. Lake Bells.    PSYCHOSOCIAL ASSESSMENT:  PHQ-16.  Pt  With known history of depression currently treated by counselor and PCP.   Pt enjoys housework including remodeling and painting. Pt is eager to be able to resume this activity. Pt also notes his hearing aids are not presently working.  Pt goals are to increase stregth/stamina.    Pt oriented to exercise equipment and routine.    Understanding verbalized.   Dr. Fransico Him is Medical Director for Cardiac Rehab at [facility].

## 2017-07-28 ENCOUNTER — Encounter (HOSPITAL_COMMUNITY)
Admission: RE | Admit: 2017-07-28 | Discharge: 2017-07-28 | Disposition: A | Discharge status: Home / Self Care | Payer: Medicare HMO | Source: Ambulatory Visit | Attending: Cardiology | Admitting: Cardiology

## 2017-07-28 DIAGNOSIS — Z955 Presence of coronary angioplasty implant and graft: Secondary | ICD-10-CM

## 2017-07-28 DIAGNOSIS — E119 Type 2 diabetes mellitus without complications: Secondary | ICD-10-CM | POA: Diagnosis not present

## 2017-07-28 DIAGNOSIS — I10 Essential (primary) hypertension: Secondary | ICD-10-CM | POA: Diagnosis not present

## 2017-07-28 LAB — GLUCOSE, CAPILLARY
GLUCOSE-CAPILLARY: 101 mg/dL — AB (ref 65–99)
Glucose-Capillary: 122 mg/dL — ABNORMAL HIGH (ref 65–99)

## 2017-07-30 ENCOUNTER — Encounter (HOSPITAL_COMMUNITY): Payer: Medicare HMO

## 2017-08-02 ENCOUNTER — Encounter (HOSPITAL_COMMUNITY)
Admission: RE | Admit: 2017-08-02 | Discharge: 2017-08-02 | Disposition: A | Discharge status: Home / Self Care | Payer: Medicare HMO | Source: Ambulatory Visit | Attending: Cardiology | Admitting: Cardiology

## 2017-08-02 DIAGNOSIS — Z955 Presence of coronary angioplasty implant and graft: Secondary | ICD-10-CM

## 2017-08-03 ENCOUNTER — Inpatient Hospital Stay: Admission: RE | Admit: 2017-08-03 | Payer: Medicare HMO | Source: Ambulatory Visit

## 2017-08-04 ENCOUNTER — Encounter (HOSPITAL_COMMUNITY)
Admission: RE | Admit: 2017-08-04 | Discharge: 2017-08-04 | Disposition: A | Discharge status: Home / Self Care | Payer: Medicare HMO | Source: Ambulatory Visit | Attending: Cardiology | Admitting: Cardiology

## 2017-08-04 DIAGNOSIS — Z955 Presence of coronary angioplasty implant and graft: Secondary | ICD-10-CM

## 2017-08-06 ENCOUNTER — Encounter (HOSPITAL_COMMUNITY): Payer: Medicare HMO

## 2017-08-09 ENCOUNTER — Encounter (HOSPITAL_COMMUNITY): Payer: Medicare HMO

## 2017-08-09 ENCOUNTER — Telehealth (HOSPITAL_COMMUNITY): Payer: Self-pay | Admitting: *Deleted

## 2017-08-09 ENCOUNTER — Other Ambulatory Visit: Payer: Self-pay

## 2017-08-09 DIAGNOSIS — F411 Generalized anxiety disorder: Secondary | ICD-10-CM | POA: Diagnosis not present

## 2017-08-09 DIAGNOSIS — R400 Somnolence: Secondary | ICD-10-CM

## 2017-08-09 DIAGNOSIS — R69 Illness, unspecified: Secondary | ICD-10-CM | POA: Diagnosis not present

## 2017-08-09 NOTE — Telephone Encounter (Signed)
Pt called in regards to recent absences from cardiac rehab.  Pt reports lack of motivation to get out of the house.  Pt encouraged to attend Wednesdays session and he stated he will try. Emotional support given to patient.

## 2017-08-11 ENCOUNTER — Encounter (HOSPITAL_BASED_OUTPATIENT_CLINIC_OR_DEPARTMENT_OTHER): Payer: Medicare HMO

## 2017-08-11 ENCOUNTER — Ambulatory Visit: Payer: Medicare HMO | Admitting: Internal Medicine

## 2017-08-11 ENCOUNTER — Telehealth (HOSPITAL_COMMUNITY): Payer: Self-pay | Admitting: *Deleted

## 2017-08-11 ENCOUNTER — Encounter (HOSPITAL_COMMUNITY): Payer: Self-pay

## 2017-08-11 ENCOUNTER — Encounter (HOSPITAL_COMMUNITY): Payer: Medicare HMO

## 2017-08-11 DIAGNOSIS — Z0289 Encounter for other administrative examinations: Secondary | ICD-10-CM

## 2017-08-11 NOTE — Telephone Encounter (Signed)
Pt unable to make it to his cardiac rehab appointment at [facility] again today d/t "not wanting to get out of the bed." Offered patient a later class time.  Pt will try 1445 class time.  RN navigator made aware of request.  Inquired from pt if he had made appointment with his counselor that he has been established with.  Pt said that he had an appointment this week.  Emotional support and encouragement given to patient.

## 2017-08-11 NOTE — Progress Notes (Signed)
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 07/28/2017 in [place]  Date  07/28/17  Instruction Review Code  2- Demonstrated Understanding      Exercising on Your Own:  -Group instruction provided by verbal instruction, power point, and written materials to support subject.  Instructors discuss benefits of exercise, components of exercise, frequency and intensity of exercise, and end points for exercise.  Also discuss use of nitroglycerin and activating EMS.  Review options of places to exercise outside of rehab.  Review guidelines for sex with heart disease.   Cardiac Drugs I:  -Group instruction provided by verbal  instruction and written materials to support subject.  Instructor reviews cardiac drug classes: antiplatelets, anticoagulants, beta blockers, and  statins.  Instructor discusses reasons, side effects, and lifestyle considerations for each drug class.   Cardiac Drugs II:  -Group instruction provided by verbal instruction and written materials to support subject.  Instructor reviews cardiac drug classes: angiotensin converting enzyme inhibitors (ACE-I), angiotensin II receptor blockers (ARBs), nitrates, and calcium channel blockers.  Instructor discusses reasons, side effects, and lifestyle considerations for each drug class.   Anatomy and Physiology of the Circulatory System:  Group verbal and written instruction and models provide basic cardiac anatomy and physiology, with the coronary electrical and arterial systems. Review of: AMI, Angina, Valve disease, Heart Failure, Peripheral Artery Disease, Cardiac Arrhythmia, Pacemakers, and the ICD.   Other Education:  -Group or individual verbal, written, or video instructions that support the educational goals of the cardiac rehab program.   Holiday Eating Survival Tips:  -Group instruction provided by PowerPoint slides, verbal discussion, and written materials to support subject matter. The instructor gives patients tips, tricks, and techniques to help them not only survive but enjoy the holidays despite the onslaught of food that accompanies the holidays.   Knowledge Questionnaire Score: Knowledge Questionnaire Score - 07/22/17 1553      Knowledge Questionnaire Score   Pre Score  22/24       Core Components/Risk Factors/Patient Goals at Admission: Personal Goals and Risk Factors at Admission - 07/22/17 1555      Core Components/Risk Factors/Patient Goals on Admission   Admit Weight  234 lb 12.6 oz (106.5 kg)    Goal Weight: Short Term  224 lb (101.6 kg)    Goal Weight: Long Term  214 lb (97.1 kg)       Core Components/Risk Factors/Patient Goals Review:  Goals and Risk Factor Review    Row Name 07/26/17 1639 08/11/17 1634           Core Components/Risk Factors/Patient  Goals Review   Personal Goals Review  Weight Management/Obesity;Improve shortness of breath with ADL's;Diabetes;Hypertension;Lipids  Weight Management/Obesity;Improve shortness of breath with ADL's;Diabetes;Hypertension;Lipids      Review  pt with multiple CAD RF demonstrates eagerness to participate in CR activities. pt goals for rehab are to increase strength/stamina.    Pt with multiple CAD RF with consecutive absences.  He has missed six sessions.       Expected Outcomes  pt will participate in CR exercise, nutrition and lifestyle modification activities.   Pt will participate in CR exercise, nutrition, and lifestyle modification opportunities.          Core Components/Risk Factors/Patient Goals at Discharge (Final Review):  Goals and Risk Factor Review - 08/11/17 1634      Core Components/Risk Factors/Patient Goals Review   Personal Goals Review  Weight Management/Obesity;Improve shortness of breath with ADL's;Diabetes;Hypertension;Lipids    Review  Pt with multiple CAD RF with consecutive absences.  He has missed six sessions.     Expected Outcomes  Pt will participate in CR exercise, nutrition, and lifestyle modification opportunities.        ITP Comments: ITP Comments    Row Name 07/22/17 1544 07/26/17 1635 08/11/17 1632       ITP Comments  Dr. Fransico Him, Medical Director   pt started group exercise. pt tolerated light activity without difficulty.  pt demonstrates willingness to participate in CR program.    30 Day ITP review. Pt with continued recent absences d/t "not wanting to get out of bed."  He has missed 6 sessions in a row.         Comments: See ITP comments.

## 2017-08-12 ENCOUNTER — Ambulatory Visit (HOSPITAL_COMMUNITY): Payer: Self-pay | Admitting: *Deleted

## 2017-08-13 ENCOUNTER — Ambulatory Visit: Payer: Medicare HMO | Admitting: Cardiology

## 2017-08-13 ENCOUNTER — Encounter (HOSPITAL_COMMUNITY): Payer: Medicare HMO

## 2017-08-13 ENCOUNTER — Telehealth: Payer: Self-pay | Admitting: Internal Medicine

## 2017-08-13 ENCOUNTER — Encounter (HOSPITAL_COMMUNITY)
Admission: RE | Admit: 2017-08-13 | Discharge: 2017-08-13 | Disposition: A | Discharge status: Home / Self Care | Payer: Medicare HMO | Source: Ambulatory Visit | Attending: Cardiology | Admitting: Cardiology

## 2017-08-13 DIAGNOSIS — Z955 Presence of coronary angioplasty implant and graft: Secondary | ICD-10-CM

## 2017-08-13 DIAGNOSIS — E119 Type 2 diabetes mellitus without complications: Secondary | ICD-10-CM | POA: Diagnosis not present

## 2017-08-13 DIAGNOSIS — I10 Essential (primary) hypertension: Secondary | ICD-10-CM | POA: Diagnosis not present

## 2017-08-13 LAB — GLUCOSE, CAPILLARY
GLUCOSE-CAPILLARY: 200 mg/dL — AB (ref 65–99)
Glucose-Capillary: 122 mg/dL — ABNORMAL HIGH (ref 65–99)
Glucose-Capillary: 58 mg/dL — ABNORMAL LOW (ref 65–99)

## 2017-08-13 NOTE — Progress Notes (Signed)
Pt's CBG post exercise was 58. Recheck after glucose gel and 8oz Ginger Ale was 200.  Pt with symptoms of unsteadiness and dizziness.  Pt ate cereal for lunch around 1200.  Pt reports taking 500 mg Metformin once a day versus twice a day as is prescribed.  Encouraged pt to drink water and eat a balanced meal today and prior to his next visits for cardiac rehab.

## 2017-08-13 NOTE — Telephone Encounter (Signed)
Was @ Rehab w/ hypoglycemia, only on metformin BID for DM, last A1C 7. Rec to decrease to metformin once a day, avoid prolonged fasting

## 2017-08-16 ENCOUNTER — Encounter (HOSPITAL_COMMUNITY)
Admission: RE | Admit: 2017-08-16 | Discharge: 2017-08-16 | Disposition: A | Discharge status: Home / Self Care | Payer: Medicare HMO | Source: Ambulatory Visit | Attending: Cardiology | Admitting: Cardiology

## 2017-08-16 ENCOUNTER — Encounter (HOSPITAL_COMMUNITY): Payer: Medicare HMO

## 2017-08-16 DIAGNOSIS — E119 Type 2 diabetes mellitus without complications: Secondary | ICD-10-CM | POA: Diagnosis not present

## 2017-08-16 DIAGNOSIS — I10 Essential (primary) hypertension: Secondary | ICD-10-CM | POA: Diagnosis not present

## 2017-08-16 DIAGNOSIS — Z955 Presence of coronary angioplasty implant and graft: Secondary | ICD-10-CM | POA: Diagnosis not present

## 2017-08-16 LAB — GLUCOSE, CAPILLARY: Glucose-Capillary: 96 mg/dL (ref 65–99)

## 2017-08-16 NOTE — Telephone Encounter (Signed)
MyChart message sent to Pt. Med list updated.

## 2017-08-17 LAB — GLUCOSE, CAPILLARY: GLUCOSE-CAPILLARY: 139 mg/dL — AB (ref 65–99)

## 2017-08-17 MED FILL — Glucose Gel 40%: ORAL | Qty: 1 | Status: AC

## 2017-08-18 ENCOUNTER — Encounter (HOSPITAL_COMMUNITY): Payer: Medicare HMO

## 2017-08-20 ENCOUNTER — Telehealth (HOSPITAL_COMMUNITY): Payer: Self-pay

## 2017-08-20 ENCOUNTER — Encounter (HOSPITAL_COMMUNITY): Payer: Medicare HMO

## 2017-08-20 NOTE — Telephone Encounter (Signed)
Patient called and stated he will not be attending class today as he is having a hard time walking.

## 2017-08-23 ENCOUNTER — Encounter (HOSPITAL_COMMUNITY): Payer: Medicare HMO

## 2017-08-25 ENCOUNTER — Encounter (HOSPITAL_COMMUNITY): Payer: Medicare HMO

## 2017-08-27 ENCOUNTER — Encounter (HOSPITAL_COMMUNITY): Payer: Medicare HMO

## 2017-08-30 ENCOUNTER — Encounter (HOSPITAL_COMMUNITY): Payer: Medicare HMO

## 2017-08-30 ENCOUNTER — Encounter (HOSPITAL_COMMUNITY)
Admission: RE | Admit: 2017-08-30 | Payer: Medicare HMO | Source: Ambulatory Visit | Attending: Cardiology | Admitting: Cardiology

## 2017-09-01 ENCOUNTER — Encounter (HOSPITAL_COMMUNITY): Payer: Medicare HMO

## 2017-09-03 ENCOUNTER — Encounter (HOSPITAL_COMMUNITY): Payer: Medicare HMO

## 2017-09-06 ENCOUNTER — Encounter (HOSPITAL_COMMUNITY): Payer: Medicare HMO

## 2017-09-07 ENCOUNTER — Ambulatory Visit: Payer: Medicare HMO | Admitting: Pulmonary Disease

## 2017-09-07 ENCOUNTER — Encounter: Payer: Self-pay | Admitting: Pulmonary Disease

## 2017-09-07 ENCOUNTER — Telehealth: Payer: Self-pay | Admitting: Pulmonary Disease

## 2017-09-07 VITALS — BP 118/80 | HR 66 | Ht 71.5 in | Wt 237.0 lb

## 2017-09-07 DIAGNOSIS — R0600 Dyspnea, unspecified: Secondary | ICD-10-CM

## 2017-09-07 DIAGNOSIS — R400 Somnolence: Secondary | ICD-10-CM | POA: Diagnosis not present

## 2017-09-07 DIAGNOSIS — G4733 Obstructive sleep apnea (adult) (pediatric): Secondary | ICD-10-CM | POA: Diagnosis not present

## 2017-09-07 DIAGNOSIS — R918 Other nonspecific abnormal finding of lung field: Secondary | ICD-10-CM
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
## 2017-09-07 NOTE — Telephone Encounter (Signed)
lmtcb for pt.  

## 2017-09-07 NOTE — Telephone Encounter (Signed)
Per AVS pt is to follow up in 3-4 weeks.  Pt does not wish to see a NP, only wishes to see BQ.  BQ is booked through June, July schedule is not yet laid.    BQ please advise if pt can be worked in to schedule.

## 2017-09-07 NOTE — Telephone Encounter (Signed)
If an opening comes up he can have it.  July should be out soon.

## 2017-09-07 NOTE — Patient Instructions (Signed)
Deconditioning: I think you need to exercise more frequently  Likely emphysema: For now hold off on any inhaled medicines until we have seen the results of the CT scan of your chest  Shortness of breath with abnormal lung exam: I think you need to have a high-resolution CT scan of the chest in order to assess for interstitial lung disease or scarring in the lungs  Daytime sleepiness and fatigue: Home sleep study tonight  Follow-up in 3 to 4 weeks or sooner if necessary

## 2017-09-08 ENCOUNTER — Encounter (HOSPITAL_COMMUNITY): Payer: Medicare HMO

## 2017-09-08 DIAGNOSIS — G4733 Obstructive sleep apnea (adult) (pediatric): Secondary | ICD-10-CM | POA: Diagnosis not present

## 2017-09-08 NOTE — Telephone Encounter (Signed)
Attempted to call pt. I did not receive an answer. I have left a message for pt to return our call.  

## 2017-09-09 DIAGNOSIS — R69 Illness, unspecified: Secondary | ICD-10-CM | POA: Diagnosis not present

## 2017-09-09 DIAGNOSIS — F330 Major depressive disorder, recurrent, mild: Secondary | ICD-10-CM | POA: Diagnosis not present

## 2017-09-09 NOTE — Progress Notes (Signed)
Discharge Progress Report  Patient Details  Name: WILBURT MESSINA MRN: [id number] Date of Birth: 12/06/1945 Referring Provider:     [facility] from 07/22/2017 in [place]  Referring Provider  Candee Furbish MD       Number of Visits: 5  Reason for Discharge:  Early Exit:  Lack of attendance  Smoking History:  Social History   Tobacco Use  Smoking Status Former Smoker  . Packs/day: 2.00  . Years: 40.00  . Pack years: 80.00  . Last attempt to quit: 08/26/2011  . Years since quitting: 6.0  Smokeless Tobacco Never Used  Tobacco Comment   quit 08-2011     Diagnosis:  Status post coronary artery stent placement  ADL UCSD:   Initial Exercise Prescription: Initial Exercise Prescription - 07/22/17 1500      Date of Initial Exercise RX and Referring Provider   Date  07/22/17    Referring Provider  Candee Furbish MD      Recumbant Bike   Level  2    Minutes  10    METs  1.7      NuStep   Level  2    SPM  80    Minutes  10    METs  2      Arm Ergometer   Level  2    Minutes  10    METs  1.5      Prescription Details   Frequency (times per week)  3    Duration  Progress to 30 minutes of continuous aerobic without signs/symptoms of physical distress      Intensity   THRR 40-80% of Max Heartrate  59-118    Ratings of Perceived Exertion  11-13    Perceived Dyspnea  0-4      Progression   Progression  Continue to progress workloads to maintain intensity without signs/symptoms of physical distress.      Resistance Training   Training Prescription  Yes    Weight  2lbs    Reps  10-15       Discharge Exercise Prescription (Final Exercise Prescription Changes): Exercise Prescription Changes - 08/16/17 1455      Response to Exercise   Blood Pressure (Admit)  124/78    Blood Pressure (Exercise)  124/80    Blood Pressure (Exit)  104/78    Heart Rate (Admit)  82 bpm    Heart Rate (Exercise)  100 bpm    Heart Rate (Exit)  81 bpm    Rating of Perceived Exertion (Exercise)  12    Perceived Dyspnea (Exercise)  0    Symptoms  None    Duration  Continue with 30 min of aerobic exercise without signs/symptoms of physical distress.    Intensity  THRR unchanged      Progression   Progression  Continue to progress workloads to maintain intensity without signs/symptoms of physical distress.    Average METs  2      Resistance Training   Training Prescription  Yes    Weight  3lbs    Reps  10-15    Time  10 Minutes      Interval Training   Interval Training  No      Recumbant Bike   Level  2    Minutes  10      NuStep   Level  3    SPM  85    Minutes  10  METs  2.3      Arm Ergometer   Level  2    Minutes  10    METs  1.75       Functional Capacity: 6 Minute Walk    Row Name 07/22/17 1546         6 Minute Walk   Phase  Initial     Distance  1373 feet     Walk Time  6 minutes     # of Rest Breaks  0     MPH  2.6     METS  2.5     RPE  13     Perceived Dyspnea   1     VO2 Peak  8.92     Symptoms  Yes (comment)     Comments  mild SOB     Resting HR  64 bpm     Resting BP  108/70     Resting Oxygen Saturation   94 %     Exercise Oxygen Saturation  during 6 min walk  95 %     Max Ex. HR  100 bpm     Max Ex. BP  114/74     2 Minute Post BP  112/74        Psychological, QOL, Others - Outcomes: PHQ 2/9: Depression screen [facility] 2/9 07/26/2017 10/20/2016 02/04/2016 08/05/2015 04/05/2015  Decreased Interest 3 0 0 0 1  Down, Depressed, Hopeless 3 0 0 0 1  PHQ - 2 Score 6 0 0 0 2  Altered sleeping 3 - - - 1  Tired, decreased energy 3 - - - 1  Change in appetite 3 - - - 1  Feeling bad or failure about yourself  1 - - - 1  Trouble concentrating 0 - - - 1  Moving slowly or fidgety/restless 0 - - - 0  Suicidal thoughts 0 - - - 0  PHQ-9 Score 16 - - - 7  Difficult doing work/chores Very difficult - - - Somewhat difficult    Quality of Life: Quality of Life - 07/22/17  1521      Quality of Life Scores   Health/Function Pre  9.07 %    Socioeconomic Pre  18 %    Psych/Spiritual Pre  11.86 %    Family Pre  22.8 %    GLOBAL Pre  13.5 %       Personal Goals: Goals established at orientation with interventions provided to work toward goal. Personal Goals and Risk Factors at Admission - 07/22/17 1555      Core Components/Risk Factors/Patient Goals on Admission   Admit Weight  234 lb 12.6 oz (106.5 kg)    Goal Weight: Short Term  224 lb (101.6 kg)    Goal Weight: Long Term  214 lb (97.1 kg)        Personal Goals Discharge: Goals and Risk Factor Review    Row Name 07/26/17 1639 08/11/17 1634           Core Components/Risk Factors/Patient Goals Review   Personal Goals Review  Weight Management/Obesity;Improve shortness of breath with ADL's;Diabetes;Hypertension;Lipids  Weight Management/Obesity;Improve shortness of breath with ADL's;Diabetes;Hypertension;Lipids      Review  pt with multiple CAD RF demonstrates eagerness to participate in CR activities. pt goals for rehab are to increase strength/stamina.    Pt with multiple CAD RF with consecutive absences.  He has missed six sessions.       Expected Outcomes  pt will participate  in CR exercise, nutrition and lifestyle modification activities.   Pt will participate in CR exercise, nutrition, and lifestyle modification opportunities with new exercise time.          Exercise Goals and Review: Exercise Goals    Row Name 07/22/17 1432             Exercise Goals   Increase Physical Activity  Yes       Intervention  Develop an individualized exercise prescription for aerobic and resistive training based on initial evaluation findings, risk stratification, comorbidities and participant's personal goals.;Provide advice, education, support and counseling about physical activity/exercise needs.       Expected Outcomes  Short Term: Attend rehab on a regular basis to increase amount of physical  activity.;Long Term: Add in home exercise to make exercise part of routine and to increase amount of physical activity.;Long Term: Exercising regularly at least 3-5 days a week.       Increase Strength and Stamina  Yes improve functional mobility, Be able to climb stairs without SOB/fatigue. Improve balance/stability.       Intervention  Provide advice, education, support and counseling about physical activity/exercise needs.;Develop an individualized exercise prescription for aerobic and resistive training based on initial evaluation findings, risk stratification, comorbidities and participant's personal goals.       Expected Outcomes  Short Term: Increase workloads from initial exercise prescription for resistance, speed, and METs.;Short Term: Perform resistance training exercises routinely during rehab and add in resistance training at home;Long Term: Improve cardiorespiratory fitness, muscular endurance and strength as measured by increased METs and functional capacity (6MWT)       Able to understand and use rate of perceived exertion (RPE) scale  Yes       Intervention  Provide education and explanation on how to use RPE scale       Expected Outcomes  Short Term: Able to use RPE daily in rehab to express subjective intensity level;Long Term:  Able to use RPE to guide intensity level when exercising independently       Knowledge and understanding of Target Heart Rate Range (THRR)  Yes       Intervention  Provide education and explanation of THRR including how the numbers were predicted and where they are located for reference       Expected Outcomes  Long Term: Able to use THRR to govern intensity when exercising independently;Short Term: Able to use daily as guideline for intensity in rehab;Short Term: Able to state/look up THRR       Able to check pulse independently  Yes       Intervention  Provide education and demonstration on how to check pulse in carotid and radial arteries.;Review the  importance of being able to check your own pulse for safety during independent exercise       Expected Outcomes  Short Term: Able to explain why pulse checking is important during independent exercise;Long Term: Able to check pulse independently and accurately       Understanding of Exercise Prescription  Yes       Intervention  Provide education, explanation, and written materials on patient's individual exercise prescription       Expected Outcomes  Short Term: Able to explain program exercise prescription;Long Term: Able to explain home exercise prescription to exercise independently          Nutrition & Weight - Outcomes: Pre Biometrics - 07/22/17 1545      Pre Biometrics   % Body Fat  34.8 %        Nutrition: Nutrition Therapy & Goals - 07/23/17 0835      Nutrition Therapy   Diet  Carb Modified, Heart Healthy      Personal Nutrition Goals   Nutrition Goal  Pt to identify and limit food sources of saturated fat, trans fat, and sodium    Personal Goal #2  Pt to identify food quantities necessary to achieve weight loss of 6-24 lb (2.7-10.9 kg) at graduation from cardiac rehab.     Personal Goal #3  Improved blood glucose control as evidenced by pt's A1c trending toward less than 7.0.      Intervention Plan   Intervention  Prescribe, educate and counsel regarding individualized specific dietary modifications aiming towards targeted core components such as weight, hypertension, lipid management, diabetes, heart failure and other comorbidities.    Expected Outcomes  Short Term Goal: Understand basic principles of dietary content, such as calories, fat, sodium, cholesterol and nutrients.;Long Term Goal: Adherence to prescribed nutrition plan.       Nutrition Discharge:   Education Questionnaire Score: Knowledge Questionnaire Score - 07/22/17 1553      Knowledge Questionnaire Score   Pre Score  22/24       Pt with continued absences from Cardiac Rehab. Discharged from the  program.

## 2017-09-09 NOTE — Telephone Encounter (Signed)
Spoke with pt. He has been scheduled to see Dr. Lake Bells on 11/02/17 at 10am. Nothing further was needed.

## 2017-09-09 NOTE — Addendum Note (Signed)
Encounter addended by: Noel Christmas, RN on: 09/09/2017 4:25 PM  Actions taken: Episode resolved

## 2017-09-10 ENCOUNTER — Encounter (HOSPITAL_COMMUNITY): Payer: Medicare HMO

## 2017-09-13 ENCOUNTER — Encounter (HOSPITAL_COMMUNITY): Payer: Medicare HMO

## 2017-09-15 ENCOUNTER — Encounter (HOSPITAL_COMMUNITY): Payer: Medicare HMO

## 2017-09-15 ENCOUNTER — Other Ambulatory Visit: Payer: Self-pay | Admitting: *Deleted

## 2017-09-15 DIAGNOSIS — R400 Somnolence: Secondary | ICD-10-CM

## 2017-09-17 ENCOUNTER — Encounter (HOSPITAL_COMMUNITY): Payer: Medicare HMO

## 2017-09-22 ENCOUNTER — Encounter (HOSPITAL_COMMUNITY): Payer: Medicare HMO

## 2017-09-22 ENCOUNTER — Other Ambulatory Visit: Payer: Medicare HMO

## 2017-09-24 ENCOUNTER — Encounter (HOSPITAL_COMMUNITY): Payer: Medicare HMO

## 2017-09-27 ENCOUNTER — Encounter (HOSPITAL_COMMUNITY): Payer: Medicare HMO

## 2017-09-28 DIAGNOSIS — H524 Presbyopia: Secondary | ICD-10-CM | POA: Diagnosis not present

## 2017-09-28 DIAGNOSIS — E139 Other specified diabetes mellitus without complications: Secondary | ICD-10-CM | POA: Diagnosis not present

## 2017-09-28 DIAGNOSIS — H18413 Arcus senilis, bilateral: Secondary | ICD-10-CM | POA: Diagnosis not present

## 2017-09-28 DIAGNOSIS — H52223 Regular astigmatism, bilateral: Secondary | ICD-10-CM | POA: Diagnosis not present

## 2017-09-28 DIAGNOSIS — I10 Essential (primary) hypertension: Secondary | ICD-10-CM | POA: Diagnosis not present

## 2017-09-28 DIAGNOSIS — H5203 Hypermetropia, bilateral: Secondary | ICD-10-CM | POA: Diagnosis not present

## 2017-09-29 ENCOUNTER — Encounter (HOSPITAL_COMMUNITY): Payer: Medicare HMO

## 2017-09-29 ENCOUNTER — Ambulatory Visit (INDEPENDENT_AMBULATORY_CARE_PROVIDER_SITE_OTHER)
Admission: RE | Admit: 2017-09-29 | Discharge: 2017-09-29 | Disposition: A | Discharge status: Home / Self Care | Payer: Medicare HMO | Source: Ambulatory Visit | Attending: Pulmonary Disease | Admitting: Pulmonary Disease

## 2017-09-29 DIAGNOSIS — R918 Other nonspecific abnormal finding of lung field: Secondary | ICD-10-CM | POA: Diagnosis not present

## 2017-09-29 DIAGNOSIS — R0600 Dyspnea, unspecified: Secondary | ICD-10-CM | POA: Diagnosis not present

## 2017-09-30 DIAGNOSIS — H524 Presbyopia: Secondary | ICD-10-CM | POA: Diagnosis not present

## 2017-09-30 DIAGNOSIS — H52223 Regular astigmatism, bilateral: Secondary | ICD-10-CM | POA: Diagnosis not present

## 2017-10-01 ENCOUNTER — Encounter (HOSPITAL_COMMUNITY): Payer: Medicare HMO

## 2017-10-04 ENCOUNTER — Encounter (HOSPITAL_COMMUNITY): Payer: Medicare HMO

## 2017-10-05 ENCOUNTER — Ambulatory Visit: Payer: Medicare HMO | Admitting: Cardiology

## 2017-10-05 ENCOUNTER — Encounter: Payer: Self-pay | Admitting: Cardiology

## 2017-10-05 VITALS — BP 154/82 | HR 64 | Ht 71.5 in | Wt 240.6 lb

## 2017-10-05 DIAGNOSIS — I10 Essential (primary) hypertension: Secondary | ICD-10-CM | POA: Diagnosis not present

## 2017-10-05 DIAGNOSIS — I259 Chronic ischemic heart disease, unspecified: Secondary | ICD-10-CM

## 2017-10-05 DIAGNOSIS — Z955 Presence of coronary angioplasty implant and graft: Secondary | ICD-10-CM | POA: Diagnosis not present

## 2017-10-05 DIAGNOSIS — E7849 Other hyperlipidemia: Secondary | ICD-10-CM | POA: Diagnosis not present

## 2017-10-05 NOTE — Patient Instructions (Signed)
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
## 2017-10-06 ENCOUNTER — Encounter (HOSPITAL_COMMUNITY): Payer: Medicare HMO

## 2017-10-08 ENCOUNTER — Encounter (HOSPITAL_COMMUNITY): Payer: Medicare HMO

## 2017-10-08 DIAGNOSIS — R69 Illness, unspecified: Secondary | ICD-10-CM | POA: Diagnosis not present

## 2017-10-08 DIAGNOSIS — F3341 Major depressive disorder, recurrent, in partial remission: Secondary | ICD-10-CM | POA: Diagnosis not present

## 2017-10-11 ENCOUNTER — Encounter (HOSPITAL_COMMUNITY): Payer: Medicare HMO

## 2017-10-13 ENCOUNTER — Encounter (HOSPITAL_COMMUNITY): Payer: Medicare HMO
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
## 2017-10-15 ENCOUNTER — Encounter (HOSPITAL_COMMUNITY): Payer: Medicare HMO

## 2017-10-15 DIAGNOSIS — R69 Illness, unspecified: Secondary | ICD-10-CM | POA: Diagnosis not present

## 2017-10-18 ENCOUNTER — Encounter (HOSPITAL_COMMUNITY): Payer: Medicare HMO

## 2017-10-20 ENCOUNTER — Encounter (HOSPITAL_COMMUNITY): Payer: Medicare HMO

## 2017-10-21 ENCOUNTER — Ambulatory Visit (INDEPENDENT_AMBULATORY_CARE_PROVIDER_SITE_OTHER): Payer: Medicare HMO | Admitting: *Deleted

## 2017-10-21 ENCOUNTER — Encounter: Payer: Self-pay | Admitting: Internal Medicine

## 2017-10-21 ENCOUNTER — Ambulatory Visit (INDEPENDENT_AMBULATORY_CARE_PROVIDER_SITE_OTHER): Payer: Medicare HMO | Admitting: Internal Medicine

## 2017-10-21 ENCOUNTER — Encounter: Payer: Self-pay | Admitting: *Deleted

## 2017-10-21 VITALS — BP 140/82 | HR 60 | Ht 72.0 in | Wt 233.6 lb

## 2017-10-21 VITALS — BP 140/82 | HR 60 | Ht 72.0 in | Wt 233.0 lb

## 2017-10-21 DIAGNOSIS — E119 Type 2 diabetes mellitus without complications: Secondary | ICD-10-CM | POA: Diagnosis not present

## 2017-10-21 DIAGNOSIS — Z0000 Encounter for general adult medical examination without abnormal findings: Secondary | ICD-10-CM | POA: Diagnosis not present

## 2017-10-21 DIAGNOSIS — R351 Nocturia: Secondary | ICD-10-CM

## 2017-10-21 LAB — BASIC METABOLIC PANEL
BUN: 22 mg/dL (ref 6–23)
CHLORIDE: 107 meq/L (ref 96–112)
CO2: 23 mEq/L (ref 19–32)
Calcium: 9.4 mg/dL (ref 8.4–10.5)
Creatinine, Ser: 0.86 mg/dL (ref 0.40–1.50)
GFR: 92.6 mL/min (ref >60.00)
Glucose, Bld: 126 mg/dL — ABNORMAL HIGH (ref 70–99)
POTASSIUM: 4.3 meq/L (ref 3.5–5.1)
SODIUM: 138 meq/L (ref 135–145)

## 2017-10-21 LAB — URINALYSIS, ROUTINE W REFLEX MICROSCOPIC
BILIRUBIN URINE: NEGATIVE
HGB URINE DIPSTICK: NEGATIVE
KETONES UR: NEGATIVE
Leukocytes, UA: NEGATIVE
NITRITE: NEGATIVE
Specific Gravity, Urine: >=1.03 — AB (ref 1.000–1.030)
Total Protein, Urine: NEGATIVE
UROBILINOGEN UA: 0.2 (ref 0.0–1.0)
Urine Glucose: NEGATIVE
pH: 5.5 (ref 5.0–8.0)

## 2017-10-21 LAB — PSA: PSA: 4.37 ng/mL — AB (ref 0.10–4.00)

## 2017-10-21 LAB — HEMOGLOBIN A1C: Hgb A1c MFr Bld: 7.7 % — ABNORMAL HIGH (ref 4.6–6.5)

## 2017-10-21 NOTE — Assessment & Plan Note (Addendum)
--  Td 2018 ; pneumonia shot 06-09-10; prevnar 10-15; Shingles not available  --CCS: cscope 2016, repeat 2021 --Prostate cancer screening: Has significant nocturia, DRE with mild increase in prostate size, check a PSA, UA, urine culture.  Needs to see urology, states he will call. --Lung cancer screening: had a HRCT 09/2017 --diet, exercise: Discussed --labs:   BMP, A1c, PSA, UA, urine culture

## 2017-10-21 NOTE — Progress Notes (Signed)
Pre visit review using our clinic review tool, if applicable. No additional management support is needed unless otherwise documented below in the visit note. 
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
## 2017-10-21 NOTE — Patient Instructions (Addendum)
Follow up with Dr.[name] today as scheduled  Please schedule your next medicare wellness visit with me in 1 yr.  Eat heart healthy diet (full of fruits, vegetables, whole grains, lean protein, water--limit salt, fat, and sugar intake) and increase physical activity as tolerated.  Continue doing brain stimulating activities (puzzles, reading, adult coloring books, staying active) to keep memory sharp.   Bring a copy of your living will and/or healthcare power of attorney to your next office visit.   [name] , Thank you for taking time to come for your Medicare Wellness Visit. I appreciate your ongoing commitment to your health goals. Please review the following plan we discussed and let me know if I can assist you in the future.   These are the goals we discussed: Goals    . DIET - EAT MORE FRUITS AND VEGETABLES    . DIET - INCREASE WATER INTAKE    . Increase physical activity       This is a list of the screening recommended for you and due dates:  Health Maintenance  Topic Date Due  . Eye exam for diabetics  08/15/1954  . Hemoglobin A1C  10/11/2017  . Flu Shot  11/25/2017  . Complete foot exam   01/22/2018  . Colon Cancer Screening  09/14/2019  . Tetanus Vaccine  10/21/2026  .  Hepatitis C: One time screening is recommended by Center for Disease Control  (CDC) for  adults born from [birth year] through 1966.   Completed  . Pneumonia vaccines  Completed    Health Maintenance, Male A healthy lifestyle and preventive care is important for your health and wellness. Ask your health care provider about what schedule of regular examinations is right for you. What should I know about weight and diet? Eat a Healthy Diet  Eat plenty of vegetables, fruits, whole grains, low-fat dairy products, and lean protein.  Do not eat a lot of foods high in solid fats, added sugars, or salt.  Maintain a Healthy Weight Regular exercise can help you achieve or maintain a healthy weight. You should:  Do  at least 150 minutes of exercise each week. The exercise should increase your heart rate and make you sweat (moderate-intensity exercise).  Do strength-training exercises at least twice a week.  Watch Your Levels of Cholesterol and Blood Lipids  Have your blood tested for lipids and cholesterol every 5 years starting at 73 years of age. If you are at high risk for heart disease, you should start having your blood tested when you are 73 years old. You may need to have your cholesterol levels checked more often if: ? Your lipid or cholesterol levels are high. ? You are older than 73 years of age. ? You are at high risk for heart disease.  What should I know about cancer screening? Many types of cancers can be detected early and may often be prevented. Lung Cancer  You should be screened every year for lung cancer if: ? You are a current smoker who has smoked for at least 30 years. ? You are a former smoker who has quit within the past 15 years.  Talk to your health care provider about your screening options, when you should start screening, and how often you should be screened.  Colorectal Cancer  Routine colorectal cancer screening usually begins at 73 years of age and should be repeated every 5-10 years until you are 73 years old. You may need to be screened more often if early  forms of precancerous polyps or small growths are found. Your health care provider may recommend screening at an earlier age if you have risk factors for colon cancer.  Your health care provider may recommend using home test kits to check for hidden blood in the stool.  A small camera at the end of a tube can be used to examine your colon (sigmoidoscopy or colonoscopy). This checks for the earliest forms of colorectal cancer.  Prostate and Testicular Cancer  Depending on your age and overall health, your health care provider may do certain tests to screen for prostate and testicular cancer.  Talk to your  health care provider about any symptoms or concerns you have about testicular or prostate cancer.  Skin Cancer  Check your skin from head to toe regularly.  Tell your health care provider about any new moles or changes in moles, especially if: ? There is a change in a mole's size, shape, or color. ? You have a mole that is larger than a pencil eraser.  Always use sunscreen. Apply sunscreen liberally and repeat throughout the day.  Protect yourself by wearing long sleeves, pants, a wide-brimmed hat, and sunglasses when outside.  What should I know about heart disease, diabetes, and high blood pressure?  If you are 66-22 years of age, have your blood pressure checked every 3-5 years. If you are 78 years of age or older, have your blood pressure checked every year. You should have your blood pressure measured twice-once when you are at a hospital or clinic, and once when you are not at a hospital or clinic. Record the average of the two measurements. To check your blood pressure when you are not at a hospital or clinic, you can use: ? An automated blood pressure machine at a pharmacy. ? A home blood pressure monitor.  Talk to your health care provider about your target blood pressure.  If you are between 51-67 years old, ask your health care provider if you should take aspirin to prevent heart disease.  Have regular diabetes screenings by checking your fasting blood sugar level. ? If you are at a normal weight and have a low risk for diabetes, have this test once every three years after the age of 56. ? If you are overweight and have a high risk for diabetes, consider being tested at a younger age or more often.  A one-time screening for abdominal aortic aneurysm (AAA) by ultrasound is recommended for men aged 30-75 years who are current or former smokers. What should I know about preventing infection? Hepatitis B If you have a higher risk for hepatitis B, you should be screened for this  virus. Talk with your health care provider to find out if you are at risk for hepatitis B infection. Hepatitis C Blood testing is recommended for:  Everyone born from [birth year] through 1966.  Anyone with known risk factors for hepatitis C.  Sexually Transmitted Diseases (STDs)  You should be screened each year for STDs including gonorrhea and chlamydia if: ? You are sexually active and are younger than 73 years of age. ? You are older than 73 years of age and your health care provider tells you that you are at risk for this type of infection. ? Your sexual activity has changed since you were last screened and you are at an increased risk for chlamydia or gonorrhea. Ask your health care provider if you are at risk.  Talk with your health care provider about whether you  are at high risk of being infected with HIV. Your health care provider may recommend a prescription medicine to help prevent HIV infection.  What else can I do?  Schedule regular health, dental, and eye exams.  Stay current with your vaccines (immunizations).  Do not use any tobacco products, such as cigarettes, chewing tobacco, and e-cigarettes. If you need help quitting, ask your health care provider.  Limit alcohol intake to no more than 2 drinks per day. One drink equals 12 ounces of beer, 5 ounces of wine, or 1 ounces of hard liquor.  Do not use street drugs.  Do not share needles.  Ask your health care provider for help if you need support or information about quitting drugs.  Tell your health care provider if you often feel depressed.  Tell your health care provider if you have ever been abused or do not feel safe at home. This information is not intended to replace advice given to you by your health care provider. Make sure you discuss any questions you have with your health care provider. Document Released: 10/10/2007 Document Revised: 12/11/2015 Document Reviewed: 01/15/2015 Elsevier Interactive Patient  Education  [employer].

## 2017-10-22 ENCOUNTER — Encounter (HOSPITAL_COMMUNITY): Payer: Medicare HMO

## 2017-10-22 LAB — URINE CULTURE
MICRO NUMBER: 90768941
Result:: NO GROWTH
SPECIMEN QUALITY: ADEQUATE

## 2017-10-22 NOTE — Assessment & Plan Note (Signed)
DM: Last A1c 7.0, currently on diet controlled, check A1c HTN: Seems controlled on losartan 25 mg, metoprolol.  No change Depression anxiety: Managed elsewhere, Wellbutrin stopped, started Rexulti and continue Lexapro, feeling much improved. CAD: Stable per last cardiology note, no chest pain "Not feeling well". See previous note, w/u neg except for a + ANA.  Today he feels much improved with decreased aches and pains and no "flulike" symptoms.  Improvement could be from better control of depression/anxiety, he is also taking Celebrex.  Has not seen rheumatology for + ANA and does not like to proceed.  Explained the implications of + ANA.  We will continue with Celebrex for now, GI precautions discussed, checking a BMP today LUTS: see CPX comments Pulmonary: Data reviewed, PFTs, CT, sleep study + for sleep apnea, follow-up by pulmonary. RTC 4 months.

## 2017-10-25 ENCOUNTER — Encounter (HOSPITAL_COMMUNITY): Payer: Medicare HMO

## 2017-10-27 ENCOUNTER — Encounter (HOSPITAL_COMMUNITY): Payer: Medicare HMO

## 2017-11-02 ENCOUNTER — Ambulatory Visit: Payer: Medicare HMO | Admitting: Primary Care

## 2017-11-02 ENCOUNTER — Encounter: Payer: Self-pay | Admitting: Primary Care

## 2017-11-02 ENCOUNTER — Ambulatory Visit: Payer: Medicare HMO | Admitting: Pulmonary Disease

## 2017-11-02 VITALS — BP 142/80 | HR 66 | Ht 72.0 in | Wt 240.0 lb

## 2017-11-02 DIAGNOSIS — G4733 Obstructive sleep apnea (adult) (pediatric): Secondary | ICD-10-CM

## 2017-11-02 DIAGNOSIS — R9389 Abnormal findings on diagnostic imaging of other specified body structures: Secondary | ICD-10-CM

## 2017-11-02 DIAGNOSIS — T17908A Unspecified foreign body in respiratory tract, part unspecified causing other injury, initial encounter: Secondary | ICD-10-CM

## 2017-11-02 DIAGNOSIS — Z9189 Other specified personal risk factors, not elsewhere classified: Secondary | ICD-10-CM

## 2017-11-02 DIAGNOSIS — K219 Gastro-esophageal reflux disease without esophagitis: Secondary | ICD-10-CM

## 2017-11-02 NOTE — Assessment & Plan Note (Addendum)
-   New; HST completed 09/07/17, AHI 28.7. Oxygen desat to nadir 83% with average of 90% - Plan for auto titrate CPAP 5-15cm H20 with full facemask.  - Goal 100% compliance, >4 hrs a night  - Download in 1 month. FU in 30-90 days. Consider Ono after to check SaO2

## 2017-11-02 NOTE — Assessment & Plan Note (Signed)
HRCT 09/29/17- Peribronchovascular ground-glass in left upper and lower lung field. No evidence of ILD.  Denies fluid vol symptoms, hemoptysis, recent PNA/bronchitis. At risk for aspiration. Plan speech therapy for swallow eval, if negative would consider ILD panel (+ANA has not seen rheum)

## 2017-11-02 NOTE — Assessment & Plan Note (Signed)
-  Patient acknowledges that theres a good chance he is aspirating food while eating. States that he eats a large amt of food quickly  -Plan speech therapy for swallow eval  -Encourage patient to eat slowly and take smaller bites
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
## 2017-11-02 NOTE — Assessment & Plan Note (Signed)
-   Continues PPI - States that he eats late at night which increases his reflux - Reviewed GERD diet (avoid triggers, eating late at night and elevate HOB)
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
## 2017-11-02 NOTE — Progress Notes (Signed)
Reviewed, agree 

## 2017-11-02 NOTE — Patient Instructions (Signed)
CPAP machine at bedtime, goal 100% compliance >4 hours each night Download in 1 month please  Speech therapy for swallow eval   Slow down when you eat, take smaller bites  Work on weight loss and increasing physical exercise (use those silver sneakers!!)  FU in 2-3 months to review sleep study Dr. Lake Bells or NP

## 2017-11-03 NOTE — Progress Notes (Signed)
Would recheck HRCT at next visit in 2-3 months

## 2017-11-10 DIAGNOSIS — Z96649 Presence of unspecified artificial hip joint: Secondary | ICD-10-CM | POA: Diagnosis not present

## 2017-11-10 DIAGNOSIS — G4733 Obstructive sleep apnea (adult) (pediatric): Secondary | ICD-10-CM | POA: Diagnosis not present

## 2017-11-12 DIAGNOSIS — F411 Generalized anxiety disorder: Secondary | ICD-10-CM | POA: Diagnosis not present

## 2017-11-12 DIAGNOSIS — R69 Illness, unspecified: Secondary | ICD-10-CM | POA: Diagnosis not present

## 2017-12-11 DIAGNOSIS — G4733 Obstructive sleep apnea (adult) (pediatric): Secondary | ICD-10-CM | POA: Diagnosis not present

## 2017-12-11 DIAGNOSIS — Z96649 Presence of unspecified artificial hip joint: Secondary | ICD-10-CM | POA: Diagnosis not present

## 2017-12-16 DIAGNOSIS — F419 Anxiety disorder, unspecified: Secondary | ICD-10-CM | POA: Diagnosis not present

## 2017-12-16 DIAGNOSIS — R69 Illness, unspecified: Secondary | ICD-10-CM | POA: Diagnosis not present

## 2017-12-16 DIAGNOSIS — I10 Essential (primary) hypertension: Secondary | ICD-10-CM | POA: Diagnosis not present

## 2017-12-16 DIAGNOSIS — M1990 Unspecified osteoarthritis, unspecified site: Secondary | ICD-10-CM | POA: Diagnosis not present

## 2017-12-16 DIAGNOSIS — K219 Gastro-esophageal reflux disease without esophagitis: Secondary | ICD-10-CM | POA: Diagnosis not present

## 2017-12-16 DIAGNOSIS — E785 Hyperlipidemia, unspecified: Secondary | ICD-10-CM | POA: Diagnosis not present

## 2017-12-16 DIAGNOSIS — G4730 Sleep apnea, unspecified: Secondary | ICD-10-CM | POA: Diagnosis not present

## 2017-12-16 DIAGNOSIS — G8929 Other chronic pain: Secondary | ICD-10-CM | POA: Diagnosis not present

## 2017-12-16 DIAGNOSIS — I2510 Atherosclerotic heart disease of native coronary artery without angina pectoris: Secondary | ICD-10-CM | POA: Diagnosis not present

## 2018-01-19 DIAGNOSIS — R69 Illness, unspecified: Secondary | ICD-10-CM | POA: Diagnosis not present

## 2018-01-19 DIAGNOSIS — F411 Generalized anxiety disorder: Secondary | ICD-10-CM | POA: Diagnosis not present

## 2018-01-31 DIAGNOSIS — R69 Illness, unspecified: Secondary | ICD-10-CM | POA: Diagnosis not present

## 2018-02-04 ENCOUNTER — Other Ambulatory Visit: Payer: Self-pay | Admitting: Internal Medicine

## 2018-02-07 MED ORDER — LOSARTAN POTASSIUM 50 MG PO TABS: 25.0000 mg | ORAL_TABLET | Freq: Every day | ORAL | 1 refills | Status: DC

## 2018-02-07 MED ORDER — CELECOXIB 50 MG PO CAPS: 50.0000 mg | ORAL_CAPSULE | Freq: Every day | ORAL | 0 refills | Status: DC | PRN

## 2018-02-08 DIAGNOSIS — K029 Dental caries, unspecified: Secondary | ICD-10-CM | POA: Diagnosis not present

## 2018-02-17 DIAGNOSIS — R351 Nocturia: Secondary | ICD-10-CM | POA: Diagnosis not present

## 2018-02-17 DIAGNOSIS — N401 Enlarged prostate with lower urinary tract symptoms: Secondary | ICD-10-CM | POA: Diagnosis not present

## 2018-02-17 DIAGNOSIS — N402 Nodular prostate without lower urinary tract symptoms: Secondary | ICD-10-CM | POA: Diagnosis not present

## 2018-02-17 DIAGNOSIS — R972 Elevated prostate specific antigen [PSA]: Secondary | ICD-10-CM | POA: Diagnosis not present

## 2018-02-17 DIAGNOSIS — N281 Cyst of kidney, acquired: Secondary | ICD-10-CM | POA: Diagnosis not present

## 2018-02-25 DIAGNOSIS — R69 Illness, unspecified: Secondary | ICD-10-CM | POA: Diagnosis not present

## 2018-02-25 DIAGNOSIS — F411 Generalized anxiety disorder: Secondary | ICD-10-CM | POA: Diagnosis not present

## 2018-03-03 DIAGNOSIS — N401 Enlarged prostate with lower urinary tract symptoms: Secondary | ICD-10-CM | POA: Diagnosis not present

## 2018-03-03 DIAGNOSIS — R3915 Urgency of urination: Secondary | ICD-10-CM | POA: Diagnosis not present

## 2018-03-03 DIAGNOSIS — R972 Elevated prostate specific antigen [PSA]: Secondary | ICD-10-CM | POA: Diagnosis not present

## 2018-03-07 ENCOUNTER — Other Ambulatory Visit: Payer: Self-pay | Admitting: Cardiology

## 2018-03-07 ENCOUNTER — Other Ambulatory Visit: Payer: Self-pay | Admitting: Nurse Practitioner

## 2018-04-09 DIAGNOSIS — J40 Bronchitis, not specified as acute or chronic: Secondary | ICD-10-CM | POA: Diagnosis not present

## 2018-04-12 ENCOUNTER — Other Ambulatory Visit: Payer: Self-pay

## 2018-04-12 DIAGNOSIS — Z9189 Other specified personal risk factors, not elsewhere classified: Secondary | ICD-10-CM

## 2018-04-29 DIAGNOSIS — F3342 Major depressive disorder, recurrent, in full remission: Secondary | ICD-10-CM | POA: Diagnosis not present

## 2018-04-29 DIAGNOSIS — R69 Illness, unspecified: Secondary | ICD-10-CM | POA: Diagnosis not present

## 2018-05-06 DIAGNOSIS — R69 Illness, unspecified: Secondary | ICD-10-CM | POA: Diagnosis not present

## 2018-05-11 ENCOUNTER — Other Ambulatory Visit: Payer: Self-pay | Admitting: Cardiology

## 2018-05-11 ENCOUNTER — Other Ambulatory Visit: Payer: Self-pay | Admitting: Internal Medicine

## 2018-05-12 DIAGNOSIS — K219 Gastro-esophageal reflux disease without esophagitis: Secondary | ICD-10-CM | POA: Diagnosis not present

## 2018-05-12 DIAGNOSIS — R69 Illness, unspecified: Secondary | ICD-10-CM | POA: Diagnosis not present

## 2018-05-12 DIAGNOSIS — G4730 Sleep apnea, unspecified: Secondary | ICD-10-CM | POA: Diagnosis not present

## 2018-05-12 DIAGNOSIS — G8194 Hemiplegia, unspecified affecting left nondominant side: Secondary | ICD-10-CM | POA: Diagnosis not present

## 2018-05-12 DIAGNOSIS — G629 Polyneuropathy, unspecified: Secondary | ICD-10-CM | POA: Diagnosis not present

## 2018-05-12 DIAGNOSIS — G8929 Other chronic pain: Secondary | ICD-10-CM | POA: Diagnosis not present

## 2018-05-12 DIAGNOSIS — E785 Hyperlipidemia, unspecified: Secondary | ICD-10-CM | POA: Diagnosis not present

## 2018-05-12 DIAGNOSIS — I10 Essential (primary) hypertension: Secondary | ICD-10-CM | POA: Diagnosis not present

## 2018-05-12 DIAGNOSIS — I2510 Atherosclerotic heart disease of native coronary artery without angina pectoris: Secondary | ICD-10-CM | POA: Diagnosis not present

## 2018-05-22 ENCOUNTER — Emergency Department (HOSPITAL_COMMUNITY): Payer: Medicare HMO

## 2018-05-22 ENCOUNTER — Other Ambulatory Visit: Payer: Self-pay

## 2018-05-22 ENCOUNTER — Encounter (HOSPITAL_COMMUNITY): Payer: Self-pay | Admitting: Emergency Medicine

## 2018-05-22 ENCOUNTER — Inpatient Hospital Stay (HOSPITAL_COMMUNITY)
Admission: EM | Admit: 2018-05-22 | Discharge: 2018-05-25 | DRG: 871 | Disposition: A | Discharge status: Home / Self Care | Payer: Medicare HMO | Attending: Nephrology | Admitting: Nephrology

## 2018-05-22 DIAGNOSIS — Z7902 Long term (current) use of antithrombotics/antiplatelets: Secondary | ICD-10-CM | POA: Diagnosis not present

## 2018-05-22 DIAGNOSIS — R918 Other nonspecific abnormal finding of lung field: Secondary | ICD-10-CM | POA: Diagnosis not present

## 2018-05-22 DIAGNOSIS — Z96641 Presence of right artificial hip joint: Secondary | ICD-10-CM | POA: Diagnosis present

## 2018-05-22 DIAGNOSIS — E1165 Type 2 diabetes mellitus with hyperglycemia: Secondary | ICD-10-CM | POA: Diagnosis present

## 2018-05-22 DIAGNOSIS — K2270 Barrett's esophagus without dysplasia: Secondary | ICD-10-CM | POA: Diagnosis present

## 2018-05-22 DIAGNOSIS — J690 Pneumonitis due to inhalation of food and vomit: Secondary | ICD-10-CM | POA: Diagnosis not present

## 2018-05-22 DIAGNOSIS — R1084 Generalized abdominal pain: Secondary | ICD-10-CM | POA: Diagnosis not present

## 2018-05-22 DIAGNOSIS — Z955 Presence of coronary angioplasty implant and graft: Secondary | ICD-10-CM | POA: Diagnosis not present

## 2018-05-22 DIAGNOSIS — I2510 Atherosclerotic heart disease of native coronary artery without angina pectoris: Secondary | ICD-10-CM | POA: Diagnosis present

## 2018-05-22 DIAGNOSIS — F321 Major depressive disorder, single episode, moderate: Secondary | ICD-10-CM | POA: Diagnosis present

## 2018-05-22 DIAGNOSIS — G4733 Obstructive sleep apnea (adult) (pediatric): Secondary | ICD-10-CM | POA: Diagnosis not present

## 2018-05-22 DIAGNOSIS — E872 Acidosis, unspecified: Secondary | ICD-10-CM

## 2018-05-22 DIAGNOSIS — A419 Sepsis, unspecified organism: Principal | ICD-10-CM | POA: Diagnosis present

## 2018-05-22 DIAGNOSIS — D649 Anemia, unspecified: Secondary | ICD-10-CM | POA: Diagnosis present

## 2018-05-22 DIAGNOSIS — E1159 Type 2 diabetes mellitus with other circulatory complications: Secondary | ICD-10-CM

## 2018-05-22 DIAGNOSIS — N281 Cyst of kidney, acquired: Secondary | ICD-10-CM | POA: Diagnosis not present

## 2018-05-22 DIAGNOSIS — K219 Gastro-esophageal reflux disease without esophagitis: Secondary | ICD-10-CM | POA: Diagnosis present

## 2018-05-22 DIAGNOSIS — E860 Dehydration: Secondary | ICD-10-CM | POA: Diagnosis not present

## 2018-05-22 DIAGNOSIS — F329 Major depressive disorder, single episode, unspecified: Secondary | ICD-10-CM | POA: Diagnosis present

## 2018-05-22 DIAGNOSIS — N401 Enlarged prostate with lower urinary tract symptoms: Secondary | ICD-10-CM | POA: Diagnosis present

## 2018-05-22 DIAGNOSIS — J9601 Acute respiratory failure with hypoxia: Secondary | ICD-10-CM | POA: Diagnosis present

## 2018-05-22 DIAGNOSIS — R748 Abnormal levels of other serum enzymes: Secondary | ICD-10-CM | POA: Diagnosis present

## 2018-05-22 DIAGNOSIS — I10 Essential (primary) hypertension: Secondary | ICD-10-CM | POA: Diagnosis present

## 2018-05-22 DIAGNOSIS — E785 Hyperlipidemia, unspecified: Secondary | ICD-10-CM | POA: Diagnosis not present

## 2018-05-22 DIAGNOSIS — R69 Illness, unspecified: Secondary | ICD-10-CM | POA: Diagnosis not present

## 2018-05-22 DIAGNOSIS — Z87891 Personal history of nicotine dependence: Secondary | ICD-10-CM | POA: Diagnosis not present

## 2018-05-22 DIAGNOSIS — R112 Nausea with vomiting, unspecified: Secondary | ICD-10-CM | POA: Diagnosis present

## 2018-05-22 DIAGNOSIS — F32A Depression, unspecified: Secondary | ICD-10-CM | POA: Diagnosis present

## 2018-05-22 DIAGNOSIS — F419 Anxiety disorder, unspecified: Secondary | ICD-10-CM | POA: Diagnosis present

## 2018-05-22 DIAGNOSIS — R000 Tachycardia, unspecified: Secondary | ICD-10-CM | POA: Diagnosis not present

## 2018-05-22 DIAGNOSIS — R0689 Other abnormalities of breathing: Secondary | ICD-10-CM | POA: Diagnosis not present

## 2018-05-22 DIAGNOSIS — Z7982 Long term (current) use of aspirin: Secondary | ICD-10-CM | POA: Diagnosis not present

## 2018-05-22 DIAGNOSIS — R0902 Hypoxemia: Secondary | ICD-10-CM | POA: Diagnosis not present

## 2018-05-22 DIAGNOSIS — Z96651 Presence of right artificial knee joint: Secondary | ICD-10-CM | POA: Diagnosis present

## 2018-05-22 DIAGNOSIS — E119 Type 2 diabetes mellitus without complications: Secondary | ICD-10-CM | POA: Diagnosis not present

## 2018-05-22 DIAGNOSIS — K760 Fatty (change of) liver, not elsewhere classified: Secondary | ICD-10-CM | POA: Diagnosis not present

## 2018-05-22 DIAGNOSIS — R52 Pain, unspecified: Secondary | ICD-10-CM | POA: Diagnosis not present

## 2018-05-22 DIAGNOSIS — R739 Hyperglycemia, unspecified: Secondary | ICD-10-CM | POA: Diagnosis not present

## 2018-05-22 LAB — MRSA PCR SCREENING: MRSA by PCR: NEGATIVE

## 2018-05-22 LAB — COMPREHENSIVE METABOLIC PANEL
ALT: 44 U/L (ref 0–44)
AST: 50 U/L — ABNORMAL HIGH (ref 15–41)
Albumin: 4.1 g/dL (ref 3.5–5.0)
Alkaline Phosphatase: 56 U/L (ref 38–126)
Anion gap: 12 (ref 5–15)
BUN: 23 mg/dL (ref 8–23)
CALCIUM: 8.9 mg/dL (ref 8.9–10.3)
CO2: 20 mmol/L — AB (ref 22–32)
Chloride: 104 mmol/L (ref 98–111)
Creatinine, Ser: 1.12 mg/dL (ref 0.61–1.24)
GFR calc non Af Amer: >60 mL/min (ref >60)
Glucose, Bld: 328 mg/dL — ABNORMAL HIGH (ref 70–99)
Potassium: 4.3 mmol/L (ref 3.5–5.1)
Sodium: 136 mmol/L (ref 135–145)
Total Bilirubin: 0.8 mg/dL (ref 0.3–1.2)
Total Protein: 7.7 g/dL (ref 6.5–8.1)

## 2018-05-22 LAB — LACTIC ACID, PLASMA
Lactic Acid, Venous: 4.2 mmol/L (ref 0.5–1.9)
Lactic Acid, Venous: 3.7 mmol/L (ref 0.5–1.9)
Lactic Acid, Venous: 3.6 mmol/L (ref 0.5–1.9)
Lactic Acid, Venous: 3.1 mmol/L (ref 0.5–1.9)

## 2018-05-22 LAB — LIPASE, BLOOD: Lipase: 63 U/L — ABNORMAL HIGH (ref 11–51)

## 2018-05-22 LAB — CBC WITH DIFFERENTIAL/PLATELET
ABS IMMATURE GRANULOCYTES: 0.03 10*3/uL (ref 0.00–0.07)
Basophils Absolute: 0 10*3/uL (ref 0.0–0.1)
Basophils Relative: 0 %
Eosinophils Absolute: 0.2 10*3/uL (ref 0.0–0.5)
Eosinophils Relative: 2 %
HCT: 40.4 % (ref 39.0–52.0)
Hemoglobin: 13 g/dL (ref 13.0–17.0)
Immature Granulocytes: 0 %
Lymphocytes Relative: 8 %
Lymphs Abs: 0.6 10*3/uL — ABNORMAL LOW (ref 0.7–4.0)
MCH: 28.8 pg (ref 26.0–34.0)
MCHC: 32.2 g/dL (ref 30.0–36.0)
MCV: 89.4 fL (ref 80.0–100.0)
MONO ABS: 0.3 10*3/uL (ref 0.1–1.0)
Monocytes Relative: 4 %
NEUTROS ABS: 5.9 10*3/uL (ref 1.7–7.7)
Neutrophils Relative %: 86 %
Platelets: 210 10*3/uL (ref 150–400)
RBC: 4.52 MIL/uL (ref 4.22–5.81)
RDW: 14.7 % (ref 11.5–15.5)
WBC: 7 10*3/uL (ref 4.0–10.5)
nRBC: 0 % (ref 0.0–0.2)

## 2018-05-22 LAB — URINALYSIS, ROUTINE W REFLEX MICROSCOPIC
BACTERIA UA: NONE SEEN
Bilirubin Urine: NEGATIVE
Glucose, UA: >=500 mg/dL — AB
HGB URINE DIPSTICK: NEGATIVE
Ketones, ur: 5 mg/dL — AB
Leukocytes, UA: NEGATIVE
Nitrite: NEGATIVE
Protein, ur: NEGATIVE mg/dL
Specific Gravity, Urine: 1.018 (ref 1.005–1.030)
pH: 5 (ref 5.0–8.0)

## 2018-05-22 LAB — INFLUENZA PANEL BY PCR (TYPE A & B)
Influenza A By PCR: NEGATIVE
Influenza B By PCR: NEGATIVE

## 2018-05-22 LAB — GLUCOSE, CAPILLARY
Glucose-Capillary: 172 mg/dL — ABNORMAL HIGH (ref 70–99)
Glucose-Capillary: 286 mg/dL — ABNORMAL HIGH (ref 70–99)

## 2018-05-22 LAB — TROPONIN I

## 2018-05-22 LAB — PROTIME-INR
INR: 1.04
Prothrombin Time: 13.5 seconds (ref 11.4–15.2)

## 2018-05-22 LAB — PROCALCITONIN: Procalcitonin: 4.61 ng/mL

## 2018-05-22 MED ORDER — SODIUM CHLORIDE 0.9% FLUSH
3.0000 mL | Freq: Once | INTRAVENOUS | Status: AC
  Administered 2018-05-22: 3 mL via INTRAVENOUS

## 2018-05-22 MED ORDER — TRAZODONE HCL 50 MG PO TABS
50.0000 mg | ORAL_TABLET | Freq: Once | ORAL | Status: AC
  Administered 2018-05-22: 50 mg via ORAL
  Filled 2018-05-22: qty 1

## 2018-05-22 MED ORDER — SODIUM CHLORIDE 0.9 % IV SOLN: 1.0000 g | Freq: Three times a day (TID) | INTRAVENOUS | Status: DC

## 2018-05-22 MED ORDER — SODIUM CHLORIDE (PF) 0.9 % IJ SOLN
INTRAMUSCULAR | Status: AC
  Filled 2018-05-22: qty 50

## 2018-05-22 MED ORDER — IOPAMIDOL (ISOVUE-300) INJECTION 61%
100.0000 mL | Freq: Once | INTRAVENOUS | Status: AC | PRN
  Administered 2018-05-22: 100 mL via INTRAVENOUS
  Administered 2018-05-22: 20:00:00 via INTRAVENOUS

## 2018-05-22 MED ORDER — METOPROLOL SUCCINATE ER 25 MG PO TB24
25.0000 mg | ORAL_TABLET | Freq: Every day | ORAL | Status: DC
  Administered 2018-05-22 – 2018-05-25 (×4): 25 mg via ORAL
  Filled 2018-05-22 (×4): qty 1

## 2018-05-22 MED ORDER — ALBUTEROL SULFATE (2.5 MG/3ML) 0.083% IN NEBU: 2.5000 mg | INHALATION_SOLUTION | Freq: Four times a day (QID) | RESPIRATORY_TRACT | Status: DC

## 2018-05-22 MED ORDER — ESCITALOPRAM OXALATE 20 MG PO TABS
40.0000 mg | ORAL_TABLET | Freq: Every day | ORAL | Status: DC
  Administered 2018-05-22 – 2018-05-25 (×4): 40 mg via ORAL
  Filled 2018-05-22 (×4): qty 2

## 2018-05-22 MED ORDER — BREXPIPRAZOLE 1 MG PO TABS
1.0000 mg | ORAL_TABLET | Freq: Every day | ORAL | Status: DC
  Administered 2018-05-22 – 2018-05-25 (×3): 1 mg via ORAL
  Filled 2018-05-22 (×4): qty 1

## 2018-05-22 MED ORDER — SODIUM CHLORIDE 0.9 % IV BOLUS (SEPSIS)
2000.0000 mL | Freq: Once | INTRAVENOUS | Status: AC
  Administered 2018-05-22: 2000 mL via INTRAVENOUS

## 2018-05-22 MED ORDER — IPRATROPIUM-ALBUTEROL 0.5-2.5 (3) MG/3ML IN SOLN
3.0000 mL | Freq: Three times a day (TID) | RESPIRATORY_TRACT | Status: DC
  Administered 2018-05-22 – 2018-05-25 (×9): 3 mL via RESPIRATORY_TRACT
  Filled 2018-05-22 (×8): qty 3

## 2018-05-22 MED ORDER — VANCOMYCIN HCL IN DEXTROSE 1-5 GM/200ML-% IV SOLN: 1000.0000 mg | Freq: Once | INTRAVENOUS | Status: DC

## 2018-05-22 MED ORDER — ALBUTEROL SULFATE (2.5 MG/3ML) 0.083% IN NEBU: 2.5000 mg | INHALATION_SOLUTION | RESPIRATORY_TRACT | Status: DC | PRN

## 2018-05-22 MED ORDER — LOSARTAN POTASSIUM 25 MG PO TABS
25.0000 mg | ORAL_TABLET | Freq: Every day | ORAL | Status: DC
  Administered 2018-05-22 – 2018-05-25 (×4): 25 mg via ORAL
  Filled 2018-05-22 (×4): qty 1

## 2018-05-22 MED ORDER — METRONIDAZOLE IN NACL 5-0.79 MG/ML-% IV SOLN
500.0000 mg | Freq: Three times a day (TID) | INTRAVENOUS | Status: DC
  Administered 2018-05-22 – 2018-05-24 (×6): 500 mg via INTRAVENOUS
  Filled 2018-05-22 (×7): qty 100

## 2018-05-22 MED ORDER — IPRATROPIUM BROMIDE 0.02 % IN SOLN: 0.5000 mg | Freq: Four times a day (QID) | RESPIRATORY_TRACT | Status: DC

## 2018-05-22 MED ORDER — SODIUM CHLORIDE 0.9 % IV SOLN
2.0000 g | Freq: Once | INTRAVENOUS | Status: AC
  Administered 2018-05-22: 2 g via INTRAVENOUS
  Filled 2018-05-22: qty 2

## 2018-05-22 MED ORDER — ACETAMINOPHEN 325 MG PO TABS: 650.0000 mg | ORAL_TABLET | Freq: Four times a day (QID) | ORAL | Status: DC | PRN

## 2018-05-22 MED ORDER — ATORVASTATIN CALCIUM 40 MG PO TABS
40.0000 mg | ORAL_TABLET | Freq: Every day | ORAL | Status: DC
  Administered 2018-05-22 – 2018-05-25 (×4): 40 mg via ORAL
  Filled 2018-05-22 (×4): qty 1

## 2018-05-22 MED ORDER — LACTATED RINGERS IV BOLUS (SEPSIS)
600.0000 mL | Freq: Once | INTRAVENOUS | Status: AC
  Administered 2018-05-22: 600 mL via INTRAVENOUS

## 2018-05-22 MED ORDER — CLOPIDOGREL BISULFATE 75 MG PO TABS
75.0000 mg | ORAL_TABLET | Freq: Every day | ORAL | Status: DC
  Administered 2018-05-22 – 2018-05-25 (×4): 75 mg via ORAL
  Filled 2018-05-22 (×4): qty 1

## 2018-05-22 MED ORDER — METHYLPREDNISOLONE SODIUM SUCC 40 MG IJ SOLR
40.0000 mg | Freq: Once | INTRAMUSCULAR | Status: AC
  Administered 2018-05-22: 40 mg via INTRAVENOUS
  Filled 2018-05-22: qty 1

## 2018-05-22 MED ORDER — IOPAMIDOL (ISOVUE-300) INJECTION 61%
INTRAVENOUS | Status: AC
  Filled 2018-05-22: qty 100

## 2018-05-22 MED ORDER — METRONIDAZOLE IN NACL 5-0.79 MG/ML-% IV SOLN: 500.0000 mg | Freq: Three times a day (TID) | INTRAVENOUS | Status: DC

## 2018-05-22 MED ORDER — PANTOPRAZOLE SODIUM 40 MG PO TBEC
40.0000 mg | DELAYED_RELEASE_TABLET | Freq: Every day | ORAL | Status: DC
  Administered 2018-05-22 – 2018-05-23 (×2): 40 mg via ORAL
  Filled 2018-05-22 (×2): qty 1

## 2018-05-22 MED ORDER — NITROGLYCERIN 0.4 MG SL SUBL: 0.4000 mg | SUBLINGUAL_TABLET | SUBLINGUAL | Status: DC | PRN

## 2018-05-22 MED ORDER — ENOXAPARIN SODIUM 40 MG/0.4ML ~~LOC~~ SOLN
40.0000 mg | SUBCUTANEOUS | Status: DC
  Administered 2018-05-22 – 2018-05-23 (×2): 40 mg via SUBCUTANEOUS
  Filled 2018-05-22 (×2): qty 0.4

## 2018-05-22 MED ORDER — ACETAMINOPHEN 650 MG RE SUPP: 650.0000 mg | Freq: Four times a day (QID) | RECTAL | Status: DC | PRN

## 2018-05-22 MED ORDER — LACTATED RINGERS IV BOLUS
1000.0000 mL | Freq: Once | INTRAVENOUS | Status: AC
  Administered 2018-05-22: 1000 mL via INTRAVENOUS

## 2018-05-22 MED ORDER — ASPIRIN EC 81 MG PO TBEC
81.0000 mg | DELAYED_RELEASE_TABLET | Freq: Every day | ORAL | Status: DC
  Administered 2018-05-22 – 2018-05-25 (×4): 81 mg via ORAL
  Filled 2018-05-22 (×4): qty 1

## 2018-05-22 MED ORDER — INSULIN ASPART 100 UNIT/ML ~~LOC~~ SOLN
0.0000 [IU] | Freq: Every day | SUBCUTANEOUS | Status: DC
  Administered 2018-05-22 – 2018-05-23 (×2): 3 [IU] via SUBCUTANEOUS

## 2018-05-22 MED ORDER — SODIUM CHLORIDE 0.9 % IV SOLN
1.0000 g | Freq: Three times a day (TID) | INTRAVENOUS | Status: DC
  Administered 2018-05-22 – 2018-05-24 (×5): 1 g via INTRAVENOUS
  Filled 2018-05-22 (×7): qty 1

## 2018-05-22 MED ORDER — VANCOMYCIN HCL IN DEXTROSE 1-5 GM/200ML-% IV SOLN
1000.0000 mg | Freq: Two times a day (BID) | INTRAVENOUS | Status: DC
  Administered 2018-05-23 – 2018-05-24 (×3): 1000 mg via INTRAVENOUS
  Filled 2018-05-22 (×3): qty 200

## 2018-05-22 MED ORDER — POTASSIUM CHLORIDE IN NACL 20-0.45 MEQ/L-% IV SOLN
INTRAVENOUS | Status: DC
  Administered 2018-05-22 – 2018-05-23 (×2): via INTRAVENOUS
  Filled 2018-05-22 (×2): qty 1000

## 2018-05-22 MED ORDER — INSULIN ASPART 100 UNIT/ML ~~LOC~~ SOLN
0.0000 [IU] | Freq: Three times a day (TID) | SUBCUTANEOUS | Status: DC
  Administered 2018-05-22: 4 [IU] via SUBCUTANEOUS
  Administered 2018-05-23 (×3): 11 [IU] via SUBCUTANEOUS
  Administered 2018-05-24 (×2): 3 [IU] via SUBCUTANEOUS
  Administered 2018-05-24: 4 [IU] via SUBCUTANEOUS
  Administered 2018-05-25: 3 [IU] via SUBCUTANEOUS

## 2018-05-22 MED ORDER — VANCOMYCIN HCL 10 G IV SOLR
2000.0000 mg | Freq: Once | INTRAVENOUS | Status: AC
  Administered 2018-05-22: 2000 mg via INTRAVENOUS
  Filled 2018-05-22: qty 2000

## 2018-05-22 NOTE — Progress Notes (Signed)
Pharmacy Antibiotic Note  William Norton is a 73 y.o. male admitted on 05/22/2018 with sepsis.  Pharmacy has been consulted for Vancomycin dosing.  05/22/2018:  Tm 99.68F  CXR & CT abdomen +PNA  WBC WNL, LA elevated  Scr at patient's baseline  Plan: Vancomycin 1gm IV q12h (target AUC 400-550) Monitor renal function and cx data  Cefepime/Flagyl per MD    Temp (24hrs), Avg:99.2 F (37.3 C), Min:98.7 F (37.1 C), Max:99.6 F (37.6 C)  Recent Labs  Lab 05/22/18 0920  WBC 7.0  CREATININE 1.12  LATICACIDVEN 4.2*    CrCl cannot be calculated (Unknown ideal weight.).    No Known Allergies  Antimicrobials this admission: 1/26 Vancomycin >>  1/26 Cefepime >>  1/26 Flagyl>>  Dose adjustments this admission:  Microbiology results: 1/26 BCx:  1/26 Influenza PCR: negative 1/26 UCx:   MRSA PCR:  Thank you for allowing pharmacy to be a part of this patient's care.  Biagio Borg 05/22/2018 12:54 PM

## 2018-05-22 NOTE — Progress Notes (Signed)
A consult was received from an ED physician for Vancomycin & Cefepime per pharmacy dosing.  The patient's profile has been reviewed for ht/wt/allergies/indication/available labs.   A one time order has been placed for Cefepime 2gm & Vancomycin 2gm IV.  Further antibiotics/pharmacy consults should be ordered by admitting physician if indicated.                       Thank you, Biagio Borg 05/22/2018  9:29 AM
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
## 2018-05-22 NOTE — ED Triage Notes (Addendum)
Pt with fever, nausea and vomiting since 2 am. Pt 88% on RA with EMS, pt arrived with 6L [place]. Pt 104.1 typanic, given 1000mg  Tylenol po by EMS. CBG: 103.

## 2018-05-22 NOTE — ED Notes (Signed)
Bed: FU93 Expected date:  Expected time:  Means of arrival:  Comments: N/V
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
## 2018-05-22 NOTE — ED Notes (Signed)
MD informed of Lactic 4.Marland Kitchen2

## 2018-05-23 ENCOUNTER — Other Ambulatory Visit: Payer: Self-pay

## 2018-05-23 DIAGNOSIS — F329 Major depressive disorder, single episode, unspecified: Secondary | ICD-10-CM

## 2018-05-23 DIAGNOSIS — F419 Anxiety disorder, unspecified: Secondary | ICD-10-CM

## 2018-05-23 DIAGNOSIS — R739 Hyperglycemia, unspecified: Secondary | ICD-10-CM

## 2018-05-23 DIAGNOSIS — J690 Pneumonitis due to inhalation of food and vomit: Secondary | ICD-10-CM

## 2018-05-23 LAB — URINE CULTURE
Culture: <10000 — AB
Special Requests: NORMAL

## 2018-05-23 LAB — COMPREHENSIVE METABOLIC PANEL
ALT: 35 U/L (ref 0–44)
AST: 29 U/L (ref 15–41)
Albumin: 3.5 g/dL (ref 3.5–5.0)
Alkaline Phosphatase: 39 U/L (ref 38–126)
Anion gap: 9 (ref 5–15)
BUN: 15 mg/dL (ref 8–23)
CO2: 20 mmol/L — ABNORMAL LOW (ref 22–32)
Calcium: 8.3 mg/dL — ABNORMAL LOW (ref 8.9–10.3)
Chloride: 107 mmol/L (ref 98–111)
Creatinine, Ser: 0.87 mg/dL (ref 0.61–1.24)
GFR calc non Af Amer: >60 mL/min (ref >60)
Glucose, Bld: 339 mg/dL — ABNORMAL HIGH (ref 70–99)
POTASSIUM: 4.6 mmol/L (ref 3.5–5.1)
Sodium: 136 mmol/L (ref 135–145)
Total Bilirubin: 1.1 mg/dL (ref 0.3–1.2)
Total Protein: 6.7 g/dL (ref 6.5–8.1)

## 2018-05-23 LAB — CBC WITH DIFFERENTIAL/PLATELET
Abs Immature Granulocytes: 0.09 10*3/uL — ABNORMAL HIGH (ref 0.00–0.07)
Basophils Absolute: 0 10*3/uL (ref 0.0–0.1)
Basophils Relative: 0 %
Eosinophils Absolute: 0 10*3/uL (ref 0.0–0.5)
Eosinophils Relative: 0 %
HCT: 36.5 % — ABNORMAL LOW (ref 39.0–52.0)
Hemoglobin: 11.5 g/dL — ABNORMAL LOW (ref 13.0–17.0)
Immature Granulocytes: 1 %
Lymphocytes Relative: 8 %
Lymphs Abs: 0.7 10*3/uL (ref 0.7–4.0)
MCH: 28.8 pg (ref 26.0–34.0)
MCHC: 31.5 g/dL (ref 30.0–36.0)
MCV: 91.5 fL (ref 80.0–100.0)
Monocytes Absolute: 0.2 10*3/uL (ref 0.1–1.0)
Monocytes Relative: 2 %
Neutro Abs: 8.5 10*3/uL — ABNORMAL HIGH (ref 1.7–7.7)
Neutrophils Relative %: 89 %
Platelets: 159 10*3/uL (ref 150–400)
RBC: 3.99 MIL/uL — ABNORMAL LOW (ref 4.22–5.81)
RDW: 15 % (ref 11.5–15.5)
WBC: 9.5 10*3/uL (ref 4.0–10.5)
nRBC: 0 % (ref 0.0–0.2)

## 2018-05-23 LAB — GLUCOSE, CAPILLARY
GLUCOSE-CAPILLARY: 296 mg/dL — AB (ref 70–99)
Glucose-Capillary: 280 mg/dL — ABNORMAL HIGH (ref 70–99)
Glucose-Capillary: 266 mg/dL — ABNORMAL HIGH (ref 70–99)
Glucose-Capillary: 272 mg/dL — ABNORMAL HIGH (ref 70–99)

## 2018-05-23 LAB — LIPASE, BLOOD: Lipase: 40 U/L (ref 11–51)

## 2018-05-23 MED ORDER — SODIUM CHLORIDE 0.9 % IV SOLN
INTRAVENOUS | Status: DC | PRN
  Administered 2018-05-23: 250 mL via INTRAVENOUS

## 2018-05-23 MED ORDER — PANTOPRAZOLE SODIUM 40 MG PO TBEC
40.0000 mg | DELAYED_RELEASE_TABLET | Freq: Two times a day (BID) | ORAL | Status: DC
  Administered 2018-05-23 – 2018-05-25 (×4): 40 mg via ORAL
  Filled 2018-05-23 (×4): qty 1

## 2018-05-23 MED ORDER — LIP MEDEX EX OINT
TOPICAL_OINTMENT | CUTANEOUS | Status: AC
  Administered 2018-05-23: 1
  Filled 2018-05-23: qty 7

## 2018-05-23 MED ORDER — GUAIFENESIN ER 600 MG PO TB12
600.0000 mg | ORAL_TABLET | Freq: Two times a day (BID) | ORAL | Status: DC
  Administered 2018-05-23 – 2018-05-25 (×4): 600 mg via ORAL
  Filled 2018-05-23 (×5): qty 1

## 2018-05-23 MED ORDER — SODIUM CHLORIDE 0.45 % IV SOLN
INTRAVENOUS | Status: DC
  Administered 2018-05-23 (×2): via INTRAVENOUS

## 2018-05-23 NOTE — Progress Notes (Signed)
[facility] PROGRESS NOTE  GWYNN CROSSLEY GGY:[id number] DOB: 08-13-1945 DOA: 05/22/2018  PCP: Colon Branch, MD  Brief History/Interval Summary: 73 y.o. male with medical history significant of anemia, GERD, history of CAD, depression, type 2 diabetes, history of subclinical hepatitis, hyperlipidemia, hypertension, bilateral hand numbness, who presented with complains of fever shortness of breath fatigue.  Apparently woke up on the morning of admission with burning abdominal pain followed by multiple episodes of nausea and vomiting.  Patient underwent a CT scan of the abdomen and pelvis which did not reveal any acute findings.  He had mildly elevated lipase.  Was also found to have left lower lobe infiltrate concerning for aspiration.  Patient was hospitalized as he was hypoxic.  There was also evidence for sepsis.  Reason for Visit: Sepsis likely secondary to aspiration pneumonia.  Consultants: None  Procedures: None  Antibiotics: Vancomycin cefepime and metronidazole  Subjective/Interval History: Patient states that he is feeling better this morning.  No longer has nausea.  Still has a cough.  Denies any chest pain shortness of breath.  Denies any abdominal pain.  ROS: Denies any headaches  Objective:  Vital Signs  Vitals:   05/23/18 0347 05/23/18 0400 05/23/18 0800 05/23/18 0828  BP: (!) 148/85 (!) 144/73    Pulse:  64    Resp:  18    Temp: (!) 97.4 F (36.3 C)  (!) 97.5 F (36.4 C)   TempSrc: Oral  Oral   SpO2: 93% 93%  99%    Intake/Output Summary (Last 24 hours) at 05/23/2018 1042 Last data filed at 05/23/2018 0352 Gross per 24 hour  Intake 3419.33 ml  Output 2250 ml  Net 1169.33 ml   There were no vitals filed for this visit.  General appearance: alert, cooperative, appears stated age and no distress Head: Normocephalic, without obvious abnormality, atraumatic Resp: Mildly tachypneic at rest.  Crackles appreciated bilateral bases left more than right.  No  wheezing or rhonchi. Cardio: regular rate and rhythm, S1, S2 normal, no murmur, click, rub or gallop GI: soft, non-tender; bowel sounds normal; no masses,  no organomegaly Extremities: extremities normal, atraumatic, no cyanosis or edema Pulses: 2+ and symmetric Neurologic: Alert and oriented x3.  No focal neurological deficits.  Lab Results:  Data Reviewed: I have personally reviewed following labs and imaging studies  CBC: Recent Labs  Lab 05/22/18 0920 05/23/18 0324  WBC 7.0 9.5  NEUTROABS 5.9 8.5*  HGB 13.0 11.5*  HCT 40.4 36.5*  MCV 89.4 91.5  PLT 210 035    Basic Metabolic Panel: Recent Labs  Lab 05/22/18 0920 05/23/18 0324  NA 136 136  K 4.3 4.6  CL 104 107  CO2 20* 20*  GLUCOSE 328* 339*  BUN 23 15  CREATININE 1.12 0.87  CALCIUM 8.9 8.3*    GFR: CrCl cannot be calculated (Unknown ideal weight.).  Liver Function Tests: Recent Labs  Lab 05/22/18 0920 05/23/18 0324  AST 50* 29  ALT 44 35  ALKPHOS 56 39  BILITOT 0.8 1.1  PROT 7.7 6.7  ALBUMIN 4.1 3.5    Recent Labs  Lab 05/22/18 0920 05/23/18 0324  LIPASE 63* 40   Coagulation Profile: Recent Labs  Lab 05/22/18 0920  INR 1.04    Cardiac Enzymes: Recent Labs  Lab 05/22/18 0920  TROPONINI <0.03    CBG: Recent Labs  Lab 05/22/18 1550 05/22/18 2136 05/23/18 0813  GLUCAP 172* 286* 280*     Recent Results (from the past 240 hour(s))  Culture, blood (Routine x 2)     Status: None (Preliminary result)   Collection Time: 05/22/18  9:20 AM  Result Value Ref Range Status   Specimen Description   Final    BLOOD LEFT ANTECUBITAL Performed at [facility], [place] [address], [place], [place] [address]    Special Requests   Final    BOTTLES DRAWN AEROBIC AND ANAEROBIC Blood Culture adequate volume Performed at [facility] [address] [place], [place] [address]    Culture PENDING  Incomplete   Report Status PENDING  Incomplete  MRSA PCR Screening      Status: None   Collection Time: 05/22/18  7:36 PM  Result Value Ref Range Status   MRSA by PCR NEGATIVE NEGATIVE Final    Comment:        The GeneXpert MRSA Assay (FDA approved for NASAL specimens only), is one component of a comprehensive MRSA colonization surveillance program. It is not intended to diagnose MRSA infection nor to guide or monitor treatment for MRSA infections. Performed at [facility], [place] [address], [place], [place] [address]       Radiology Studies: Ct Abdomen Pelvis W Contrast  Result Date: 05/22/2018 CLINICAL DATA:  Fever, nausea and vomiting. EXAM: CT ABDOMEN AND PELVIS WITH CONTRAST TECHNIQUE: Multidetector CT imaging of the abdomen and pelvis was performed using the standard protocol following bolus administration of intravenous contrast. CONTRAST:  134mL ISOVUE-300 IOPAMIDOL (ISOVUE-300) INJECTION 61% COMPARISON:  None FINDINGS: Lower chest: Patchy airspace densities within the posterior left lower lobe identified which may reflect pneumonia. No pleural effusions. Hepatobiliary: Hepatic steatosis. No focal liver abnormality. In gallbladder normal. No biliary dilatation. Pancreas: Unremarkable. No pancreatic ductal dilatation or surrounding inflammatory changes. Spleen: Normal in size without focal abnormality. Adrenals/Urinary Tract: Normal appearance of the adrenal glands. Bilateral kidney cysts are identified. The largest arises from the upper pole of left kidney measuring 4.7 cm. No mass or hydronephrosis. Urinary bladder is largely obscured by beam hardening artifact from patient's bilateral hip arthroplasty. Stomach/Bowel: Small hiatal hernia. Stomach otherwise unremarkable. The small bowel loops are normal in caliber. No wall thickening or inflammation. The appendix is visualized and appears normal. There is a left inguinal hernia which contains a nonobstructed loop of sigmoid colon. Vascular/Lymphatic: Aortic atherosclerosis. No aneurysm. No  abdominopelvic adenopathy identified. Reproductive: Uterus and bilateral adnexa are unremarkable. Other: Left inguinal hernia contains fat and nonobstructed loop of sigmoid colon. No free fluid or fluid collections. Musculoskeletal: Bilateral hip arthroplasty. Spondylosis is identified within the lower thoracic and lumbar spine. IMPRESSION: 1. No acute findings identified within the abdomen or pelvis. 2. Mild left lower lobe airspace densities and ground-glass attenuation which may represent pneumonia. 3.  Aortic Atherosclerosis (ICD10-I70.0). 4. Bilateral kidney cysts 5. Hepatic steatosis. Electronically Signed   By: Kerby Moors M.D.   On: 05/22/2018 10:59   Dg Chest Port 1 View  Result Date: 05/22/2018 CLINICAL DATA:  Fever, nausea and vomiting. EXAM: PORTABLE CHEST 1 VIEW COMPARISON:  07/14/2017 FINDINGS: The heart size and mediastinal contours are within normal limits. Patchy opacity at the left lung base may represent acute pneumonia. There is no evidence of pulmonary edema, pneumothorax, nodule or pleural fluid. The visualized skeletal structures are unremarkable. IMPRESSION: Opacity at the left lung base suspicious for pneumonia. Electronically Signed   By: Aletta Edouard M.D.   On: 05/22/2018 10:23     Medications:  Scheduled: . aspirin EC  81 mg Oral Daily  . atorvastatin  40 mg Oral Daily  . Brexpiprazole  1 mg Oral Daily  . clopidogrel  75 mg Oral Daily  . enoxaparin (LOVENOX) injection  40 mg Subcutaneous Q24H  . escitalopram  40 mg Oral Daily  . insulin aspart  0-20 Units Subcutaneous TID WC  . insulin aspart  0-5 Units Subcutaneous QHS  . ipratropium-albuterol  3 mL Nebulization TID  . losartan  25 mg Oral Daily  . metoprolol succinate  25 mg Oral Daily  . pantoprazole  40 mg Oral Daily   Continuous: . sodium chloride 75 mL/hr at 05/23/18 0829  . ceFEPime (MAXIPIME) IV Stopped (05/23/18 0553)  . metronidazole 500 mg (05/23/18 0832)  . vancomycin Stopped (05/23/18 0353)    MVH:[id number] **OR** acetaminophen, albuterol, nitroGLYCERIN    Assessment/Plan:  Acute respiratory failure with hypoxia Secondary to aspiration pneumonia.  Continue to use oxygen by nasal cannula.  Wean down as tolerated.  Seems to be stable from a respiratory standpoint.  Aspiration pneumonia with sepsis Patient had evidence for sepsis on admission with the presence of fever, tachypnea, hypoxia, tachycardia.  Patient also had elevated lactic acid levels.  Patient was hydrated.  Patient was started on antibiotics.  Lactic acid level improved.  Fever appears to have subsided.  Continue broad-spectrum antibiotics for now including vancomycin cefepime and metronidazole.  Follow-up on blood cultures.  Influenza PCR was negative.  UA did not suggest any infection.  Procalcitonin level was elevated at 4.61.  Nausea and vomiting Etiology for this is not entirely clear.  His abdomen is benign on examination.  CT scan did not show any acute findings.  Lipase was noted to be mildly elevated but there is no epigastric tenderness.  Elevated lipase most likely due to nausea and vomiting.  His LFTs were minimally abnormal at admission but normal this morning.  He does have acid reflux and he mentioned severe reflux over the past couple of days.  Continue with PPI for now.  Treat symptomatically.  Lipase is normal this morning.  Hyperglycemia Is never been formally diagnosed with diabetes.  Apparently his primary care physician has been testing him for it.  Glucose levels are significantly elevated this morning.  He did get a dose of steroids yesterday.  We will continue to trend CBGs.  Check HbA1c.  He will most likely need definitive treatment.  Normocytic anemia Drop in hemoglobin is likely dilutional.  No evidence of overt bleeding.  Continue to monitor.  Essential hypertension Continue metoprolol and losartan.  Monitor blood pressures closely.  Hyperlipidemia Continue statin  History of  GERD and Barrett's esophagus See above as well.  Continue PPI  History of depression and anxiety Continue home medications.  History of obstructive sleep apnea Apparently patient does not tolerate CPAP.  History of coronary artery disease Stable.  Continue Plavix.  Troponin was normal.  EKG did not show any overt ischemic changes.   DVT Prophylaxis: Lovenox    Code Status: Full code Family Communication: Discussed with the patient Disposition Plan: Management as outlined above.  Okay for transfer to the floor today.    LOS: 1 day   Dyamon Sosinski [employer] on [email or web address]  05/23/2018, 10:42 AM

## 2018-05-24 DIAGNOSIS — E119 Type 2 diabetes mellitus without complications: Secondary | ICD-10-CM

## 2018-05-24 DIAGNOSIS — K219 Gastro-esophageal reflux disease without esophagitis: Secondary | ICD-10-CM

## 2018-05-24 LAB — CBC
HCT: 35.8 % — ABNORMAL LOW (ref 39.0–52.0)
Hemoglobin: 11.3 g/dL — ABNORMAL LOW (ref 13.0–17.0)
MCH: 28.8 pg (ref 26.0–34.0)
MCHC: 31.6 g/dL (ref 30.0–36.0)
MCV: 91.1 fL (ref 80.0–100.0)
Platelets: 187 10*3/uL (ref 150–400)
RBC: 3.93 MIL/uL — ABNORMAL LOW (ref 4.22–5.81)
RDW: 15.1 % (ref 11.5–15.5)
WBC: 10.4 10*3/uL (ref 4.0–10.5)
nRBC: 0 % (ref 0.0–0.2)

## 2018-05-24 LAB — COMPREHENSIVE METABOLIC PANEL
ALT: 31 U/L (ref 0–44)
AST: 25 U/L (ref 15–41)
Albumin: 3.8 g/dL (ref 3.5–5.0)
Alkaline Phosphatase: 41 U/L (ref 38–126)
Anion gap: 10 (ref 5–15)
BUN: 21 mg/dL (ref 8–23)
CHLORIDE: 105 mmol/L (ref 98–111)
CO2: 19 mmol/L — ABNORMAL LOW (ref 22–32)
Calcium: 8.8 mg/dL — ABNORMAL LOW (ref 8.9–10.3)
Creatinine, Ser: 0.89 mg/dL (ref 0.61–1.24)
GFR calc Af Amer: >60 mL/min (ref >60)
GFR calc non Af Amer: >60 mL/min (ref >60)
Glucose, Bld: 196 mg/dL — ABNORMAL HIGH (ref 70–99)
Potassium: 4 mmol/L (ref 3.5–5.1)
Sodium: 134 mmol/L — ABNORMAL LOW (ref 135–145)
Total Bilirubin: 1 mg/dL (ref 0.3–1.2)
Total Protein: 7.1 g/dL (ref 6.5–8.1)

## 2018-05-24 LAB — HEMOGLOBIN A1C
Hgb A1c MFr Bld: 9.8 % — ABNORMAL HIGH (ref 4.8–5.6)
Mean Plasma Glucose: 234.56 mg/dL

## 2018-05-24 LAB — GLUCOSE, CAPILLARY
Glucose-Capillary: 146 mg/dL — ABNORMAL HIGH (ref 70–99)
Glucose-Capillary: 157 mg/dL — ABNORMAL HIGH (ref 70–99)
Glucose-Capillary: 143 mg/dL — ABNORMAL HIGH (ref 70–99)
Glucose-Capillary: 172 mg/dL — ABNORMAL HIGH (ref 70–99)

## 2018-05-24 MED ORDER — AMOXICILLIN-POT CLAVULANATE 875-125 MG PO TABS
1.0000 | ORAL_TABLET | Freq: Two times a day (BID) | ORAL | Status: DC
  Administered 2018-05-24 – 2018-05-25 (×3): 1 via ORAL
  Filled 2018-05-24 (×3): qty 1

## 2018-05-24 MED ORDER — ZOLPIDEM TARTRATE 5 MG PO TABS
5.0000 mg | ORAL_TABLET | Freq: Every evening | ORAL | Status: DC | PRN
  Administered 2018-05-24: 5 mg via ORAL
  Filled 2018-05-24: qty 1

## 2018-05-24 MED ORDER — SACCHAROMYCES BOULARDII 250 MG PO CAPS
250.0000 mg | ORAL_CAPSULE | Freq: Two times a day (BID) | ORAL | Status: DC
  Administered 2018-05-24 – 2018-05-25 (×3): 250 mg via ORAL
  Filled 2018-05-24 (×3): qty 1

## 2018-05-24 NOTE — Progress Notes (Signed)
[facility] PROGRESS NOTE  William Norton QQV:[id number] DOB: 11-28-1945 DOA: 05/22/2018  PCP: Colon Branch, MD  Brief History/Interval Summary: 73 y.o. male with medical history significant of anemia, GERD, history of CAD, depression, type 2 diabetes, history of subclinical hepatitis, hyperlipidemia, hypertension, bilateral hand numbness, who presented with complains of fever shortness of breath fatigue.  Apparently woke up on the morning of admission with burning abdominal pain followed by multiple episodes of nausea and vomiting.  Patient underwent a CT scan of the abdomen and pelvis which did not reveal any acute findings.  He had mildly elevated lipase.  Was also found to have left lower lobe infiltrate concerning for aspiration.  Patient was hospitalized as he was hypoxic.  There was also evidence for sepsis.  Reason for Visit: Sepsis likely secondary to aspiration pneumonia.  Consultants: None  Procedures: None  Antibiotics: Vancomycin cefepime and metronidazole Transitioned to Augmentin today  Subjective/Interval History: Patient states that he had 2 episodes of loose stool this morning.  His cough is better.  No shortness of breath.  No chest pain.  No nausea vomiting.  Has tolerated his diet.  ROS: Denies any headaches.  Objective:  Vital Signs  Vitals:   05/23/18 2042 05/23/18 2126 05/24/18 0554 05/24/18 1001  BP: 138/83  (!) 145/80   Pulse: 74  68 72  Resp: 18  18 17   Temp: 97.8 F (36.6 C)  97.7 F (36.5 C)   TempSrc: Oral  Oral   SpO2: 94% 96% 95% 92%    Intake/Output Summary (Last 24 hours) at 05/24/2018 1113 Last data filed at 05/24/2018 0735 Gross per 24 hour  Intake 2661.91 ml  Output 1575 ml  Net 1086.91 ml   There were no vitals filed for this visit.  General appearance: Awake alert.  In no distress Resp: Coarse breath sounds but improved from yesterday.  Few crackles bilateral bases.  No wheezing or rhonchi.   Cardio: S1-S2 is normal  regular.  No S3-S4.  No rubs murmurs or bruit GI: Abdomen is soft.  Nontender nondistended.  Bowel sounds are present normal.  No masses organomegaly Extremities: No edema.  Full range of motion of lower extremities. Neurologic: Alert and oriented x3.  No focal neurological deficits.    Lab Results:  Data Reviewed: I have personally reviewed following labs and imaging studies  CBC: Recent Labs  Lab 05/22/18 0920 05/23/18 0324 05/24/18 0411  WBC 7.0 9.5 10.4  NEUTROABS 5.9 8.5*  --   HGB 13.0 11.5* 11.3*  HCT 40.4 36.5* 35.8*  MCV 89.4 91.5 91.1  PLT 210 159 332    Basic Metabolic Panel: Recent Labs  Lab 05/22/18 0920 05/23/18 0324 05/24/18 0411  NA 136 136 134*  K 4.3 4.6 4.0  CL 104 107 105  CO2 20* 20* 19*  GLUCOSE 328* 339* 196*  BUN 23 15 21   CREATININE 1.12 0.87 0.89  CALCIUM 8.9 8.3* 8.8*    GFR: CrCl cannot be calculated (Unknown ideal weight.).  Liver Function Tests: Recent Labs  Lab 05/22/18 0920 05/23/18 0324 05/24/18 0411  AST 50* 29 25  ALT 44 35 31  ALKPHOS 56 39 41  BILITOT 0.8 1.1 1.0  PROT 7.7 6.7 7.1  ALBUMIN 4.1 3.5 3.8    Recent Labs  Lab 05/22/18 0920 05/23/18 0324  LIPASE 63* 40   Coagulation Profile: Recent Labs  Lab 05/22/18 0920  INR 1.04    Cardiac Enzymes: Recent Labs  Lab 05/22/18 0920  TROPONINI <  0.03    CBG: Recent Labs  Lab 05/23/18 0813 05/23/18 1215 05/23/18 1728 05/23/18 2143 05/24/18 0734  GLUCAP 280* 266* 272* 296* 146*     Recent Results (from the past 240 hour(s))  Culture, blood (Routine x 2)     Status: None (Preliminary result)   Collection Time: 05/22/18  9:20 AM  Result Value Ref Range Status   Specimen Description   Final    BLOOD LEFT ANTECUBITAL Performed at [facility], [place] [address] [place], [place] [address]    Special Requests   Final    BOTTLES DRAWN AEROBIC AND ANAEROBIC Blood Culture adequate volume Performed at [facility]  [address] [place], [place] [address]    Culture   Final    NO GROWTH 2 DAYS Performed at [facility] [address] [place], [place] [address]    Report Status PENDING  Incomplete  Urine culture     Status: Abnormal   Collection Time: 05/22/18  9:23 AM  Result Value Ref Range Status   Specimen Description   Final    URINE, CLEAN CATCH Performed at [facility], [place] [address], [place], [place] [address]    Special Requests   Final    Normal Performed at [facility], [place] [address] [place], [place] [address]    Culture (A)  Final    <10,000 COLONIES/mL INSIGNIFICANT GROWTH Performed at [facility] [address], [place], [place] [address]    Report Status 05/23/2018 FINAL  Final  MRSA PCR Screening     Status: None   Collection Time: 05/22/18  7:36 PM  Result Value Ref Range Status   MRSA by PCR NEGATIVE NEGATIVE Final    Comment:        The GeneXpert MRSA Assay (FDA approved for NASAL specimens only), is one component of a comprehensive MRSA colonization surveillance program. It is not intended to diagnose MRSA infection nor to guide or monitor treatment for MRSA infections. Performed at [facility], [place] [address] [place], [place] [address]       Radiology Studies: No results found.   Medications:  Scheduled: . amoxicillin-clavulanate  1 tablet Oral Q12H  . aspirin EC  81 mg Oral Daily  . atorvastatin  40 mg Oral Daily  . Brexpiprazole  1 mg Oral Daily  . clopidogrel  75 mg Oral Daily  . enoxaparin (LOVENOX) injection  40 mg Subcutaneous Q24H  . escitalopram  40 mg Oral Daily  . guaiFENesin  600 mg Oral BID  . insulin aspart  0-20 Units Subcutaneous TID WC  . insulin aspart  0-5 Units Subcutaneous QHS  . ipratropium-albuterol  3 mL Nebulization TID  . losartan  25 mg Oral Daily  . metoprolol succinate  25 mg Oral Daily  . pantoprazole  40 mg Oral BID  . saccharomyces boulardii   250 mg Oral BID   Continuous: . sodium chloride 10 mL/hr at 05/24/18 0930  . sodium chloride 250 mL (05/23/18 1533)   FIE:PPIRJJ chloride, acetaminophen **OR** acetaminophen, albuterol, nitroGLYCERIN    Assessment/Plan:  Acute respiratory failure with hypoxia Secondary to aspiration pneumonia.  He required oxygen initially.  Now has been weaned to room air.  Mobilize.   Aspiration pneumonia with sepsis Patient had evidence for sepsis on admission with the presence of fever, tachypnea, hypoxia, tachycardia.  Patient also had elevated lactic acid levels.  Patient was hydrated.  Patient was  started on antibiotics.  Lactic acid level improved.  Fever appears to have subsided.  Cultures are negative so far.  We will stop his broad-spectrum coverage which included vancomycin cefepime and metronidazole.  We will start him on Augmentin due to aspiration pneumonia.  Influenza PCR was negative.  UA did not show any infection.  Procalcitonin was elevated at 4.6.    Nausea and vomiting/severe acid reflux Etiology for this is not entirely clear.  His abdomen is benign on examination.  CT scan did not show any acute findings.  Lipase was noted to be mildly elevated but there is no epigastric tenderness.  Elevated lipase most likely due to nausea and vomiting.  Subsequently noted to be normal.  His LFTs were minimally abnormal at admission but normal subsequently.  He does have acid reflux and he mentioned severe reflux over the past couple of days.  Appears to have improved with PPI which will be continued.  He is tolerating diet now.  Loose stools most likely due to antibiotics.  Probiotics will be prescribed.  Hyperglycemia Has not been formally diagnosed with diabetes.  Apparently his primary care physician has been testing him for it.  Glucose levels were significantly elevated.  He did get a dose of steroid in the ED which could have contributed as well.  However his HbA1c is noted to be 9.8.  Continue  SSI for now.  He will benefit from definitive management.  Could consider metformin at discharge.  Continue to check CBGs.  Normocytic anemia Drop in hemoglobin is likely dilutional.  No evidence of overt bleeding.  IMA globin remains stable.  Essential hypertension Continue metoprolol and losartan.  Monitor blood pressures closely.  Hyperlipidemia Continue statin  History of GERD and Barrett's esophagus See above as well.  Continue PPI  History of depression and anxiety Continue home medications.  History of obstructive sleep apnea Apparently patient does not tolerate CPAP.  History of coronary artery disease Stable.  Continue Plavix.  Troponin was normal.  EKG did not show any overt ischemic changes.   DVT Prophylaxis: Lovenox    Code Status: Full code Family Communication: Discussed with the patient Disposition Plan: Mobilize.  Change to oral antibiotics today.  Recheck labs tomorrow.  Possible discharge tomorrow.    LOS: 2 days   [facility] Pager on [email or web address]  05/24/2018, 11:13 AM

## 2018-05-25 DIAGNOSIS — I10 Essential (primary) hypertension: Secondary | ICD-10-CM

## 2018-05-25 LAB — BASIC METABOLIC PANEL
Anion gap: 10 (ref 5–15)
BUN: 21 mg/dL (ref 8–23)
CO2: 22 mmol/L (ref 22–32)
Calcium: 9.3 mg/dL (ref 8.9–10.3)
Chloride: 105 mmol/L (ref 98–111)
Creatinine, Ser: 0.94 mg/dL (ref 0.61–1.24)
GFR calc Af Amer: >60 mL/min (ref >60)
GFR calc non Af Amer: >60 mL/min (ref >60)
Glucose, Bld: 160 mg/dL — ABNORMAL HIGH (ref 70–99)
Potassium: 4.1 mmol/L (ref 3.5–5.1)
Sodium: 137 mmol/L (ref 135–145)

## 2018-05-25 LAB — GLUCOSE, CAPILLARY: Glucose-Capillary: 148 mg/dL — ABNORMAL HIGH (ref 70–99)

## 2018-05-25 MED ORDER — SACCHAROMYCES BOULARDII 250 MG PO CAPS: 250.0000 mg | ORAL_CAPSULE | Freq: Two times a day (BID) | ORAL | 5 refills | Status: DC

## 2018-05-25 MED ORDER — METFORMIN HCL 500 MG PO TABS: 500.0000 mg | ORAL_TABLET | Freq: Two times a day (BID) | ORAL | 5 refills | Status: DC

## 2018-05-25 MED ORDER — AMOXICILLIN-POT CLAVULANATE 875-125 MG PO TABS: 1.0000 | ORAL_TABLET | Freq: Two times a day (BID) | ORAL | 0 refills | Status: AC

## 2018-05-25 MED ORDER — METFORMIN HCL 500 MG PO TABS: 500.0000 mg | ORAL_TABLET | Freq: Two times a day (BID) | ORAL | Status: DC

## 2018-05-25 NOTE — Discharge Summary (Addendum)
Physician Discharge Summary  Patient ID: William Norton MRN: [id number] DOB/AGE: 08-28-1945 73 y.o.  Admit date: 05/22/2018 Discharge date: 05/25/2018  Admission Diagnoses:   Sepsis   Aspiration pneumonia   Acute resp failure w/ hypoxemia   Nausea/ vomiting/ GERD   Type 2 diabetes mellitus (HCC)   Hypertension   GERD   Barrett's esophagus   OSA (obstructive sleep apnea)  Discharge Diagnoses:    Sepsis   Aspiration pneumonia   Acute resp failure w/ hypoxemia   Type 2 diabetes mellitus ([place])   Hypertension   GERD   Barrett's esophagus   OSA (obstructive sleep apnea)   Discharged Condition: good  Presentation Summary: 73 y.o.malewith medical history significant ofanemia, GERD, history of CAD, depression, type 2 diabetes, history of subclinical hepatitis, hyperlipidemia, hypertension, bilateral hand numbness, who presented with complains of fever shortness of breath fatigue.  Apparently woke up on the morning of admission with burning abdominal pain followed by multiple episodes of nausea and vomiting.  Patient underwent a CT scan of the abdomen and pelvis which did not reveal any acute findings.  He had mildly elevated lipase.  Was also found to have left lower lobe infiltrate concerning for aspiration.  Patient was hospitalized as he was hypoxic.  There was also evidence for sepsis.    Hospital Course: Acute respiratory failure with hypoxia Secondary to aspiration pneumonia.  He required oxygen initially.  Then weaned to room air. No O2 at dc.   Aspiration pneumonia with sepsis Patient had evidence for sepsis on admission with the presence of fever, tachypnea, hypoxia, tachycardia.  Patient also had elevated lactic acid levels.  Patient was hydrated.  Patient was started on antibiotics.  Lactic acid level improved.  Fever appears to have subsided.  Cultures were negative.  - got vancomycin/ cefepime/ flagyl IV from 2 days in hospital 1/26 - 1/27 - will dc on Augmentin due to  aspiration pneumonia to complete a 7d course - Influenza PCR was negative - UA did not show any infection - Procalcitonin was elevated at 4.6.    Nausea and vomiting/severe acid reflux Etiology for this is not entirely clear.  His abdomen was benign on examination.  CT scan was negative. Lipase was noted to be mildly elevated but there is no epigastric tenderness.  Elevated lipase most likely due to nausea and vomiting.  Subsequently noted to be normal.  His LFTs were minimally abnormal at admission but normal subsequently.  He does have acid reflux and he mentioned severe reflux over the few days pta  - Appears to have improved with PPI Rx, cont PPI at dc - Loose stools most likely due to antibiotics >  Probiotics will be prescribed  Hyperglycemia/ new DM2 Has not been formally diagnosed with diabetes.  Apparently his primary care physician has been testing him for it.  Glucose levels were significantly elevated.  He did get a dose of steroid in the ED which could have contributed as well.  His HbA1c is noted to be 9.8.  Got SSI in the hospital w/ BS's 140 - 180 on SSI. - dc on metformin 500mg  bid - f/u w/ PCP about DM counseling  Normocytic anemia Drop in hemoglobin is likely dilutional.  No evidence of overt bleeding.  Hb remains stable.  Essential hypertension Continue metoprolol and losartan.  Monitor blood pressures closely.  Hyperlipidemia Continue statin  History of GERD and Barrett's esophagus See above as well.  Continue PPI  History of depression and anxiety Continue home  medications.  History of obstructive sleep apnea Apparently patient does not tolerate CPAP.  History of coronary artery disease Stable.  Continue Plavix.  Troponin was normal.  EKG did not show any overt ischemic changes.    Consults: none  Treatments: IV vanc 1/26- 1/27 IV flagyl 1/26- 1/27 IV cefepime 1/26- 1/27  Discharge Exam: Blood pressure (!) 163/97, pulse 60, temperature  98.1 F (36.7 C), temperature source Oral, resp. rate 16, height 5\' 11"  (1.803 m), weight 104 kg, SpO2 93 %. General appearance: Awake alert.  In no distress Resp: Coarse breath sounds but improved from yesterday.  Few crackles bilateral bases.  No wheezing or rhonchi.   Cardio: S1-S2 is normal regular.  No S3-S4.  No rubs murmurs or bruit GI: Abdomen is soft.  Nontender nondistended.  Bowel sounds are present normal.  No masses organomegaly Extremities: No edema.  Full range of motion of lower extremities. Neurologic: Alert and oriented x3.  No focal neurological deficits.    Disposition: Discharge disposition: 01-Home or Self Care        Allergies as of 05/25/2018   No Known Allergies     Medication List    TAKE these medications   amoxicillin-clavulanate 875-125 MG tablet Commonly known as:  AUGMENTIN Take 1 tablet by mouth every 12 (twelve) hours for 4 days.   aspirin EC 81 MG tablet Take 1 tablet (81 mg total) by mouth daily.   atorvastatin 40 MG tablet Commonly known as:  LIPITOR Take 1 tablet by mouth daily   celecoxib 50 MG capsule Commonly known as:  CELEBREX Take 1 capsule (50 mg total) by mouth daily as needed for pain. What changed:  when to take this   clopidogrel 75 MG tablet Commonly known as:  PLAVIX Take 1 tablet by mouth daily with breakfast What changed:  when to take this   escitalopram 20 MG tablet Commonly known as:  LEXAPRO Take 2 tablets (40 mg total) by mouth daily.   losartan 50 MG tablet Commonly known as:  COZAAR Take 0.5 tablets (25 mg total) by mouth daily.   metFORMIN 500 MG tablet Commonly known as:  GLUCOPHAGE Take 1 tablet (500 mg total) by mouth 2 (two) times daily with a meal.   metoprolol succinate 25 MG 24 hr tablet Commonly known as:  TOPROL-XL Take 1 tablet by mouth daily   nitroGLYCERIN 0.4 MG SL tablet Commonly known as:  NITROSTAT Place 1 tablet (0.4 mg total) under the tongue every 5 (five) minutes as  needed. What changed:  reasons to take this   pantoprazole 40 MG tablet Commonly known as:  PROTONIX Take 1 tablet by mouth daily   REXULTI 1 MG Tabs Generic drug:  Brexpiprazole Take 1 mg by mouth daily.   saccharomyces boulardii 250 MG capsule Commonly known as:  FLORASTOR Take 1 capsule (250 mg total) by mouth 2 (two) times daily.        Signed: Sandy Salaam Blain Hunsucker 05/25/2018, 11:18 AM

## 2018-05-25 NOTE — Progress Notes (Signed)
Pt was discharged home today. Instructions were reviewed with patient, and questions were answered. Pt was taken to main entrance via wheelchair by NT.  

## 2018-05-25 NOTE — Care Management Important Message (Signed)
Important Message  Patient Details  Name: ADEM COSTLOW MRN: [id number] Date of Birth: 06/22/1945   Medicare Important Message Given:  Yes    Kerin Salen 05/25/2018, 10:59 AMImportant Message  Patient Details  Name: ARLING CERONE MRN: [id number] Date of Birth: [birth year]   Medicare Important Message Given:  Yes    Kerin Salen 05/25/2018, 10:59 AM

## 2018-05-26 ENCOUNTER — Telehealth: Payer: Self-pay | Admitting: *Deleted

## 2018-05-26 NOTE — Telephone Encounter (Signed)
Transition Care Management Follow-up Telephone Call   Date discharged? 05/25/18   How have you been since you were released from the hospital? "weak, but okay"   Do you understand why you were in the hospital? yes   Do you understand the discharge instructions? yes   Where were you discharged to? Home w/ wife   Items Reviewed:  Medications reviewed: yes  Allergies reviewed: yes  Dietary changes reviewed: yes  Referrals reviewed: yes   Functional Questionnaire:   Activities of Daily Living (ADLs):   He states they are independent in the following: ambulation, bathing and hygiene, feeding, continence, grooming, toileting and dressing States they require assistance with the following: na   Any transportation issues/concerns?: no   Any patient concerns? no   Confirmed importance and date/time of follow-up visits scheduled yes  Provider Appointment booked with Dr.[name] 05/30/2018 @1120   Confirmed with patient if condition begins to worsen call PCP or go to the ER.  Patient was given the office number and encouraged to call back with question or concerns.  : yes

## 2018-05-27 LAB — CULTURE, BLOOD (ROUTINE X 2)
Culture: NO GROWTH
Special Requests: ADEQUATE

## 2018-05-30 ENCOUNTER — Ambulatory Visit (INDEPENDENT_AMBULATORY_CARE_PROVIDER_SITE_OTHER): Payer: Medicare HMO | Admitting: Internal Medicine

## 2018-05-30 ENCOUNTER — Encounter: Payer: Self-pay | Admitting: Internal Medicine

## 2018-05-30 VITALS — BP 122/74 | HR 67 | Temp 97.8°F | Resp 16 | Ht 72.0 in | Wt 232.5 lb

## 2018-05-30 DIAGNOSIS — E119 Type 2 diabetes mellitus without complications: Secondary | ICD-10-CM

## 2018-05-30 DIAGNOSIS — R69 Illness, unspecified: Secondary | ICD-10-CM | POA: Diagnosis not present

## 2018-05-30 DIAGNOSIS — F329 Major depressive disorder, single episode, unspecified: Secondary | ICD-10-CM

## 2018-05-30 DIAGNOSIS — J189 Pneumonia, unspecified organism: Secondary | ICD-10-CM

## 2018-05-30 DIAGNOSIS — J9601 Acute respiratory failure with hypoxia: Secondary | ICD-10-CM

## 2018-05-30 DIAGNOSIS — F419 Anxiety disorder, unspecified: Secondary | ICD-10-CM

## 2018-05-30 DIAGNOSIS — E785 Hyperlipidemia, unspecified: Secondary | ICD-10-CM

## 2018-05-30 DIAGNOSIS — J690 Pneumonitis due to inhalation of food and vomit: Secondary | ICD-10-CM

## 2018-05-30 DIAGNOSIS — M1990 Unspecified osteoarthritis, unspecified site: Secondary | ICD-10-CM | POA: Diagnosis not present

## 2018-05-30 MED ORDER — METFORMIN HCL 1000 MG PO TABS: 1000.0000 mg | ORAL_TABLET | Freq: Two times a day (BID) | ORAL | 3 refills | Status: DC

## 2018-05-30 MED ORDER — ONETOUCH LANCETS MISC: 12 refills | Status: DC

## 2018-05-30 MED ORDER — ONETOUCH VERIO W/DEVICE KIT: PACK | 0 refills | Status: DC

## 2018-05-30 MED ORDER — GLUCOSE BLOOD VI STRP: ORAL_STRIP | 12 refills | Status: DC

## 2018-05-30 NOTE — Patient Instructions (Addendum)
Per our records you are due for an eye exam. Please contact your eye doctor to schedule an appointment. Please have them send copies of your office visit notes to [place]. Our fax number is [phone].   GO TO THE FRONT DESK Come back in 2 weeks for blood work only, fasting. That day, you also need to go to the first floor and get a chest x-ray  Schedule your next appointment   a checkup in 3 months  Where changing metformin to 1000 mg: 1 tablet twice a day  Continue Protonix for 2 or  3 weeks then okay to stop   Diabetes: Check your blood sugar  2 to 3 times a day Check your blood sugar  at different times of the day  GOALS: Fasting before a meal 70- 130 2 hours after a meal less than 180
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
## 2018-05-30 NOTE — Progress Notes (Signed)
Pre visit review using our clinic review tool, if applicable. No additional management support is needed unless otherwise documented below in the visit note. 

## 2018-05-30 NOTE — Assessment & Plan Note (Signed)
Sepsis, aspiration pneumonia: Seems to be doing better, he finished antibiotics.  We will get a CMP, CBC and chest x-ray in 2 weeks. Nausea, vomiting, abdominal pain: sxs  improved, recommend to continue PPIs for few more days.  On retrospect, the patient admits to overeating and eating late and that might be the culprit of N-V.  He plans to change his habits. DM: During the admission, the A1c was 9.8, higher than before, the patient admits to not following a healthy diet.  Plan: Increase metformin to 1000 mg twice a day, watch diet carefully, recheck on RTC Depression anxiety: In addition to Rexulti and Lexapro he is now taking Abilify and feels better DJD: Takes Celebrex without apparent side effects.  Last kidney function normal. LUTS: Last week, urology provided samples for new medication, name?   RTC 2 weeks for labs and chest x-ray RTC 3 months for routine checkup

## 2018-05-31 ENCOUNTER — Other Ambulatory Visit: Payer: Self-pay

## 2018-05-31 ENCOUNTER — Other Ambulatory Visit (HOSPITAL_COMMUNITY): Payer: Self-pay

## 2018-05-31 ENCOUNTER — Telehealth: Payer: Self-pay | Admitting: *Deleted

## 2018-05-31 DIAGNOSIS — Z9189 Other specified personal risk factors, not elsewhere classified: Secondary | ICD-10-CM

## 2018-05-31 DIAGNOSIS — R1310 Dysphagia, unspecified: Secondary | ICD-10-CM

## 2018-05-31 NOTE — Telephone Encounter (Signed)
Received FMLA/STD paperwork from patient's daughter to care for Father while recovering from hospital admittance, completed as much as possible; forwarded to provider/SLS 02/04

## 2018-06-06 ENCOUNTER — Encounter: Payer: Self-pay | Admitting: Internal Medicine

## 2018-06-06 MED ORDER — ONETOUCH VERIO W/DEVICE KIT: PACK | 0 refills | Status: DC

## 2018-06-08 MED ORDER — ONETOUCH VERIO W/DEVICE KIT: PACK | 0 refills | Status: AC

## 2018-06-08 NOTE — Addendum Note (Signed)
Addended byDamita Dunnings D on: 06/08/2018 07:54 AM   Modules accepted: Orders

## 2018-06-09 ENCOUNTER — Other Ambulatory Visit: Payer: Self-pay

## 2018-06-09 DIAGNOSIS — Z9189 Other specified personal risk factors, not elsewhere classified: Secondary | ICD-10-CM

## 2018-06-09 DIAGNOSIS — R69 Illness, unspecified: Secondary | ICD-10-CM | POA: Diagnosis not present

## 2018-06-10 DIAGNOSIS — R69 Illness, unspecified: Secondary | ICD-10-CM | POA: Diagnosis not present

## 2018-06-13 ENCOUNTER — Ambulatory Visit (HOSPITAL_COMMUNITY)
Admission: RE | Admit: 2018-06-13 | Discharge: 2018-06-13 | Disposition: A | Discharge status: Home / Self Care | Payer: Medicare HMO | Source: Ambulatory Visit | Attending: Internal Medicine | Admitting: Internal Medicine

## 2018-06-13 ENCOUNTER — Other Ambulatory Visit (INDEPENDENT_AMBULATORY_CARE_PROVIDER_SITE_OTHER): Payer: Medicare HMO

## 2018-06-13 DIAGNOSIS — E119 Type 2 diabetes mellitus without complications: Secondary | ICD-10-CM

## 2018-06-13 DIAGNOSIS — Z9189 Other specified personal risk factors, not elsewhere classified: Secondary | ICD-10-CM | POA: Insufficient documentation

## 2018-06-13 DIAGNOSIS — J189 Pneumonia, unspecified organism: Secondary | ICD-10-CM | POA: Insufficient documentation

## 2018-06-13 DIAGNOSIS — E785 Hyperlipidemia, unspecified: Secondary | ICD-10-CM | POA: Diagnosis not present

## 2018-06-13 DIAGNOSIS — R1310 Dysphagia, unspecified: Secondary | ICD-10-CM | POA: Insufficient documentation

## 2018-06-13 LAB — CBC WITH DIFFERENTIAL/PLATELET
Basophils Absolute: 0.1 10*3/uL (ref 0.0–0.1)
Basophils Relative: 1.1 % (ref 0.0–3.0)
Eosinophils Absolute: 0.5 10*3/uL (ref 0.0–0.7)
Eosinophils Relative: 8 % — ABNORMAL HIGH (ref 0.0–5.0)
HCT: 39.3 % (ref 39.0–52.0)
Hemoglobin: 12.9 g/dL — ABNORMAL LOW (ref 13.0–17.0)
LYMPHS ABS: 1.7 10*3/uL (ref 0.7–4.0)
Lymphocytes Relative: 27.6 % (ref 12.0–46.0)
MCHC: 32.8 g/dL (ref 30.0–36.0)
MCV: 85.3 fl (ref 78.0–100.0)
Monocytes Absolute: 0.4 10*3/uL (ref 0.1–1.0)
Monocytes Relative: 7.2 % (ref 3.0–12.0)
Neutro Abs: 3.4 10*3/uL (ref 1.4–7.7)
Neutrophils Relative %: 56.1 % (ref 43.0–77.0)
Platelets: 247 10*3/uL (ref 150.0–400.0)
RBC: 4.61 Mil/uL (ref 4.22–5.81)
RDW: 15 % (ref 11.5–15.5)
WBC: 6 10*3/uL (ref 4.0–10.5)

## 2018-06-13 LAB — COMPREHENSIVE METABOLIC PANEL
ALK PHOS: 59 U/L (ref 39–117)
ALT: 37 U/L (ref 0–53)
AST: 33 U/L (ref 0–37)
Albumin: 4.2 g/dL (ref 3.5–5.2)
BUN: 16 mg/dL (ref 6–23)
CALCIUM: 9.3 mg/dL (ref 8.4–10.5)
CO2: 24 mEq/L (ref 19–32)
Chloride: 105 mEq/L (ref 96–112)
Creatinine, Ser: 0.94 mg/dL (ref 0.40–1.50)
GFR: 78.49 mL/min (ref >60.00)
Glucose, Bld: 126 mg/dL — ABNORMAL HIGH (ref 70–99)
Potassium: 4.2 mEq/L (ref 3.5–5.1)
Sodium: 139 mEq/L (ref 135–145)
TOTAL PROTEIN: 7 g/dL (ref 6.0–8.3)
Total Bilirubin: 0.8 mg/dL (ref 0.2–1.2)

## 2018-06-13 LAB — LIPID PANEL
Cholesterol: 132 mg/dL (ref 0–200)
HDL: 55.8 mg/dL (ref >39.00)
LDL Cholesterol: 51 mg/dL (ref 0–99)
NonHDL: 76.63
Total CHOL/HDL Ratio: 2
Triglycerides: 126 mg/dL (ref 0.0–149.0)
VLDL: 25.2 mg/dL (ref 0.0–40.0)

## 2018-06-14 ENCOUNTER — Ambulatory Visit (HOSPITAL_COMMUNITY)
Admission: RE | Admit: 2018-06-14 | Discharge: 2018-06-14 | Disposition: A | Discharge status: Home / Self Care | Payer: Medicare HMO | Source: Ambulatory Visit | Attending: Primary Care | Admitting: Primary Care

## 2018-06-14 DIAGNOSIS — R1310 Dysphagia, unspecified: Secondary | ICD-10-CM

## 2018-06-14 DIAGNOSIS — K219 Gastro-esophageal reflux disease without esophagitis: Secondary | ICD-10-CM | POA: Diagnosis not present

## 2018-06-14 DIAGNOSIS — Z9189 Other specified personal risk factors, not elsewhere classified: Secondary | ICD-10-CM

## 2018-06-27 DIAGNOSIS — R69 Illness, unspecified: Secondary | ICD-10-CM | POA: Diagnosis not present

## 2018-06-27 DIAGNOSIS — F411 Generalized anxiety disorder: Secondary | ICD-10-CM | POA: Diagnosis not present

## 2018-07-06 ENCOUNTER — Encounter: Payer: Self-pay | Admitting: Internal Medicine

## 2018-07-07 MED ORDER — METFORMIN HCL 1000 MG PO TABS: 1000.0000 mg | ORAL_TABLET | Freq: Two times a day (BID) | ORAL | 3 refills | Status: DC

## 2018-07-15 MED ORDER — METFORMIN HCL 1000 MG PO TABS: 1000.0000 mg | ORAL_TABLET | Freq: Two times a day (BID) | ORAL | 3 refills | Status: DC

## 2018-07-15 NOTE — Addendum Note (Signed)
Addended byDamita Dunnings D on: 07/15/2018 07:46 AM   Modules accepted: Orders

## 2018-07-28 ENCOUNTER — Other Ambulatory Visit: Payer: Self-pay

## 2018-07-28 ENCOUNTER — Ambulatory Visit (INDEPENDENT_AMBULATORY_CARE_PROVIDER_SITE_OTHER): Payer: Medicare HMO | Admitting: Internal Medicine

## 2018-07-28 ENCOUNTER — Telehealth: Payer: Self-pay

## 2018-07-28 ENCOUNTER — Encounter: Payer: Self-pay | Admitting: Internal Medicine

## 2018-07-28 VITALS — Ht 71.5 in | Wt 225.0 lb

## 2018-07-28 DIAGNOSIS — K2270 Barrett's esophagus without dysplasia: Secondary | ICD-10-CM | POA: Diagnosis not present

## 2018-07-28 DIAGNOSIS — K219 Gastro-esophageal reflux disease without esophagitis: Secondary | ICD-10-CM

## 2018-07-28 DIAGNOSIS — R634 Abnormal weight loss: Secondary | ICD-10-CM | POA: Diagnosis not present

## 2018-07-28 DIAGNOSIS — R1310 Dysphagia, unspecified: Secondary | ICD-10-CM | POA: Diagnosis not present

## 2018-07-28 DIAGNOSIS — Z7902 Long term (current) use of antithrombotics/antiplatelets: Secondary | ICD-10-CM | POA: Diagnosis not present

## 2018-07-28 MED ORDER — PANTOPRAZOLE SODIUM 40 MG PO TBEC: 40.0000 mg | DELAYED_RELEASE_TABLET | Freq: Two times a day (BID) | ORAL | 3 refills | Status: DC

## 2018-07-28 NOTE — Telephone Encounter (Signed)
  [facility] Pre-operative Risk Assessment     Request for surgical clearance:     Endoscopy Procedure  What type of surgery is being performed?     EGD with Dilation  When is this surgery scheduled?     Hopefully 08/11/2018  What type of clearance is required ?   Pharmacy  Are there any medications that need to be held prior to surgery and how long?Plavix, 5 days   Practice name and name of physician performing surgery?      [facility]  What is your office phone and fax number?      Phone- [phone]  [phone]  Anesthesia type (None, local, MAC, general) ?       MAC

## 2018-07-28 NOTE — Patient Instructions (Addendum)
You have been scheduled for an endoscopy. Please follow written instructions given to you at your visit today. If you use inhalers (even only as needed), please bring them with you on the day of your procedure.  We have sent the following medications to your pharmacy for you to pick up at your convenience: Pantoprazole  You will be contacted by our office prior to your procedure for directions on holding your Plavix.  If you do not hear from our office 1 week prior to your scheduled procedure, please call [phone] to discuss.    I appreciate the opportunity to care for you. Silvano Rusk, MD, [facility]
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
## 2018-07-31 NOTE — Telephone Encounter (Signed)
OK to hold Plavix 5 days prior to EGD and esophageal dilatation. Coronary Stent placed 05/27/17.  Candee Furbish, MD

## 2018-08-01 NOTE — Telephone Encounter (Signed)
Spoke with patient per Dr. Marlou Porch office, patient ok to hold Plavix 5 days before procedure. Patient infromed and verbalized understanding, knows to resume Plavix per his discharge instructions after procedure.

## 2018-08-01 NOTE — Telephone Encounter (Signed)
   Primary Cardiologist: Candee Furbish, MD  Chart reviewed as part of pre-operative protocol coverage.  [facility] has requesting guidance for holding plavix prior to EDG, not medical clearance.   Per Dr. Marlou Porch, [place] to hold plavix for 5 days prior to EGD and esophageal dilation. Coronary stent placed 05/27/17.  I will route this recommendation to the requesting party via Epic fax function and remove from pre-op pool.  Please call with questions.  Tami Lin Shequita Peplinski, PA 08/01/2018, 7:22 AM

## 2018-08-10 ENCOUNTER — Telehealth: Payer: Self-pay | Admitting: *Deleted

## 2018-08-10 NOTE — Telephone Encounter (Signed)
Covid-19 travel screening questions  Have you traveled in the last 14 days? no If yes where?  Do you now or have you had a fever in the last 14 days? no  Do you have any respiratory symptoms of shortness of breath or cough now or in the last 14 days? n0  Do you have any family members or close contacts with diagnosed or suspected Covid-19? no

## 2018-08-11 ENCOUNTER — Encounter: Payer: Self-pay | Admitting: Gastroenterology

## 2018-08-11 ENCOUNTER — Other Ambulatory Visit: Payer: Self-pay

## 2018-08-11 ENCOUNTER — Ambulatory Visit (AMBULATORY_SURGERY_CENTER): Payer: Medicare HMO | Admitting: Gastroenterology

## 2018-08-11 VITALS — BP 101/55 | HR 65 | Temp 97.6°F | Resp 19 | Ht 71.5 in | Wt 225.0 lb

## 2018-08-11 DIAGNOSIS — R1310 Dysphagia, unspecified: Secondary | ICD-10-CM | POA: Diagnosis not present

## 2018-08-11 DIAGNOSIS — K210 Gastro-esophageal reflux disease with esophagitis: Secondary | ICD-10-CM | POA: Diagnosis not present

## 2018-08-11 DIAGNOSIS — K3189 Other diseases of stomach and duodenum: Secondary | ICD-10-CM | POA: Diagnosis not present

## 2018-08-11 DIAGNOSIS — K2270 Barrett's esophagus without dysplasia: Secondary | ICD-10-CM | POA: Diagnosis not present

## 2018-08-11 DIAGNOSIS — Z8719 Personal history of other diseases of the digestive system: Secondary | ICD-10-CM

## 2018-08-11 DIAGNOSIS — R1319 Other dysphagia: Secondary | ICD-10-CM

## 2018-08-11 DIAGNOSIS — K449 Diaphragmatic hernia without obstruction or gangrene: Secondary | ICD-10-CM | POA: Diagnosis not present

## 2018-08-11 MED ORDER — SODIUM CHLORIDE 0.9 % IV SOLN: 500.0000 mL | Freq: Once | INTRAVENOUS | Status: DC

## 2018-08-11 NOTE — Progress Notes (Signed)
To PACU, VSS. Report to [email or web address] 

## 2018-08-11 NOTE — Op Note (Signed)
[facility] Patient Name: William Norton Procedure Date: 08/11/2018 1:57 PM MRN: [id number] Endoscopist: Mallie Mussel L. Danis , MD Age: 73 Referring MD:  Date of Birth: 01-04-1946 Gender: Male Account #: [id number] Procedure:                Upper GI endoscopy Indications:              Oropharyngeal phase dysphagia, Follow-up of                            Barrett's esophagus (non-dysplastic 2016), Abnormal                            cine-esophagram (MBS showing transient delay                            passage of barium tablet at UES)                           Patient reports symptoms lately improved after                            resuming PPI                           Of note, patient's diabetes under suboptimal                            control and is on Rexulti Medicines:                Monitored Anesthesia Care Procedure:                Pre-Anesthesia Assessment:                           - Prior to the procedure, a History and Physical                            was performed, and patient medications and                            allergies were reviewed. The patient's tolerance of                            previous anesthesia was also reviewed. The risks                            and benefits of the procedure and the sedation                            options and risks were discussed with the patient.                            All questions were answered, and informed consent                            was obtained.  Prior Anticoagulants: The patient has                            taken Plavix (clopidogrel), last dose was 5 days                            prior to procedure. ASA Grade Assessment: III - A                            patient with severe systemic disease. After                            reviewing the risks and benefits, the patient was                            deemed in satisfactory condition to undergo the                            procedure.                   After obtaining informed consent, the endoscope was                            passed under direct vision. Throughout the                            procedure, the patient's blood pressure, pulse, and                            oxygen saturations were monitored continuously. The                            Endoscope was introduced through the mouth, and                            advanced to the second part of duodenum. The upper                            GI endoscopy was accomplished without difficulty.                            The patient tolerated the procedure well. Scope In: Scope Out: Findings:                 The larynx was normal except for minimal secretions                            and mucosal edema.                           A 6 cm hiatal hernia was present (36-42 cm fro                            teeth).  The lower third of the esophagus was tortuous.                           LA Grade A (one or more mucosal breaks less than 5                            mm, not extending between tops of 2 mucosal folds)                            esophagitis with linear erythema was found in the                            lower third of the esophagus.                           There were esophageal mucosal changes secondary to                            established short-segment Barrett's disease present                            at the gastroesophageal junction. The maximum                            longitudinal extent of these mucosal changes was 2                            cm in length. This was biopsied with a cold forceps                            for histology. There were no raised or otherwise                            suspicious areas within the Barrett's mucosa.                           The stomach was normal.                           The cardia and gastric fundus were normal on                            retroflexion.                            The examined duodenum was normal. Complications:            No immediate complications. Estimated Blood Loss:     Estimated blood loss was minimal. Impression:               - Normal larynx.                           - 6 cm hiatal hernia.                           -  Tortuous esophagus.                           - LA Grade A reflux esophagitis.                           - Esophageal mucosal changes secondary to                            established short-segment Barrett's disease.                            Biopsied.                           - Normal stomach.                           - Normal examined duodenum.                           Dysphagia appears to be a combination of                            reflux-related esophageal dysmotility, hiatal                            hernia with tortuosity of distal esophagus,                            probable imcomplete UES relaxation. Recommendation:           - Patient has a contact number available for                            emergencies. The signs and symptoms of potential                            delayed complications were discussed with the                            patient. Return to normal activities tomorrow.                            Written discharge instructions were provided to the                            patient.                           - Resume previous diet.                           - Resume Plavix (clopidogrel) at prior dose                            tomorrow.                           -  Await pathology results. Further plans for                            treatment and follow up with Dr. Carlean Purl. [name] L. Loletha Carrow, MD 08/11/2018 2:22:07 PM This report has been signed electronically.

## 2018-08-11 NOTE — Progress Notes (Signed)
Pt reported he thinks his last dose of Plavix was July 27, 2018.  He said he ran out of PLAVIX.  I advised him he needed to either contact his pharmacy or MD that he should not take his self off a blood thinning medications without MD's advise.  maw

## 2018-08-11 NOTE — Progress Notes (Signed)
Called to room to assist during endoscopic procedure.  Patient ID and intended procedure confirmed with present staff. Received instructions for my participation in the procedure from the performing physician.  

## 2018-08-11 NOTE — Patient Instructions (Signed)
Information given to you today on Barrett's Esophagus and Hiatal Hernias.  Await pathology results.  Resume Plavix at previous dose tomorrow.  YOU HAD AN ENDOSCOPIC PROCEDURE TODAY AT [facility]:   Refer to the procedure report that was given to you for any specific questions about what was found during the examination.  If the procedure report does not answer your questions, please call your gastroenterologist to clarify.  If you requested that your care partner not be given the details of your procedure findings, then the procedure report has been included in a sealed envelope for you to review at your convenience later.  YOU SHOULD EXPECT: Some feelings of bloating in the abdomen. Passage of more gas than usual.  Walking can help get rid of the air that was put into your GI tract during the procedure and reduce the bloating. If you had a lower endoscopy (such as a colonoscopy or flexible sigmoidoscopy) you may notice spotting of blood in your stool or on the toilet paper. If you underwent a bowel prep for your procedure, you may not have a normal bowel movement for a few days.  Please Note:  You might notice some irritation and congestion in your nose or some drainage.  This is from the oxygen used during your procedure.  There is no need for concern and it should clear up in a day or so.  SYMPTOMS TO REPORT IMMEDIATELY:    Following upper endoscopy (EGD)  Vomiting of blood or coffee ground material  New chest pain or pain under the shoulder blades  Painful or persistently difficult swallowing  New shortness of breath  Fever of 100F or higher  Black, tarry-looking stools  For urgent or emergent issues, a gastroenterologist can be reached at any hour by calling [phone].   DIET:  We do recommend a small meal at first, but then you may proceed to your regular diet.  Drink plenty of fluids but you should avoid alcoholic beverages for 24 hours.  ACTIVITY:  You  should plan to take it easy for the rest of today and you should NOT DRIVE or use heavy machinery until tomorrow (because of the sedation medicines used during the test).    FOLLOW UP: Our staff will call the number listed on your records the next business day following your procedure to check on you and address any questions or concerns that you may have regarding the information given to you following your procedure. If we do not reach you, we will leave a message.  However, if you are feeling well and you are not experiencing any problems, there is no need to return our call.  We will assume that you have returned to your regular daily activities without incident.  If any biopsies were taken you will be contacted by phone or by letter within the next 1-3 weeks.  Please call us at [phone] if you have not heard about the biopsies in 3 weeks.    SIGNATURES/CONFIDENTIALITY: You and/or your care partner have signed paperwork which will be entered into your electronic medical record.  These signatures attest to the fact that that the information above on your After Visit Summary has been reviewed and is understood.  Full responsibility of the confidentiality of this discharge information lies with you and/or your care-partner.

## 2018-08-12 ENCOUNTER — Telehealth: Payer: Self-pay | Admitting: *Deleted

## 2018-08-12 NOTE — Telephone Encounter (Signed)
  Follow up Call-  Call back number 08/11/2018  Post procedure Call Back phone  # [phone] cell  Permission to leave phone message Yes  Some recent data might be hidden     Patient questions:  Do you have a fever, pain , or abdominal swelling? no Pain Score  0  Have you tolerated food without any problems? yes  Have you been able to return to your normal activities? yes  Do you have any questions about your discharge instructions: Diet   no Medications  no Follow up visit  no  Do you have questions or concerns about your Care? no  Actions: * If pain score is 4 or above:

## 2018-08-17 ENCOUNTER — Encounter: Payer: Self-pay | Admitting: Gastroenterology

## 2018-08-29 ENCOUNTER — Other Ambulatory Visit: Payer: Self-pay

## 2018-08-29 ENCOUNTER — Ambulatory Visit (INDEPENDENT_AMBULATORY_CARE_PROVIDER_SITE_OTHER): Payer: Medicare HMO | Admitting: Internal Medicine

## 2018-08-29 DIAGNOSIS — E119 Type 2 diabetes mellitus without complications: Secondary | ICD-10-CM

## 2018-08-29 DIAGNOSIS — I10 Essential (primary) hypertension: Secondary | ICD-10-CM

## 2018-08-29 DIAGNOSIS — I2510 Atherosclerotic heart disease of native coronary artery without angina pectoris: Secondary | ICD-10-CM | POA: Diagnosis not present

## 2018-08-29 DIAGNOSIS — K2270 Barrett's esophagus without dysplasia: Secondary | ICD-10-CM

## 2018-08-29 DIAGNOSIS — R972 Elevated prostate specific antigen [PSA]: Secondary | ICD-10-CM | POA: Diagnosis not present

## 2018-08-29 DIAGNOSIS — M1990 Unspecified osteoarthritis, unspecified site: Secondary | ICD-10-CM

## 2018-08-29 LAB — PSA: PSA: 4.1

## 2018-08-29 MED ORDER — CELECOXIB 100 MG PO CAPS: 100.0000 mg | ORAL_CAPSULE | Freq: Every day | ORAL | 1 refills | Status: DC

## 2018-08-29 MED ORDER — CLOPIDOGREL BISULFATE 75 MG PO TABS: 75.0000 mg | ORAL_TABLET | Freq: Every day | ORAL | 2 refills | Status: DC
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
## 2018-08-30 NOTE — Assessment & Plan Note (Signed)
DM: On metformin metformin, last A1c 9.8.  Needs better control.  Room for improvement on his diet.  Up until last year, A1c was 7.0 on diet only. Plan: Check a A1c HTN: Continue losartan, metoprolol.  Check a BMP CAD: Asymptomatic: Continue aspirin, Lipitor, refill Plavix.  Last visit with cardiology 09/2017, schedule a referral Barrett's esophagus: Had a EGD 07/2018, tortuous esophagus, BX Barrett's without dysplasia.  On Protonix, no symptoms DJD: See previous office visit  "not feeling well" comments. Continue with pain, today report is mostly at the back and legs, usually in the mornings.  No fever chills or headache.  Feels he was doing better with Celebrex 100 mg rather than 50 mg.  Will switch back to 100 mg daily, watch for resurface of GI symptoms. Of note previously ANA was + for blood but has failed rheumatology referrals.  Symptoms could be due to spinal stenosis. Reassess on RTC Depression, anxiety: Unable to afford Rexulti, doing okay on Abilify-lexapro BPH: Unable to afford Toviaz or Myrbetriq. Pr will discuss with urology Plan: Labs, cardiology referral, RTC 3 months, CPX

## 2018-08-31 ENCOUNTER — Encounter: Payer: Self-pay | Admitting: Internal Medicine

## 2018-08-31 DIAGNOSIS — R69 Illness, unspecified: Secondary | ICD-10-CM | POA: Diagnosis not present

## 2018-08-31 MED ORDER — GLUCOSE BLOOD VI STRP: ORAL_STRIP | 12 refills | Status: DC

## 2018-08-31 MED ORDER — ONETOUCH ULTRASOFT LANCETS MISC: 12 refills | Status: DC

## 2018-09-01 ENCOUNTER — Other Ambulatory Visit: Payer: Self-pay

## 2018-09-01 ENCOUNTER — Other Ambulatory Visit (INDEPENDENT_AMBULATORY_CARE_PROVIDER_SITE_OTHER): Payer: Medicare HMO

## 2018-09-01 DIAGNOSIS — I10 Essential (primary) hypertension: Secondary | ICD-10-CM | POA: Diagnosis not present

## 2018-09-01 DIAGNOSIS — E119 Type 2 diabetes mellitus without complications: Secondary | ICD-10-CM | POA: Diagnosis not present

## 2018-09-01 LAB — BASIC METABOLIC PANEL
BUN: 18 mg/dL (ref 6–23)
CO2: 23 mEq/L (ref 19–32)
Calcium: 9.3 mg/dL (ref 8.4–10.5)
Chloride: 103 mEq/L (ref 96–112)
Creatinine, Ser: 0.86 mg/dL (ref 0.40–1.50)
GFR: 86.92 mL/min (ref >60.00)
Glucose, Bld: 116 mg/dL — ABNORMAL HIGH (ref 70–99)
Potassium: 4.5 mEq/L (ref 3.5–5.1)
Sodium: 137 mEq/L (ref 135–145)

## 2018-09-01 LAB — HEMOGLOBIN A1C: Hgb A1c MFr Bld: 6.9 % — ABNORMAL HIGH (ref 4.6–6.5)

## 2018-09-07 ENCOUNTER — Telehealth: Payer: Self-pay

## 2018-09-07 NOTE — Telephone Encounter (Signed)
YOUR CARDIOLOGY TEAM HAS ARRANGED FOR AN E-VISIT FOR YOUR APPOINTMENT - PLEASE REVIEW IMPORTANT INFORMATION BELOW SEVERAL DAYS PRIOR TO YOUR APPOINTMENT  Due to the recent COVID-19 pandemic, we are transitioning in-person office visits to tele-medicine visits in an effort to decrease unnecessary exposure to our patients, their families, and staff. These visits are billed to your insurance just like a normal visit is. We also encourage you to sign up for MyChart if you have not already done so. You will need a smartphone if possible. For patients that do not have this, we can still complete the visit using a regular telephone but do prefer a smartphone to enable video when possible. You may have a family member that lives with you that can help. If possible, we also ask that you have a blood pressure cuff and scale at home to measure your blood pressure, heart rate and weight prior to your scheduled appointment. Patients with clinical needs that need an in-person evaluation and testing will still be able to come to the office if absolutely necessary. If you have any questions, feel free to call our office.     YOUR PROVIDER WILL BE USING THE FOLLOWING PLATFORM TO COMPLETE YOUR VISIT: [email or web address]  . IF USING MYCHART - How to Download the MyChart App to Your SmartPhone   - If Apple, go to App Store and type in MyChart in the search bar and download the app. If Android, ask patient to go to Google Play Store and type in MyChart in the search bar and download the app. The app is free but as with any other app downloads, your phone may require you to verify saved payment information or Apple/Android password.  - You will need to then log into the app with your MyChart username and password, and select [facility] as your healthcare provider to link the account.  - When it is time for your visit, go to the MyChart app, find appointments, and click Begin Video Visit. Be sure to Select Allow for your device to  access the Microphone and Camera for your visit. You will then be connected, and your provider will be with you shortly.  **If you have any issues connecting or need assistance, please contact MyChart service desk [phone] ([phone])**  **If using a computer, in order to ensure the best quality for your visit, you will need to use either of the following Internet Browsers: Google Chrome or Microsoft Edge**  . IF USING DOXIMITY or [email or web address] - The staff will give you instructions on receiving your link to join the meeting the day of your visit.      2-3 DAYS BEFORE YOUR APPOINTMENT  You will receive a telephone call from one of our [facility] team members - your caller ID may say "Unknown caller." If this is a video visit, we will walk you through how to get the video launched on your phone. We will remind you check your blood pressure, heart rate and weight prior to your scheduled appointment. If you have an Apple Watch or Kardia, please upload any pertinent ECG strips the day before or morning of your appointment to MyChart. Our staff will also make sure you have reviewed the consent and agree to move forward with your scheduled tele-health visit.     THE DAY OF YOUR APPOINTMENT  Approximately 15 minutes prior to your scheduled appointment, you will receive a telephone call from one of [facility] team - your caller ID may say "Unknown caller."    Our staff will confirm medications, vital signs for the day and any symptoms you may be experiencing. Please have this information available prior to the time of visit start. It may also be helpful for you to have a pad of paper and pen handy for any instructions given during your visit. They will also walk you through joining the smartphone meeting if this is a video visit.    CONSENT FOR TELE-HEALTH VISIT - PLEASE REVIEW  I hereby voluntarily request, consent and authorize [facility] and its employed or contracted physicians, physician  assistants, nurse practitioners or other licensed health care professionals (the Practitioner), to provide me with telemedicine health care services (the "Services") as deemed necessary by the treating Practitioner. I acknowledge and consent to receive the Services by the Practitioner via telemedicine. I understand that the telemedicine visit will involve communicating with the Practitioner through live audiovisual communication technology and the disclosure of certain medical information by electronic transmission. I acknowledge that I have been given the opportunity to request an in-person assessment or other available alternative prior to the telemedicine visit and am voluntarily participating in the telemedicine visit.  I understand that I have the right to withhold or withdraw my consent to the use of telemedicine in the course of my care at any time, without affecting my right to future care or treatment, and that the Practitioner or I may terminate the telemedicine visit at any time. I understand that I have the right to inspect all information obtained and/or recorded in the course of the telemedicine visit and may receive copies of available information for a reasonable fee.  I understand that some of the potential risks of receiving the Services via telemedicine include:  . Delay or interruption in medical evaluation due to technological equipment failure or disruption; . Information transmitted may not be sufficient (e.g. poor resolution of images) to allow for appropriate medical decision making by the Practitioner; and/or  . In rare instances, security protocols could fail, causing a breach of personal health information.  Furthermore, I acknowledge that it is my responsibility to provide information about my medical history, conditions and care that is complete and accurate to the best of my ability. I acknowledge that Practitioner's advice, recommendations, and/or decision may be based on  factors not within their control, such as incomplete or inaccurate data provided by me or distortions of diagnostic images or specimens that may result from electronic transmissions. I understand that the practice of medicine is not an exact science and that Practitioner makes no warranties or guarantees regarding treatment outcomes. I acknowledge that I will receive a copy of this consent concurrently upon execution via email to the email address I last provided but may also request a printed copy by calling the office of [facility].    I understand that my insurance will be billed for this visit.   I have read or had this consent read to me. . I understand the contents of this consent, which adequately explains the benefits and risks of the Services being provided via telemedicine.  . I have been provided ample opportunity to ask questions regarding this consent and the Services and have had my questions answered to my satisfaction. . I give my informed consent for the services to be provided through the use of telemedicine in my medical care  By participating in this telemedicine visit I agree to the above.  

## 2018-09-08 ENCOUNTER — Telehealth (INDEPENDENT_AMBULATORY_CARE_PROVIDER_SITE_OTHER): Payer: Medicare HMO | Admitting: Cardiology

## 2018-09-08 ENCOUNTER — Other Ambulatory Visit: Payer: Self-pay

## 2018-09-08 ENCOUNTER — Encounter: Payer: Self-pay | Admitting: Cardiology

## 2018-09-08 VITALS — Ht 71.5 in | Wt 225.0 lb

## 2018-09-08 DIAGNOSIS — I2510 Atherosclerotic heart disease of native coronary artery without angina pectoris: Secondary | ICD-10-CM

## 2018-09-08 DIAGNOSIS — I10 Essential (primary) hypertension: Secondary | ICD-10-CM

## 2018-09-08 DIAGNOSIS — Z955 Presence of coronary angioplasty implant and graft: Secondary | ICD-10-CM

## 2018-09-08 DIAGNOSIS — R0602 Shortness of breath: Secondary | ICD-10-CM

## 2018-09-08 MED ORDER — METOPROLOL SUCCINATE ER 25 MG PO TB24: 25.0000 mg | ORAL_TABLET | Freq: Every day | ORAL | 3 refills | Status: DC

## 2018-09-08 NOTE — Patient Instructions (Signed)
Medication Instructions:  You may discontinue your Plavix.  Continue all other medications as listed.  If you need a refill on your cardiac medications before your next appointment, please call your pharmacy.   Follow-Up: Follow up in 1 year with Dr. Marlou Porch.  You will receive a letter in the mail 2 months before you are due.  Please call us when you receive this letter to schedule your follow up appointment.  Thank you for choosing [facility]!!
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
## 2018-10-11 DIAGNOSIS — F411 Generalized anxiety disorder: Secondary | ICD-10-CM | POA: Diagnosis not present

## 2018-10-11 DIAGNOSIS — R69 Illness, unspecified: Secondary | ICD-10-CM | POA: Diagnosis not present

## 2018-10-23 DIAGNOSIS — R69 Illness, unspecified: Secondary | ICD-10-CM | POA: Diagnosis not present

## 2018-10-26 ENCOUNTER — Other Ambulatory Visit: Payer: Self-pay

## 2018-10-26 ENCOUNTER — Ambulatory Visit: Payer: Medicare HMO | Admitting: *Deleted

## 2018-10-26 ENCOUNTER — Encounter: Payer: Self-pay | Admitting: Internal Medicine

## 2018-10-26 ENCOUNTER — Ambulatory Visit (INDEPENDENT_AMBULATORY_CARE_PROVIDER_SITE_OTHER): Payer: Medicare HMO | Admitting: Internal Medicine

## 2018-10-26 VITALS — BP 153/84 | HR 67 | Temp 98.0°F | Resp 16 | Ht 71.5 in | Wt 230.0 lb

## 2018-10-26 DIAGNOSIS — R9389 Abnormal findings on diagnostic imaging of other specified body structures: Secondary | ICD-10-CM

## 2018-10-26 DIAGNOSIS — Z0000 Encounter for general adult medical examination without abnormal findings: Secondary | ICD-10-CM | POA: Diagnosis not present

## 2018-10-26 MED ORDER — SHINGRIX 50 MCG/0.5ML IM SUSR: 0.5000 mL | Freq: Once | INTRAMUSCULAR | 1 refills | Status: AC
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
## 2018-10-26 NOTE — Progress Notes (Signed)
Pre visit review using our clinic review tool, if applicable. No additional management support is needed unless otherwise documented below in the visit note. 

## 2018-10-26 NOTE — Assessment & Plan Note (Addendum)
--  Td 2018  -PNM 23: 06-09-10 -prevnar 10-15 -Shingles Rx printed  -Flu shot recommended earlier this year --CCS: cscope 2016, repeat 2021 --Prostate cancer screening:  Last PSA June 2019 was 4.3, subsequently saw urology and had extensive discussion about it --Lung cancer screening: had a HRCT 09/2017, reorder CT --diet, exercise: Discussed --labs: Reviewed
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
## 2018-10-26 NOTE — Patient Instructions (Signed)
  GO TO THE FRONT DESK Schedule your next appointment   for a follow-up in 6 months  Get a early flu shot this season  Please proceed with Shingrix at your convenience  We will schedule a CAT scan of your chest

## 2018-10-27 ENCOUNTER — Encounter: Payer: Self-pay | Admitting: *Deleted

## 2018-10-27 ENCOUNTER — Ambulatory Visit (INDEPENDENT_AMBULATORY_CARE_PROVIDER_SITE_OTHER): Payer: Medicare HMO | Admitting: *Deleted

## 2018-10-27 DIAGNOSIS — Z0000 Encounter for general adult medical examination without abnormal findings: Secondary | ICD-10-CM | POA: Diagnosis not present

## 2018-10-27 NOTE — Patient Instructions (Signed)
Please schedule your next medicare wellness visit with me in 1 yr.  Continue to eat heart healthy diet (full of fruits, vegetables, whole grains, lean protein, water--limit salt, fat, and sugar intake) and increase physical activity as tolerated.  Continue doing brain stimulating activities (puzzles, reading, adult coloring books, staying active) to keep memory sharp.    William Norton , Thank you for taking time to come for your Medicare Wellness Visit. I appreciate your ongoing commitment to your health goals. Please review the following plan we discussed and let me know if I can assist you in the future.   These are the goals we discussed: Goals    . DIET - EAT MORE FRUITS AND VEGETABLES    . Drink 5 glasses of water per day. (pt-stated)    . exercise 3x/ week (pt-stated)       This is a list of the screening recommended for you and due dates:  Health Maintenance  Topic Date Due  . Eye exam for diabetics  08/15/1954  . Complete foot exam   01/22/2018  . Flu Shot  11/26/2018  . Hemoglobin A1C  03/04/2019  . Colon Cancer Screening  09/14/2019  . Tetanus Vaccine  10/21/2026  .  Hepatitis C: One time screening is recommended by Center for Disease Control  (CDC) for  adults born from [birth year] through 1966.   Completed  . Pneumonia vaccines  Completed    Health Maintenance After Age 17 After age 30, you are at a higher risk for certain long-term diseases and infections as well as injuries from falls. Falls are a major cause of broken bones and head injuries in people who are older than age 65. Getting regular preventive care can help to keep you healthy and well. Preventive care includes getting regular testing and making lifestyle changes as recommended by your health care provider. Talk with your health care provider about:  Which screenings and tests you should have. A screening is a test that checks for a disease when you have no symptoms.  A diet and exercise plan that is right for you.  What should I know about screenings and tests to prevent falls? Screening and testing are the best ways to find a health problem early. Early diagnosis and treatment give you the best chance of managing medical conditions that are common after age 32. Certain conditions and lifestyle choices may make you more likely to have a fall. Your health care provider may recommend:  Regular vision checks. Poor vision and conditions such as cataracts can make you more likely to have a fall. If you wear glasses, make sure to get your prescription updated if your vision changes.  Medicine review. Work with your health care provider to regularly review all of the medicines you are taking, including over-the-counter medicines. Ask your health care provider about any side effects that may make you more likely to have a fall. Tell your health care provider if any medicines that you take make you feel dizzy or sleepy.  Osteoporosis screening. Osteoporosis is a condition that causes the bones to get weaker. This can make the bones weak and cause them to break more easily.  Blood pressure screening. Blood pressure changes and medicines to control blood pressure can make you feel dizzy.  Strength and balance checks. Your health care provider may recommend certain tests to check your strength and balance while standing, walking, or changing positions.  Foot health exam. Foot pain and numbness, as well as not  wearing proper footwear, can make you more likely to have a fall.  Depression screening. You may be more likely to have a fall if you have a fear of falling, feel emotionally low, or feel unable to do activities that you used to do.  Alcohol use screening. Using too much alcohol can affect your balance and may make you more likely to have a fall. What actions can I take to lower my risk of falls? General instructions  Talk with your health care provider about your risks for falling. Tell your health care  provider if: ? You fall. Be sure to tell your health care provider about all falls, even ones that seem minor. ? You feel dizzy, sleepy, or off-balance.  Take over-the-counter and prescription medicines only as told by your health care provider. These include any supplements.  Eat a healthy diet and maintain a healthy weight. A healthy diet includes low-fat dairy products, low-fat (lean) meats, and fiber from whole grains, beans, and lots of fruits and vegetables. Home safety  Remove any tripping hazards, such as rugs, cords, and clutter.  Install safety equipment such as grab bars in bathrooms and safety rails on stairs.  Keep rooms and walkways well-lit. Activity   Follow a regular exercise program to stay fit. This will help you maintain your balance. Ask your health care provider what types of exercise are appropriate for you.  If you need a cane or walker, use it as recommended by your health care provider.  Wear supportive shoes that have nonskid soles. Lifestyle  Do not drink alcohol if your health care provider tells you not to drink.  If you drink alcohol, limit how much you have: ? 0-1 drink a day for women. ? 0-2 drinks a day for men.  Be aware of how much alcohol is in your drink. In the [place] one drink equals one typical bottle of beer (12 oz), one-half glass of wine (5 oz), or one shot of hard liquor (1 oz).  Do not use any products that contain nicotine or tobacco, such as cigarettes and e-cigarettes. If you need help quitting, ask your health care provider. Summary  Having a healthy lifestyle and getting preventive care can help to protect your health and wellness after age 67.  Screening and testing are the best way to find a health problem early and help you avoid having a fall. Early diagnosis and treatment give you the best chance for managing medical conditions that are more common for people who are older than age 77.  Falls are a major cause of broken  bones and head injuries in people who are older than age 70. Take precautions to prevent a fall at home.  Work with your health care provider to learn what changes you can make to improve your health and wellness and to prevent falls. This information is not intended to replace advice given to you by your health care provider. Make sure you discuss any questions you have with your health care provider. Document Released: 02/24/2017 Document Revised: 08/04/2018 Document Reviewed: 02/24/2017 Elsevier Patient Education  2020 [employer].

## 2018-10-28 NOTE — Assessment & Plan Note (Signed)
DM: Ambulatory CBGs 110, 120, last A1c 6.9, continue metformin HTN: Continue losartan, metoprolol, check ambulatory BPs Depression anxiety: Currently controlled CAD: Saw cardiology 09/08/2018, he was doing well, they recommend to stop Plavix and continue aspirin lifelong. He did report shortness of breath.  No for evaluation was recommended. Mild anemia: Last hemoglobin stable, recheck on RTC OSA: Intolerant to CPAP "Not feeling well": Unchanged, probably multifactorial.  Work-up in the past including + ANA, he never got to to see rheumatology but currently with no red flag symptoms such as headache, jaw claudication or weight loss.  Observation for now BPH: Continue nocturia, unable to treat due to cost of medicines Abnormal CT chest: Last seen by pulmonary last year, CT show peribronchial groundglass opacities.  Recheck a HR-CT.  Consider asking pulmonary to reassess RTC 6 months.

## 2018-11-01 ENCOUNTER — Ambulatory Visit (HOSPITAL_BASED_OUTPATIENT_CLINIC_OR_DEPARTMENT_OTHER)
Admission: RE | Admit: 2018-11-01 | Discharge: 2018-11-01 | Disposition: A | Discharge status: Home / Self Care | Payer: Medicare HMO | Source: Ambulatory Visit | Attending: Internal Medicine | Admitting: Internal Medicine

## 2018-11-01 ENCOUNTER — Other Ambulatory Visit: Payer: Self-pay

## 2018-11-01 DIAGNOSIS — R9389 Abnormal findings on diagnostic imaging of other specified body structures: Secondary | ICD-10-CM | POA: Diagnosis not present

## 2018-11-01 DIAGNOSIS — J189 Pneumonia, unspecified organism: Secondary | ICD-10-CM | POA: Diagnosis not present

## 2018-11-05 ENCOUNTER — Emergency Department (HOSPITAL_COMMUNITY)
Admission: EM | Admit: 2018-11-05 | Discharge: 2018-11-05 | Disposition: A | Discharge status: Home / Self Care | Payer: Medicare HMO | Attending: Emergency Medicine | Admitting: Emergency Medicine

## 2018-11-05 ENCOUNTER — Encounter (HOSPITAL_COMMUNITY): Payer: Self-pay | Admitting: *Deleted

## 2018-11-05 ENCOUNTER — Emergency Department (HOSPITAL_COMMUNITY): Payer: Medicare HMO

## 2018-11-05 ENCOUNTER — Other Ambulatory Visit: Payer: Self-pay

## 2018-11-05 DIAGNOSIS — Z7984 Long term (current) use of oral hypoglycemic drugs: Secondary | ICD-10-CM | POA: Insufficient documentation

## 2018-11-05 DIAGNOSIS — E119 Type 2 diabetes mellitus without complications: Secondary | ICD-10-CM | POA: Diagnosis not present

## 2018-11-05 DIAGNOSIS — R6889 Other general symptoms and signs: Secondary | ICD-10-CM | POA: Insufficient documentation

## 2018-11-05 DIAGNOSIS — I2510 Atherosclerotic heart disease of native coronary artery without angina pectoris: Secondary | ICD-10-CM | POA: Diagnosis not present

## 2018-11-05 DIAGNOSIS — R4182 Altered mental status, unspecified: Secondary | ICD-10-CM | POA: Insufficient documentation

## 2018-11-05 DIAGNOSIS — R1310 Dysphagia, unspecified: Secondary | ICD-10-CM | POA: Diagnosis not present

## 2018-11-05 DIAGNOSIS — R29810 Facial weakness: Secondary | ICD-10-CM | POA: Diagnosis not present

## 2018-11-05 DIAGNOSIS — R42 Dizziness and giddiness: Secondary | ICD-10-CM | POA: Diagnosis not present

## 2018-11-05 DIAGNOSIS — R4701 Aphasia: Secondary | ICD-10-CM | POA: Diagnosis not present

## 2018-11-05 DIAGNOSIS — Z96641 Presence of right artificial hip joint: Secondary | ICD-10-CM | POA: Insufficient documentation

## 2018-11-05 DIAGNOSIS — R413 Other amnesia: Secondary | ICD-10-CM | POA: Diagnosis not present

## 2018-11-05 DIAGNOSIS — Z79899 Other long term (current) drug therapy: Secondary | ICD-10-CM | POA: Diagnosis not present

## 2018-11-05 DIAGNOSIS — Z96651 Presence of right artificial knee joint: Secondary | ICD-10-CM | POA: Diagnosis not present

## 2018-11-05 DIAGNOSIS — Z7982 Long term (current) use of aspirin: Secondary | ICD-10-CM | POA: Insufficient documentation

## 2018-11-05 DIAGNOSIS — I10 Essential (primary) hypertension: Secondary | ICD-10-CM | POA: Diagnosis not present

## 2018-11-05 DIAGNOSIS — R531 Weakness: Secondary | ICD-10-CM | POA: Diagnosis not present

## 2018-11-05 LAB — CBC WITH DIFFERENTIAL/PLATELET
Abs Immature Granulocytes: 0.02 10*3/uL (ref 0.00–0.07)
Basophils Absolute: 0.1 10*3/uL (ref 0.0–0.1)
Basophils Relative: 1 %
Eosinophils Absolute: 0.2 10*3/uL (ref 0.0–0.5)
Eosinophils Relative: 3 %
HCT: 41.9 % (ref 39.0–52.0)
Hemoglobin: 13.1 g/dL (ref 13.0–17.0)
Immature Granulocytes: 0 %
Lymphocytes Relative: 36 %
Lymphs Abs: 2.4 10*3/uL (ref 0.7–4.0)
MCH: 24.9 pg — ABNORMAL LOW (ref 26.0–34.0)
MCHC: 31.3 g/dL (ref 30.0–36.0)
MCV: 79.7 fL — ABNORMAL LOW (ref 80.0–100.0)
Monocytes Absolute: 0.7 10*3/uL (ref 0.1–1.0)
Monocytes Relative: 11 %
Neutro Abs: 3.4 10*3/uL (ref 1.7–7.7)
Neutrophils Relative %: 49 %
Platelets: 305 10*3/uL (ref 150–400)
RBC: 5.26 MIL/uL (ref 4.22–5.81)
RDW: 16.7 % — ABNORMAL HIGH (ref 11.5–15.5)
WBC: 6.8 10*3/uL (ref 4.0–10.5)
nRBC: 0 % (ref 0.0–0.2)

## 2018-11-05 LAB — COMPREHENSIVE METABOLIC PANEL
ALT: 47 U/L — ABNORMAL HIGH (ref 0–44)
AST: 52 U/L — ABNORMAL HIGH (ref 15–41)
Albumin: 4.3 g/dL (ref 3.5–5.0)
Alkaline Phosphatase: 55 U/L (ref 38–126)
Anion gap: 12 (ref 5–15)
BUN: 16 mg/dL (ref 8–23)
CO2: 23 mmol/L (ref 22–32)
Calcium: 9.5 mg/dL (ref 8.9–10.3)
Chloride: 104 mmol/L (ref 98–111)
Creatinine, Ser: 1 mg/dL (ref 0.61–1.24)
GFR calc Af Amer: >60 mL/min (ref >60)
GFR calc non Af Amer: >60 mL/min (ref >60)
Glucose, Bld: 96 mg/dL (ref 70–99)
Potassium: 4 mmol/L (ref 3.5–5.1)
Sodium: 139 mmol/L (ref 135–145)
Total Bilirubin: 1.4 mg/dL — ABNORMAL HIGH (ref 0.3–1.2)
Total Protein: 7.9 g/dL (ref 6.5–8.1)

## 2018-11-05 LAB — TROPONIN I (HIGH SENSITIVITY): Troponin I (High Sensitivity): 9 ng/L (ref <18)
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
## 2018-11-05 NOTE — ED Triage Notes (Signed)
PT reported feeling dizzy while walking.

## 2018-11-05 NOTE — ED Triage Notes (Signed)
PT has not returned from MRI dept. Unable to draw trop I at this time

## 2018-11-05 NOTE — Discharge Instructions (Signed)
Follow up with your doctor and neurologist for further evaluation. You may have dementia.   Return to ER if you have worse trouble with memory, weakness, numbness, dizziness

## 2018-11-05 NOTE — ED Triage Notes (Signed)
Per Ems  Wife reported starting Friday 0900 Pt starting having difficulty  Recalling telephone numbers and the names of his childern . This is new for PT . EMS BP 176/104, HR 76, CBG 104, O2 Sats 98 RA.  Pt denies any pain .

## 2018-11-05 NOTE — ED Triage Notes (Signed)
Pt returned from MRI °

## 2018-11-05 NOTE — ED Notes (Signed)
Discharge instructions discussed with pt. Pt. verbalized understanding. Pt to go home with family. signature pad not available.

## 2018-11-06 ENCOUNTER — Telehealth: Payer: Self-pay | Admitting: Internal Medicine

## 2018-11-06 NOTE — Telephone Encounter (Signed)
Arrange ER follow-up, in person preferred but virtual ok

## 2018-11-07 NOTE — Telephone Encounter (Signed)
In person 11/08/18

## 2018-11-08 ENCOUNTER — Other Ambulatory Visit: Payer: Self-pay | Admitting: Cardiology

## 2018-11-08 ENCOUNTER — Ambulatory Visit (INDEPENDENT_AMBULATORY_CARE_PROVIDER_SITE_OTHER): Payer: Medicare HMO | Admitting: Internal Medicine

## 2018-11-08 ENCOUNTER — Other Ambulatory Visit: Payer: Self-pay

## 2018-11-08 ENCOUNTER — Other Ambulatory Visit: Payer: Self-pay | Admitting: Internal Medicine

## 2018-11-08 ENCOUNTER — Encounter: Payer: Self-pay | Admitting: Neurology

## 2018-11-08 ENCOUNTER — Encounter: Payer: Self-pay | Admitting: Internal Medicine

## 2018-11-08 ENCOUNTER — Ambulatory Visit: Payer: Medicare HMO | Admitting: Neurology

## 2018-11-08 VITALS — BP 132/80 | HR 61 | Temp 97.7°F | Resp 18 | Ht 71.5 in | Wt 227.2 lb

## 2018-11-08 DIAGNOSIS — F05 Delirium due to known physiological condition: Secondary | ICD-10-CM | POA: Diagnosis not present

## 2018-11-08 DIAGNOSIS — F329 Major depressive disorder, single episode, unspecified: Secondary | ICD-10-CM | POA: Diagnosis not present

## 2018-11-08 DIAGNOSIS — F419 Anxiety disorder, unspecified: Secondary | ICD-10-CM

## 2018-11-08 DIAGNOSIS — G969 Disorder of central nervous system, unspecified: Secondary | ICD-10-CM | POA: Diagnosis not present

## 2018-11-08 DIAGNOSIS — R29898 Other symptoms and signs involving the musculoskeletal system: Secondary | ICD-10-CM

## 2018-11-08 DIAGNOSIS — F32A Depression, unspecified: Secondary | ICD-10-CM

## 2018-11-08 DIAGNOSIS — R202 Paresthesia of skin: Secondary | ICD-10-CM | POA: Diagnosis not present

## 2018-11-08 DIAGNOSIS — G459 Transient cerebral ischemic attack, unspecified: Secondary | ICD-10-CM | POA: Diagnosis not present

## 2018-11-08 DIAGNOSIS — R69 Illness, unspecified: Secondary | ICD-10-CM | POA: Diagnosis not present

## 2018-11-08 DIAGNOSIS — R251 Tremor, unspecified: Secondary | ICD-10-CM

## 2018-11-08 DIAGNOSIS — R4182 Altered mental status, unspecified: Secondary | ICD-10-CM | POA: Diagnosis not present

## 2018-11-08 DIAGNOSIS — G3184 Mild cognitive impairment, so stated: Secondary | ICD-10-CM

## 2018-11-08 DIAGNOSIS — Z79899 Other long term (current) drug therapy: Secondary | ICD-10-CM | POA: Diagnosis not present

## 2018-11-08 LAB — URINALYSIS, ROUTINE W REFLEX MICROSCOPIC
Bilirubin Urine: NEGATIVE
Hgb urine dipstick: NEGATIVE
Ketones, ur: NEGATIVE
Leukocytes,Ua: NEGATIVE
Nitrite: NEGATIVE
RBC / HPF: NONE SEEN (ref 0–?)
Specific Gravity, Urine: 1.02 (ref 1.000–1.030)
Total Protein, Urine: NEGATIVE
Urine Glucose: NEGATIVE
Urobilinogen, UA: 0.2 (ref 0.0–1.0)
pH: 6 (ref 5.0–8.0)

## 2018-11-08 MED ORDER — CARBIDOPA-LEVODOPA 25-100 MG PO TABS: 0.5000 | ORAL_TABLET | Freq: Three times a day (TID) | ORAL | 2 refills | Status: DC
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
## 2018-11-08 NOTE — Progress Notes (Signed)
Pre visit review using our clinic review tool, if applicable. No additional management support is needed unless otherwise documented below in the visit note. 
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
## 2018-11-08 NOTE — Patient Instructions (Signed)
I had a long discussion with the patient regarding his transient episode of acute confusion disorientation and discussed differential diagnosis including TIA versus seizure.  He also has mild cognitive impairment, resting hand tremors which may suggest early Parkinson's disease as well as chronic hand muscle weakness and paresthesias from his remote cervical spine surgery.  I recommend check lab work for reversible causes of memory loss, lipid panel, MRA of the brain and neck and EEG.  Trial of Sinemet 25/100 half a tablet twice daily for a week to be increased to 3 times daily if tolerated.  He will stay on aspirin for stroke prevention and maintain strict control of hypertension with blood pressure goal below 130/90, lipids with LDL cholesterol goal below 70 mg percent and diabetes with hemoglobin A1c goal below 6.5%.  He was encouraged to eat a healthy diet and to be active and exercise as tolerated regularly.  He will return for follow-up in the future in 2 months or call earlier if necessary.  Confusion Confusion is the inability to think with the usual speed or clarity. People who are confused often describe their thinking as cloudy or unclear. Confusion can also include feeling disoriented. This means you are unaware of where you are or who you are. You may also not know the date or time. When confused, you may have difficulty remembering, paying attention, or making decisions. Some people also act aggressively when they are confused. In some cases, confusion may come on quickly. In other cases, it may develop slowly over time. How quickly confusion comes on depends on the cause. Confusion may be caused by:  Head injury (concussion).  Seizures.  Stroke.  Fever.  Brain tumor.  Decrease in brain function due to a vascular or neurologic condition (dementia).  Emotions, like rage or terror.  Inability to know what is real and what is not (hallucinations).  Infections, such as a urinary  tract infection (UTI).  Using too much alcohol, drugs, or medicines.  Loss of fluid (dehydration) or an imbalance of salts in the body (electrolytes).  Lack of sleep.  Low blood sugar (diabetes).  Low levels of oxygen. This comes from conditions such as chronic lung disorders.  Side effects of medicines, or taking medicines that affect other medicines (drug interactions).  Lack of certain nutrients, especially niacin, thiamine, vitamin C, or vitamin B.  Sudden drop in body temperature (hypothermia).  Change in routine, such as traveling or being hospitalized. Follow these instructions at home: Pay attention to your symptoms. Tell your health care provider about any changes or if you develop new symptoms. Follow these instructions to control or treat symptoms. Ask a family member or friend for help if needed. Medicines  Take over-the-counter and prescription medicines only as told by your health care provider.  Ask your health care provider about changing or stopping any medicines that may be causing your confusion.  Avoid pain medicines or sleep medicines until you have fully recovered.  Use a pillbox or an alarm to help you take the right medicines at the right time. Lifestyle   Eat a balanced diet that includes fruits and vegetables.  Get enough sleep. For most adults, this is 7-9 hours each night.  Do not drink alcohol.  Do not become isolated. Spend time with other people and make plans for your days.  Do not drive until your health care provider says that it is safe to do so.  Do not use any products that contain nicotine or tobacco,  such as cigarettes and e-cigarettes. If you need help quitting, ask your health care provider.  Stop other activities that may increase your chances of getting hurt. These may include some work duties, sports activities, swimming, or bike riding. Ask your health care provider what activities are safe for you. What caregivers can do   Find out if the person is confused. Ask the person to state his or her name, age, and the date. If the person is unsure or answers incorrectly, he or she may be confused.  Always introduce yourself, no matter how well the person knows you.  Remind the person of his or her location. Do this often.  Place a calendar and clock near the person who is confused.  Talk about current events and plans for the day.  Keep the environment calm, quiet, and peaceful.  Help the person do the things that he or she is unable to do. These include: ? Taking medicines. ? Keeping follow-up visits with his or her health care provider. ? Helping with household duties, including meal preparation. ? Running errands.  Get help if you need it. There are several support groups for caregivers.  If the person you are helping needs more support, consider day care, extended care programs, or a skilled nursing facility. The person's health care provider may be able to help evaluate these options. General instructions  Monitor yourself for any conditions you may have. These may include: ? Checking your blood glucose levels, if you have diabetes. ? Watching your weight, if you are overweight. ? Monitoring your blood pressure, if you have hypertension. ? Monitoring your body temperature, if you have a fever.  Keep all follow-up visits as told by your health care provider. This is important. Contact a health care provider if:  Your symptoms get worse. Get help right away if you:  Feel that you are not able to care for yourself.  Develop severe headaches, repeated vomiting, seizures, blackouts, or slurred speech.  Have increasing confusion, weakness, numbness, restlessness, or personality changes.  Develop a loss of balance, have marked dizziness, feel uncoordinated, or fall.  Develop severe anxiety, or you have delusions or hallucinations. These symptoms may represent a serious problem that is an emergency.  Do not wait to see if the symptoms will go away. Get medical help right away. Call your local emergency services (911 in the [place] Do not drive yourself to the hospital. Summary  Confusion is the inability to think with the usual speed or clarity. People who are confused often describe their thinking as cloudy or unclear.  Confusion can also include having difficulty remembering, paying attention, or making decisions.  Confusion may come on quickly or develop slowly over time, depending on the cause. There are many different causes of confusion.  Ask for help from family members or friends if you are unable to take care of yourself. This information is not intended to replace advice given to you by your health care provider. Make sure you discuss any questions you have with your health care provider. Document Released: 05/21/2004 Document Revised: 04/15/2017 Document Reviewed: 04/15/2017 Elsevier Patient Education  2020 [employer].

## 2018-11-08 NOTE — Patient Instructions (Signed)
GO TO THE LAB   GO TO THE FRONT DESK Schedule your next appointment   For a check up in 2-3 months

## 2018-11-09 ENCOUNTER — Telehealth: Payer: Self-pay | Admitting: Neurology

## 2018-11-09 LAB — LIPID PANEL
Chol/HDL Ratio: 2.7 ratio (ref 0.0–5.0)
Cholesterol, Total: 136 mg/dL (ref 100–199)
HDL: 51 mg/dL (ref >39)
LDL Calculated: 44 mg/dL (ref 0–99)
Triglycerides: 203 mg/dL — ABNORMAL HIGH (ref 0–149)
VLDL Cholesterol Cal: 41 mg/dL — ABNORMAL HIGH (ref 5–40)

## 2018-11-09 LAB — DEMENTIA PANEL
Homocysteine: 19.9 umol/L — ABNORMAL HIGH (ref 0.0–19.2)
RPR Ser Ql: NONREACTIVE
TSH: 1.81 u[IU]/mL (ref 0.450–4.500)
Vitamin B-12: 1088 pg/mL (ref 232–1245)

## 2018-11-09 NOTE — Telephone Encounter (Signed)
[employer] medicare order sent to [facility]. They will obtain the auth and reach out to the patient to schedule.  °

## 2018-11-10 LAB — URINE CULTURE
MICRO NUMBER:: 664772
Result:: NO GROWTH
SPECIMEN QUALITY:: ADEQUATE

## 2018-11-10 NOTE — Assessment & Plan Note (Signed)
TIA: Transit mental status changes, probably a TIA that other factors may be playing a role including EtOH, taking over-the-counter supplement called Prevagen, depression, etc. At this point he is on aspirin 81; mentally gradually going back to normal, he will see neurology this afternoon.  They might have further recommendations. From my side I will check a UA and urine culture. EtOH: Recent LFTs increased, patient denies but wife reports that he has been drinking more than usual.  Strongly encouraged to stop etoh or no more than 1 glass of wine. Depression: On Lexapro and Abilify, PHQ 9 today 15, moderate depression.  Recommend to discuss with psychiatry.   William Norton, the patient's son reports that isolation due to quarantine is taking a toll on his father and I agree.  Counseling may be in order.  Again recommend to see psychiatry. RTC to 3 months.

## 2018-11-11 ENCOUNTER — Encounter: Payer: Self-pay | Admitting: Internal Medicine

## 2018-11-11 DIAGNOSIS — R69 Illness, unspecified: Secondary | ICD-10-CM | POA: Diagnosis not present

## 2018-11-11 MED ORDER — ONETOUCH ULTRASOFT LANCETS MISC: 12 refills | Status: DC

## 2018-11-16 ENCOUNTER — Telehealth: Payer: Self-pay

## 2018-11-16 ENCOUNTER — Encounter: Payer: Self-pay | Admitting: Internal Medicine

## 2018-11-16 NOTE — Telephone Encounter (Signed)
-----   Message from Garvin Fila, MD sent at 11/11/2018  2:36 PM EDT ----- Mitchell Heir advised the patient that blood work for reversible causes for memory loss was all satisfactory.  His bad cholesterol is satisfactory but triglycerides are slightly high.  Advised the patient to see or talk to primary care physician for advice to treat elevated triglycerides.

## 2018-11-16 NOTE — Telephone Encounter (Signed)
Notes recorded by Marval Regal, RN on 11/16/2018 at 11:03 AM EDT  Labs were forward to PCP.  ------   Notes recorded by Marval Regal, RN on 11/16/2018 at 11:02 AM EDT  I called patient about his blood work results. I stated the memory labs were satisfactory. I stated the bad cholesterol was satisfactory but the triglycerides are high. He wants him to see PCP to treat triglycerides. I stated it will be forward to PCP. The pt verbalized understanding.  ------

## 2018-11-17 ENCOUNTER — Other Ambulatory Visit: Payer: Self-pay

## 2018-11-17 ENCOUNTER — Ambulatory Visit: Payer: Medicare HMO

## 2018-11-17 DIAGNOSIS — R410 Disorientation, unspecified: Secondary | ICD-10-CM | POA: Diagnosis not present

## 2018-11-17 DIAGNOSIS — G3184 Mild cognitive impairment, so stated: Secondary | ICD-10-CM

## 2018-11-17 MED ORDER — LOSARTAN POTASSIUM 25 MG PO TABS: 25.0000 mg | ORAL_TABLET | Freq: Every day | ORAL | 1 refills | Status: DC

## 2018-12-06 DIAGNOSIS — R69 Illness, unspecified: Secondary | ICD-10-CM | POA: Diagnosis not present

## 2018-12-10 ENCOUNTER — Ambulatory Visit
Admission: RE | Admit: 2018-12-10 | Discharge: 2018-12-10 | Disposition: A | Discharge status: Home / Self Care | Payer: Medicare HMO | Source: Ambulatory Visit | Attending: Neurology | Admitting: Neurology

## 2018-12-10 ENCOUNTER — Other Ambulatory Visit: Payer: Self-pay

## 2018-12-10 DIAGNOSIS — G459 Transient cerebral ischemic attack, unspecified: Secondary | ICD-10-CM

## 2018-12-10 MED ORDER — GADOBENATE DIMEGLUMINE 529 MG/ML IV SOLN
19.0000 mL | Freq: Once | INTRAVENOUS | Status: AC | PRN
  Administered 2018-12-10: 19 mL via INTRAVENOUS

## 2019-01-01 DIAGNOSIS — R69 Illness, unspecified: Secondary | ICD-10-CM | POA: Diagnosis not present

## 2019-01-16 ENCOUNTER — Ambulatory Visit (INDEPENDENT_AMBULATORY_CARE_PROVIDER_SITE_OTHER): Payer: Medicare HMO | Admitting: Neurology

## 2019-01-16 ENCOUNTER — Other Ambulatory Visit: Payer: Self-pay

## 2019-01-16 ENCOUNTER — Encounter: Payer: Self-pay | Admitting: Neurology

## 2019-01-16 VITALS — BP 144/88 | HR 59 | Temp 97.8°F | Wt 230.2 lb

## 2019-01-16 DIAGNOSIS — G20 Parkinson's disease: Secondary | ICD-10-CM | POA: Diagnosis not present

## 2019-01-16 MED ORDER — CARBIDOPA-LEVODOPA 25-100 MG PO TABS: 1.0000 | ORAL_TABLET | Freq: Three times a day (TID) | ORAL | 2 refills | Status: DC
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
## 2019-01-16 NOTE — Patient Instructions (Signed)
I had a long discussion with the patient regarding his resting and tremor this is likely parkinsonian and has shown some response to low-dose Sinemet which is tolerating well without side effects.  I recommend increase the dose of Sinemet to 1 tablet 3 times daily gradually over the next few days.  Continue aspirin for stroke prevention and maintain strict control hypertension with blood pressure goal below 130/90, lipids with LDL cholesterol goal below 70 mg percent and diabetes with hemoglobin A1c goal below 6.5%.  Will return for follow-up in the future in 3 months with my nurse practitioner William Norton or call earlier if necessary.

## 2019-02-07 ENCOUNTER — Other Ambulatory Visit: Payer: Self-pay

## 2019-02-08 ENCOUNTER — Other Ambulatory Visit: Payer: Self-pay

## 2019-02-08 ENCOUNTER — Encounter: Payer: Self-pay | Admitting: Internal Medicine

## 2019-02-08 ENCOUNTER — Ambulatory Visit (INDEPENDENT_AMBULATORY_CARE_PROVIDER_SITE_OTHER): Payer: Medicare HMO | Admitting: Internal Medicine

## 2019-02-08 VITALS — BP 150/95 | HR 68 | Temp 97.6°F | Resp 16 | Ht 71.5 in | Wt 230.1 lb

## 2019-02-08 DIAGNOSIS — G20 Parkinson's disease: Secondary | ICD-10-CM

## 2019-02-08 DIAGNOSIS — J479 Bronchiectasis, uncomplicated: Secondary | ICD-10-CM

## 2019-02-08 DIAGNOSIS — E119 Type 2 diabetes mellitus without complications: Secondary | ICD-10-CM

## 2019-02-08 DIAGNOSIS — I10 Essential (primary) hypertension: Secondary | ICD-10-CM

## 2019-02-08 MED ORDER — LOSARTAN POTASSIUM 50 MG PO TABS: 50.0000 mg | ORAL_TABLET | Freq: Every day | ORAL | 3 refills | Status: DC

## 2019-02-08 NOTE — Patient Instructions (Addendum)
Per our records you are due for an eye exam. Please contact your eye doctor to schedule an appointment. Please have them send copies of your office visit notes to [place]. Our fax number is [phone].    GO TO THE FRONT DESK Schedule a nurse visit 2 weeks from today. We will check your blood and your blood pressure  Schedule a office visit with me in 4 months   Increase losartan to 50 mg daily, watch your salt intake.  Start over-the-counter Flonase: 2 sprays on each side of the nose every night. If you continue to notice blood early in the mornings when you spit let me know     Check the  blood pressure 2 or 3 times a week BP GOAL is between 110/65 and  135/85. If it is consistently higher or lower, let me know
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
## 2019-02-08 NOTE — Progress Notes (Signed)
Pre visit review using our clinic review tool, if applicable. No additional management support is needed unless otherwise documented below in the visit note. 

## 2019-02-09 DIAGNOSIS — G20C Parkinsonism, unspecified: Secondary | ICD-10-CM | POA: Insufficient documentation

## 2019-02-09 DIAGNOSIS — J479 Bronchiectasis, uncomplicated: Secondary | ICD-10-CM | POA: Insufficient documentation

## 2019-02-09 DIAGNOSIS — G20 Parkinson's disease: Secondary | ICD-10-CM | POA: Insufficient documentation

## 2019-02-09 NOTE — Assessment & Plan Note (Signed)
DM: Last A1c 6.9, on metformin, check A1c.  Foot exam negative. HTN: BP elevated today, recheck by me: 150/100, right arm.  Continue metoprolol, increase losartan to 50 mg.  Monitor BPs.  Nurse visit in 2 weeks for BP check done CMP. TIA? Neurology note 11/08/2018: DDX included posterior circulation TIA, seizure with postictal confusion. They also noted tremors-possibly Parkinson and MCI but age-appropriate. Blood work was satisfactory. MR angio head-neck: L carotid 50-60%, no intervention needed EEG wnl Neurology note from 01/16/2019:  DX Parkinson, they recommend to continue with Sinemet. Parkinson disease: As above, follow-up per neurology EtOH: Not an issue since last visit according to the patient Depression: PHQ-9 today decreased from 15 to 9.  Improved. Bronchiectasis: Per CT 10-2018.  Question of hemoptysis, see HPI.  For now recommend observation, patient is very clear that does not think blood is coming from the chest.  Recommend Flonase, reassess on RTC. Plan: RTC for nurse visit, BP check labs in 2 weeks RTC for office visit 4 months

## 2019-02-10 DIAGNOSIS — R69 Illness, unspecified: Secondary | ICD-10-CM | POA: Diagnosis not present

## 2019-02-13 ENCOUNTER — Other Ambulatory Visit: Payer: Self-pay | Admitting: Internal Medicine

## 2019-02-13 ENCOUNTER — Other Ambulatory Visit: Payer: Self-pay | Admitting: Cardiology

## 2019-02-16 ENCOUNTER — Telehealth: Payer: Self-pay | Admitting: *Deleted

## 2019-02-16 NOTE — Telephone Encounter (Signed)
Paul from [facility] called and wanted to make sure before dispensing medication that celebrex and plavix that there is a drug interaction, bleeding risk and wanted to let you know.  Are you ok with dispensing?  Patient has been on celebrex for a while and plavix since 06/06/17.  Can call the pharmacist Eddie Dibbles back directly at [phone] and can leave message if he does not pickup.

## 2019-02-17 NOTE — Telephone Encounter (Signed)
Spoke w/ Eddie Dibbles at [facility]- informed of PCP recommendations. Eddie Dibbles verbalized understanding.

## 2019-02-17 NOTE — Telephone Encounter (Signed)
[facility] called again and is requesting an update. Please advise.

## 2019-02-17 NOTE — Telephone Encounter (Signed)
I agree with his concerns, and I have discussed with the patient the issue before. His quality of life improved significantly with a low-dose of Celebrex and fortunately he is tolerating it well.  Okay to continue the same medications

## 2019-02-20 DIAGNOSIS — R972 Elevated prostate specific antigen [PSA]: Secondary | ICD-10-CM | POA: Diagnosis not present

## 2019-02-21 LAB — PSA: PSA: 4.36

## 2019-02-22 ENCOUNTER — Ambulatory Visit: Payer: Medicare HMO

## 2019-02-23 ENCOUNTER — Ambulatory Visit: Payer: Medicare HMO

## 2019-03-14 DIAGNOSIS — R3915 Urgency of urination: Secondary | ICD-10-CM | POA: Diagnosis not present

## 2019-03-14 DIAGNOSIS — N401 Enlarged prostate with lower urinary tract symptoms: Secondary | ICD-10-CM | POA: Diagnosis not present

## 2019-03-14 DIAGNOSIS — R972 Elevated prostate specific antigen [PSA]: Secondary | ICD-10-CM | POA: Diagnosis not present

## 2019-03-29 DIAGNOSIS — Z0100 Encounter for examination of eyes and vision without abnormal findings: Secondary | ICD-10-CM | POA: Diagnosis not present

## 2019-03-29 DIAGNOSIS — Z135 Encounter for screening for eye and ear disorders: Secondary | ICD-10-CM | POA: Diagnosis not present

## 2019-03-29 DIAGNOSIS — E119 Type 2 diabetes mellitus without complications: Secondary | ICD-10-CM | POA: Diagnosis not present

## 2019-03-29 DIAGNOSIS — H26493 Other secondary cataract, bilateral: Secondary | ICD-10-CM | POA: Diagnosis not present

## 2019-03-29 DIAGNOSIS — E7800 Pure hypercholesterolemia, unspecified: Secondary | ICD-10-CM | POA: Diagnosis not present

## 2019-03-29 DIAGNOSIS — H5200 Hypermetropia, unspecified eye: Secondary | ICD-10-CM | POA: Diagnosis not present

## 2019-03-29 DIAGNOSIS — I10 Essential (primary) hypertension: Secondary | ICD-10-CM | POA: Diagnosis not present

## 2019-03-29 LAB — HM DIABETES EYE EXAM

## 2019-04-03 ENCOUNTER — Encounter: Payer: Self-pay | Admitting: Internal Medicine

## 2019-04-12 ENCOUNTER — Ambulatory Visit: Payer: Medicare HMO | Admitting: Adult Health

## 2019-04-12 ENCOUNTER — Other Ambulatory Visit: Payer: Self-pay

## 2019-04-12 ENCOUNTER — Encounter: Payer: Self-pay | Admitting: Adult Health

## 2019-04-12 VITALS — BP 126/78 | HR 73 | Temp 97.6°F | Ht 71.5 in | Wt 238.0 lb

## 2019-04-12 DIAGNOSIS — G20C Parkinsonism, unspecified: Secondary | ICD-10-CM

## 2019-04-12 DIAGNOSIS — G459 Transient cerebral ischemic attack, unspecified: Secondary | ICD-10-CM | POA: Diagnosis not present

## 2019-04-12 DIAGNOSIS — G20 Parkinson's disease: Secondary | ICD-10-CM

## 2019-04-12 MED ORDER — CARBIDOPA-LEVODOPA 25-100 MG PO TABS: 1.0000 | ORAL_TABLET | Freq: Three times a day (TID) | ORAL | 2 refills | Status: DC

## 2019-04-12 NOTE — Patient Instructions (Signed)
Your Plan:  Continue Sinemet 1 tablet 3 times daily -Ensure you take all doses as prescribed to maximize benefit  No indication to continue Plavix therefore recommended discontinuing and starting on aspirin 81 mg daily  Continue to monitor blood pressure and sugar levels at home and ongoing follow-up with PCP for blood pressure, cholesterol and diabetes management   Follow-up in 6 months or call earlier if needed     Thank you for coming to see [place] at [facility]. I hope we have been able to provide you high quality care today.  You may receive a patient satisfaction survey over the next few weeks. We would appreciate your feedback and comments so that we may continue to improve ourselves and the health of our patients.

## 2019-04-12 NOTE — Progress Notes (Signed)
I agree with the above plan 
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
## 2019-04-17 ENCOUNTER — Encounter: Payer: Self-pay | Admitting: Internal Medicine

## 2019-04-28 ENCOUNTER — Other Ambulatory Visit: Payer: Self-pay | Admitting: Internal Medicine

## 2019-05-01 ENCOUNTER — Ambulatory Visit: Payer: Medicare HMO | Admitting: Internal Medicine

## 2019-05-31 DIAGNOSIS — L218 Other seborrheic dermatitis: Secondary | ICD-10-CM | POA: Diagnosis not present

## 2019-06-05 ENCOUNTER — Ambulatory Visit: Payer: Medicare HMO | Attending: Internal Medicine

## 2019-06-05 DIAGNOSIS — Z23 Encounter for immunization: Secondary | ICD-10-CM | POA: Insufficient documentation

## 2019-06-05 DIAGNOSIS — R69 Illness, unspecified: Secondary | ICD-10-CM | POA: Diagnosis not present

## 2019-06-05 NOTE — Progress Notes (Signed)
   Covid-19 Vaccination Clinic  Name:  William Norton    MRN: [id number] DOB: 07/01/1945  06/05/2019  [name] was observed post Covid-19 immunization for 15 minutes without incidence. He was provided with Vaccine Information Sheet and instruction to access the V-Safe system.   [name] was instructed to call 911 with any severe reactions post vaccine: Marland Kitchen Difficulty breathing  . Swelling of your face and throat  . A fast heartbeat  . A bad rash all over your body  . Dizziness and weakness    Immunizations Administered    Name Date Dose VIS Date Route   Pfizer COVID-19 Vaccine 06/05/2019  1:49 PM 0.3 mL 04/07/2019 Intramuscular   Manufacturer: [place]   Lot: [id number]   [place]: [id number]

## 2019-06-13 ENCOUNTER — Encounter: Payer: Self-pay | Admitting: Internal Medicine

## 2019-06-13 ENCOUNTER — Other Ambulatory Visit: Payer: Self-pay

## 2019-06-13 ENCOUNTER — Ambulatory Visit (INDEPENDENT_AMBULATORY_CARE_PROVIDER_SITE_OTHER): Payer: Medicare HMO | Admitting: Internal Medicine

## 2019-06-13 VITALS — BP 143/101 | HR 72 | Temp 96.5°F | Resp 18 | Ht 72.0 in | Wt 233.4 lb

## 2019-06-13 DIAGNOSIS — I10 Essential (primary) hypertension: Secondary | ICD-10-CM | POA: Diagnosis not present

## 2019-06-13 DIAGNOSIS — F419 Anxiety disorder, unspecified: Secondary | ICD-10-CM

## 2019-06-13 DIAGNOSIS — F32A Depression, unspecified: Secondary | ICD-10-CM

## 2019-06-13 DIAGNOSIS — E119 Type 2 diabetes mellitus without complications: Secondary | ICD-10-CM | POA: Diagnosis not present

## 2019-06-13 DIAGNOSIS — J479 Bronchiectasis, uncomplicated: Secondary | ICD-10-CM

## 2019-06-13 DIAGNOSIS — F329 Major depressive disorder, single episode, unspecified: Secondary | ICD-10-CM

## 2019-06-13 DIAGNOSIS — R69 Illness, unspecified: Secondary | ICD-10-CM | POA: Diagnosis not present

## 2019-06-13 DIAGNOSIS — E785 Hyperlipidemia, unspecified: Secondary | ICD-10-CM

## 2019-06-13 LAB — COMPREHENSIVE METABOLIC PANEL
ALT: 19 U/L (ref 0–53)
AST: 23 U/L (ref 0–37)
Albumin: 4.4 g/dL (ref 3.5–5.2)
Alkaline Phosphatase: 52 U/L (ref 39–117)
BUN: 16 mg/dL (ref 6–23)
CO2: 24 mEq/L (ref 19–32)
Calcium: 9.5 mg/dL (ref 8.4–10.5)
Chloride: 102 mEq/L (ref 96–112)
Creatinine, Ser: 0.89 mg/dL (ref 0.40–1.50)
GFR: 83.37 mL/min (ref >60.00)
Glucose, Bld: 104 mg/dL — ABNORMAL HIGH (ref 70–99)
Potassium: 4.6 mEq/L (ref 3.5–5.1)
Sodium: 135 mEq/L (ref 135–145)
Total Bilirubin: 0.7 mg/dL (ref 0.2–1.2)
Total Protein: 7.3 g/dL (ref 6.0–8.3)

## 2019-06-13 LAB — LIPID PANEL
Cholesterol: 149 mg/dL (ref 0–200)
HDL: 56.2 mg/dL (ref >39.00)
LDL Cholesterol: 71 mg/dL (ref 0–99)
NonHDL: 92.92
Total CHOL/HDL Ratio: 3
Triglycerides: 109 mg/dL (ref 0.0–149.0)
VLDL: 21.8 mg/dL (ref 0.0–40.0)

## 2019-06-13 LAB — HEMOGLOBIN A1C: Hgb A1c MFr Bld: 7.9 % — ABNORMAL HIGH (ref 4.6–6.5)

## 2019-06-13 NOTE — Progress Notes (Signed)
Pre visit review using our clinic review tool, if applicable. No additional management support is needed unless otherwise documented below in the visit note. 

## 2019-06-13 NOTE — Patient Instructions (Addendum)
GO TO THE LAB : Get the blood work     GO TO THE FRONT DESK Come back for a physical exam in 5 months, please make an appointment  Continue checking your blood pressures. BP GOAL is between 110/65 and  135/85. If it is consistently higher or lower, let me know
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
## 2019-06-14 NOTE — Assessment & Plan Note (Signed)
DM: Currently on Metformin, check A1c HTN: BP today 142/101, at home never more than 140/90, typically normal.  We will check a CMP, continue losartan, metoprolol. High cholesterol: On Lipitor, check FLP Bronchiectasis: See last visit, since then no more suspicious symptoms for hemoptysis Depression: PHQ-9 today scored 10, on Lexapro and Abilify, has not seen psychiatry in a while.  Well controlled. TIA?  Parkinson disease: Last visit with neurology 04/12/2019, was rxd to continue Sinemet. L UTS: Saw urology  November 2020, they noted urinary urgency not responding well to a single agent, recommended two drugs, patient today continue reporting that symptoms are not completely well controlled. RTC 5 months CPX

## 2019-06-15 DIAGNOSIS — E785 Hyperlipidemia, unspecified: Secondary | ICD-10-CM | POA: Diagnosis not present

## 2019-06-15 DIAGNOSIS — M1990 Unspecified osteoarthritis, unspecified site: Secondary | ICD-10-CM | POA: Diagnosis not present

## 2019-06-15 DIAGNOSIS — I10 Essential (primary) hypertension: Secondary | ICD-10-CM | POA: Diagnosis not present

## 2019-06-15 DIAGNOSIS — E119 Type 2 diabetes mellitus without complications: Secondary | ICD-10-CM | POA: Diagnosis not present

## 2019-06-15 DIAGNOSIS — I2510 Atherosclerotic heart disease of native coronary artery without angina pectoris: Secondary | ICD-10-CM | POA: Diagnosis not present

## 2019-06-15 DIAGNOSIS — F419 Anxiety disorder, unspecified: Secondary | ICD-10-CM | POA: Diagnosis not present

## 2019-06-15 DIAGNOSIS — R69 Illness, unspecified: Secondary | ICD-10-CM | POA: Diagnosis not present

## 2019-06-15 DIAGNOSIS — K219 Gastro-esophageal reflux disease without esophagitis: Secondary | ICD-10-CM | POA: Diagnosis not present

## 2019-06-15 DIAGNOSIS — G8929 Other chronic pain: Secondary | ICD-10-CM | POA: Diagnosis not present

## 2019-06-15 DIAGNOSIS — E669 Obesity, unspecified: Secondary | ICD-10-CM | POA: Diagnosis not present

## 2019-06-18 ENCOUNTER — Other Ambulatory Visit: Payer: Self-pay | Admitting: Internal Medicine

## 2019-06-19 MED ORDER — JARDIANCE 10 MG PO TABS: 10.0000 mg | ORAL_TABLET | Freq: Every day | ORAL | 0 refills | Status: DC

## 2019-06-19 NOTE — Addendum Note (Signed)
Addended byDamita Dunnings D on: 06/19/2019 10:37 AM   Modules accepted: Orders

## 2019-06-22 ENCOUNTER — Ambulatory Visit: Payer: Medicare HMO

## 2019-06-26 ENCOUNTER — Encounter: Payer: Self-pay | Admitting: Internal Medicine

## 2019-06-27 MED ORDER — LOSARTAN POTASSIUM 50 MG PO TABS: 50.0000 mg | ORAL_TABLET | Freq: Every day | ORAL | 1 refills | Status: DC

## 2019-06-30 ENCOUNTER — Ambulatory Visit: Payer: Medicare HMO | Attending: Internal Medicine

## 2019-06-30 DIAGNOSIS — Z23 Encounter for immunization: Secondary | ICD-10-CM | POA: Insufficient documentation

## 2019-06-30 NOTE — Progress Notes (Signed)
   Covid-19 Vaccination Clinic  Name:  William Norton    MRN: [id number] DOB: 26-Nov-1945  06/30/2019  [name] was observed post Covid-19 immunization for 15 minutes without incident. He was provided with Vaccine Information Sheet and instruction to access the V-Safe system.   [name] was instructed to call 911 with any severe reactions post vaccine: Marland Kitchen Difficulty breathing  . Swelling of face and throat  . A fast heartbeat  . A bad rash all over body  . Dizziness and weakness   Immunizations Administered    Name Date Dose VIS Date Route   Pfizer COVID-19 Vaccine 06/30/2019  8:33 AM 0.3 mL 04/07/2019 Intramuscular   Manufacturer: [place]   Lot: [id number]   [place]: [id number]

## 2019-08-02 ENCOUNTER — Other Ambulatory Visit: Payer: Self-pay | Admitting: Internal Medicine

## 2019-08-02 ENCOUNTER — Other Ambulatory Visit: Payer: Self-pay | Admitting: Cardiology

## 2019-08-15 ENCOUNTER — Other Ambulatory Visit: Payer: Self-pay | Admitting: Internal Medicine

## 2019-08-26 DIAGNOSIS — F339 Major depressive disorder, recurrent, unspecified: Secondary | ICD-10-CM | POA: Diagnosis not present

## 2019-08-26 DIAGNOSIS — R69 Illness, unspecified: Secondary | ICD-10-CM | POA: Diagnosis not present

## 2019-09-04 DIAGNOSIS — R69 Illness, unspecified: Secondary | ICD-10-CM | POA: Diagnosis not present

## 2019-09-05 DIAGNOSIS — R69 Illness, unspecified: Secondary | ICD-10-CM | POA: Diagnosis not present

## 2019-09-11 DIAGNOSIS — R972 Elevated prostate specific antigen [PSA]: Secondary | ICD-10-CM | POA: Diagnosis not present

## 2019-09-19 ENCOUNTER — Ambulatory Visit (INDEPENDENT_AMBULATORY_CARE_PROVIDER_SITE_OTHER): Payer: Medicare HMO | Admitting: Internal Medicine

## 2019-09-19 ENCOUNTER — Other Ambulatory Visit: Payer: Self-pay

## 2019-09-19 ENCOUNTER — Encounter: Payer: Self-pay | Admitting: Internal Medicine

## 2019-09-19 VITALS — BP 101/65 | HR 70 | Temp 96.6°F | Resp 18 | Ht 72.0 in | Wt 218.4 lb

## 2019-09-19 DIAGNOSIS — F419 Anxiety disorder, unspecified: Secondary | ICD-10-CM | POA: Diagnosis not present

## 2019-09-19 DIAGNOSIS — I10 Essential (primary) hypertension: Secondary | ICD-10-CM | POA: Diagnosis not present

## 2019-09-19 DIAGNOSIS — F329 Major depressive disorder, single episode, unspecified: Secondary | ICD-10-CM | POA: Diagnosis not present

## 2019-09-19 DIAGNOSIS — R69 Illness, unspecified: Secondary | ICD-10-CM | POA: Diagnosis not present

## 2019-09-19 DIAGNOSIS — E1159 Type 2 diabetes mellitus with other circulatory complications: Secondary | ICD-10-CM

## 2019-09-19 DIAGNOSIS — F32A Depression, unspecified: Secondary | ICD-10-CM

## 2019-09-19 MED ORDER — EMPAGLIFLOZIN 10 MG PO TABS: 10.0000 mg | ORAL_TABLET | Freq: Every day | ORAL | 0 refills | Status: DC
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
## 2019-09-19 NOTE — Patient Instructions (Addendum)
In addition to your routine medicines, also take Jardiance 1 tablet in the morning to help your blood sugar.  If your blood sugar is going below 100 please let me know.    Check the  blood pressure weekly BP GOAL is between 110/65 and  135/85. If it is consistently higher or lower, let me know   GO TO THE FRONT DESK, PLEASE SCHEDULE YOUR APPOINTMENTS Come back for checkup in 3 months

## 2019-09-19 NOTE — Progress Notes (Signed)
Pre visit review using our clinic review tool, if applicable. No additional management support is needed unless otherwise documented below in the visit note. 

## 2019-09-20 DIAGNOSIS — R69 Illness, unspecified: Secondary | ICD-10-CM | POA: Diagnosis not present

## 2019-09-20 NOTE — Assessment & Plan Note (Signed)
DM: Patient on Metformin, A1c 3 months ago was 7.9, Jardiance added.  The patient reports he never saw the message from  me so he has not started [place].  Ambulatory CBGs ranged from 130 to 200 (rarely). New prescription for Jardiance sent to her local pharmacy, explained the patient how this medication works, watch for GU rash, watch for hypoglycemia.  Reassess on RTC HTN: Continue losartan, metoprolol.  Last BMP satisfactory Depression, anxiety: Saw his psychiatrist virtually, meds were refilled, he is doing well Preventive care: Had Covid shots CCS: Due for colonoscopy this year, he thinks he will proceed, will wait for GI letter RTC 3 months

## 2019-09-21 DIAGNOSIS — M4316 Spondylolisthesis, lumbar region: Secondary | ICD-10-CM | POA: Diagnosis not present

## 2019-09-21 DIAGNOSIS — M48061 Spinal stenosis, lumbar region without neurogenic claudication: Secondary | ICD-10-CM | POA: Diagnosis not present

## 2019-09-21 DIAGNOSIS — M47816 Spondylosis without myelopathy or radiculopathy, lumbar region: Secondary | ICD-10-CM | POA: Diagnosis not present

## 2019-09-21 DIAGNOSIS — M545 Low back pain: Secondary | ICD-10-CM | POA: Diagnosis not present

## 2019-09-21 DIAGNOSIS — M961 Postlaminectomy syndrome, not elsewhere classified: Secondary | ICD-10-CM | POA: Diagnosis not present

## 2019-10-11 ENCOUNTER — Ambulatory Visit: Payer: Medicare HMO | Admitting: Adult Health

## 2019-10-11 ENCOUNTER — Encounter: Payer: Self-pay | Admitting: Adult Health

## 2019-10-11 VITALS — BP 130/80 | HR 62 | Ht 71.0 in | Wt 228.0 lb

## 2019-10-11 DIAGNOSIS — R202 Paresthesia of skin: Secondary | ICD-10-CM | POA: Diagnosis not present

## 2019-10-11 DIAGNOSIS — G20 Parkinson's disease: Secondary | ICD-10-CM

## 2019-10-11 DIAGNOSIS — G459 Transient cerebral ischemic attack, unspecified: Secondary | ICD-10-CM | POA: Diagnosis not present

## 2019-10-11 DIAGNOSIS — G20C Parkinsonism, unspecified: Secondary | ICD-10-CM

## 2019-10-11 MED ORDER — GABAPENTIN 300 MG PO CAPS: 300.0000 mg | ORAL_CAPSULE | Freq: Every day | ORAL | 3 refills | Status: DC

## 2019-10-11 NOTE — Patient Instructions (Addendum)
Initiate gabapentin 300mg  nightly to help with back pain which has been interfering with ambulation as well as neuropathy  Continue Sinemet 1 tab 3 times daily for tremors  Continue aspirin 81 mg daily  and lipitor  for secondary stroke prevention  Continue to follow up with PCP regarding cholesterol, blood pressure and diabetes management   Maintain strict control of hypertension with blood pressure goal below 130/90, diabetes with hemoglobin A1c goal below 6.5% and cholesterol with LDL cholesterol (bad cholesterol) goal below 70 mg/dL. I also advised the patient to eat a healthy diet with plenty of whole grains, cereals, fruits and vegetables, exercise regularly and maintain ideal body weight.  Followup in the future with me in 4 months or call earlier if needed       Thank you for coming to see [place] at [facility]. I hope we have been able to provide you high quality care today.  You may receive a patient satisfaction survey over the next few weeks. We would appreciate your feedback and comments so that we may continue to improve ourselves and the health of our patients.     Gabapentin capsules or tablets What is this medicine? GABAPENTIN (GA ba pen tin) is used to control seizures in certain types of epilepsy. It is also used to treat certain types of nerve pain. This medicine may be used for other purposes; ask your health care provider or pharmacist if you have questions. COMMON BRAND NAME(S): Active-PAC with Gabapentin, Gabarone, Neurontin What should I tell my health care provider before I take this medicine? They need to know if you have any of these conditions:  history of drug abuse or alcohol abuse problem  kidney disease  lung or breathing disease  suicidal thoughts, plans, or attempt; a previous suicide attempt by you or a family member  an unusual or allergic reaction to gabapentin, other medicines, foods, dyes, or preservatives  pregnant or  trying to get pregnant  breast-feeding How should I use this medicine? Take this medicine by mouth with a glass of water. Follow the directions on the prescription label. You can take it with or without food. If it upsets your stomach, take it with food. Take your medicine at regular intervals. Do not take it more often than directed. Do not stop taking except on your doctor's advice. If you are directed to break the 600 or 800 mg tablets in half as part of your dose, the extra half tablet should be used for the next dose. If you have not used the extra half tablet within 28 days, it should be thrown away. A special MedGuide will be given to you by the pharmacist with each prescription and refill. Be sure to read this information carefully each time. Talk to your pediatrician regarding the use of this medicine in children. While this drug may be prescribed for children as young as 3 years for selected conditions, precautions do apply. Overdosage: If you think you have taken too much of this medicine contact a poison control center or emergency room at once. NOTE: This medicine is only for you. Do not share this medicine with others. What if I miss a dose? If you miss a dose, take it as soon as you can. If it is almost time for your next dose, take only that dose. Do not take double or extra doses. What may interact with this medicine? This medicine may interact with the following medications:  alcohol  antihistamines for allergy, cough,  and cold  certain medicines for anxiety or sleep  certain medicines for depression like amitriptyline, fluoxetine, sertraline  certain medicines for seizures like phenobarbital, primidone  certain medicines for stomach problems  general anesthetics like halothane, isoflurane, methoxyflurane, propofol  local anesthetics like lidocaine, pramoxine, tetracaine  medicines that relax muscles for surgery  narcotic medicines for pain  phenothiazines like  chlorpromazine, mesoridazine, prochlorperazine, thioridazine This list may not describe all possible interactions. Give your health care provider a list of all the medicines, herbs, non-prescription drugs, or dietary supplements you use. Also tell them if you smoke, drink alcohol, or use illegal drugs. Some items may interact with your medicine. What should I watch for while using this medicine? Visit your doctor or health care provider for regular checks on your progress. You may want to keep a record at home of how you feel your condition is responding to treatment. You may want to share this information with your doctor or health care provider at each visit. You should contact your doctor or health care provider if your seizures get worse or if you have any new types of seizures. Do not stop taking this medicine or any of your seizure medicines unless instructed by your doctor or health care provider. Stopping your medicine suddenly can increase your seizures or their severity. This medicine may cause serious skin reactions. They can happen weeks to months after starting the medicine. Contact your health care provider right away if you notice fevers or flu-like symptoms with a rash. The rash may be red or purple and then turn into blisters or peeling of the skin. Or, you might notice a red rash with swelling of the face, lips or lymph nodes in your neck or under your arms. Wear a medical identification bracelet or chain if you are taking this medicine for seizures, and carry a card that lists all your medications. You may get drowsy, dizzy, or have blurred vision. Do not drive, use machinery, or do anything that needs mental alertness until you know how this medicine affects you. To reduce dizzy or fainting spells, do not sit or stand up quickly, especially if you are an older patient. Alcohol can increase drowsiness and dizziness. Avoid alcoholic drinks. Your mouth may get dry. Chewing sugarless gum or  sucking hard candy, and drinking plenty of water will help. The use of this medicine may increase the chance of suicidal thoughts or actions. Pay special attention to how you are responding while on this medicine. Any worsening of mood, or thoughts of suicide or dying should be reported to your health care provider right away. Women who become pregnant while using this medicine may enroll in the [place] Pregnancy Registry by calling [phone]. This registry collects information about the safety of antiepileptic drug use during pregnancy. What side effects may I notice from receiving this medicine? Side effects that you should report to your doctor or health care professional as soon as possible:  allergic reactions like skin rash, itching or hives, swelling of the face, lips, or tongue  breathing problems  rash, fever, and swollen lymph nodes  redness, blistering, peeling or loosening of the skin, including inside the mouth  suicidal thoughts, mood changes Side effects that usually do not require medical attention (report to your doctor or health care professional if they continue or are bothersome):  dizziness  drowsiness  headache  nausea, vomiting  swelling of ankles, feet, hands  tiredness This list may not describe all possible  side effects. Call your doctor for medical advice about side effects. You may report side effects to FDA at 1-800-FDA-1088. Where should I keep my medicine? Keep out of reach of children. This medicine may cause accidental overdose and death if it taken by other adults, children, or pets. Mix any unused medicine with a substance like cat litter or coffee grounds. Then throw the medicine away in a sealed container like a sealed bag or a coffee can with a lid. Do not use the medicine after the expiration date. Store at room temperature between 15 and 30 degrees C (59 and 86 degrees F). NOTE: This sheet is a summary. It may not  cover all possible information. If you have questions about this medicine, talk to your doctor, pharmacist, or health care provider.  2020 Elsevier/Gold Standard (2018-07-15 14:16:43)
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
## 2019-10-12 ENCOUNTER — Other Ambulatory Visit: Payer: Self-pay | Admitting: Internal Medicine

## 2019-10-12 DIAGNOSIS — R69 Illness, unspecified: Secondary | ICD-10-CM | POA: Diagnosis not present

## 2019-10-13 DIAGNOSIS — R69 Illness, unspecified: Secondary | ICD-10-CM | POA: Diagnosis not present

## 2019-10-13 NOTE — Progress Notes (Signed)
I agree with the above plan 

## 2019-10-24 ENCOUNTER — Other Ambulatory Visit: Payer: Self-pay | Admitting: Cardiology

## 2019-10-26 DIAGNOSIS — L03213 Periorbital cellulitis: Secondary | ICD-10-CM | POA: Diagnosis not present

## 2019-10-26 DIAGNOSIS — L03113 Cellulitis of right upper limb: Secondary | ICD-10-CM | POA: Diagnosis not present

## 2019-10-26 NOTE — Progress Notes (Signed)
Called to do AWV. Pt is currently in ER. Pt states he will call back to reschedule. No charge. Will cancel visit.

## 2019-10-31 ENCOUNTER — Other Ambulatory Visit: Payer: Self-pay

## 2019-10-31 ENCOUNTER — Emergency Department (HOSPITAL_COMMUNITY)
Admission: EM | Admit: 2019-10-31 | Discharge: 2019-10-31 | Disposition: A | Discharge status: Home / Self Care | Payer: Medicare HMO | Attending: Emergency Medicine | Admitting: Emergency Medicine

## 2019-10-31 ENCOUNTER — Ambulatory Visit: Payer: Medicare HMO | Admitting: *Deleted

## 2019-10-31 ENCOUNTER — Ambulatory Visit (INDEPENDENT_AMBULATORY_CARE_PROVIDER_SITE_OTHER)
Admission: RE | Admit: 2019-10-31 | Discharge: 2019-10-31 | Disposition: A | Discharge status: Other Acute Inpatient Hospital | Payer: Medicare HMO | Source: Ambulatory Visit

## 2019-10-31 DIAGNOSIS — D509 Iron deficiency anemia, unspecified: Secondary | ICD-10-CM | POA: Insufficient documentation

## 2019-10-31 DIAGNOSIS — E119 Type 2 diabetes mellitus without complications: Secondary | ICD-10-CM | POA: Diagnosis not present

## 2019-10-31 DIAGNOSIS — Z87891 Personal history of nicotine dependence: Secondary | ICD-10-CM | POA: Insufficient documentation

## 2019-10-31 DIAGNOSIS — L03113 Cellulitis of right upper limb: Secondary | ICD-10-CM | POA: Diagnosis not present

## 2019-10-31 DIAGNOSIS — Z7982 Long term (current) use of aspirin: Secondary | ICD-10-CM | POA: Insufficient documentation

## 2019-10-31 DIAGNOSIS — Z79899 Other long term (current) drug therapy: Secondary | ICD-10-CM | POA: Diagnosis not present

## 2019-10-31 DIAGNOSIS — R220 Localized swelling, mass and lump, head: Secondary | ICD-10-CM | POA: Diagnosis present

## 2019-10-31 DIAGNOSIS — I119 Hypertensive heart disease without heart failure: Secondary | ICD-10-CM | POA: Diagnosis not present

## 2019-10-31 DIAGNOSIS — L03818 Cellulitis of other sites: Secondary | ICD-10-CM

## 2019-10-31 DIAGNOSIS — I2510 Atherosclerotic heart disease of native coronary artery without angina pectoris: Secondary | ICD-10-CM | POA: Diagnosis not present

## 2019-10-31 DIAGNOSIS — L03213 Periorbital cellulitis: Secondary | ICD-10-CM | POA: Insufficient documentation

## 2019-10-31 DIAGNOSIS — D649 Anemia, unspecified: Secondary | ICD-10-CM | POA: Diagnosis not present

## 2019-10-31 LAB — COMPREHENSIVE METABOLIC PANEL
ALT: 9 U/L (ref 0–44)
AST: 23 U/L (ref 15–41)
Albumin: 4.2 g/dL (ref 3.5–5.0)
Alkaline Phosphatase: 46 U/L (ref 38–126)
Anion gap: 9 (ref 5–15)
BUN: 21 mg/dL (ref 8–23)
CO2: 21 mmol/L — ABNORMAL LOW (ref 22–32)
Calcium: 8.7 mg/dL — ABNORMAL LOW (ref 8.9–10.3)
Chloride: 108 mmol/L (ref 98–111)
Creatinine, Ser: 1.03 mg/dL (ref 0.61–1.24)
GFR calc Af Amer: >60 mL/min (ref >60)
GFR calc non Af Amer: >60 mL/min (ref >60)
Glucose, Bld: 96 mg/dL (ref 70–99)
Potassium: 4.4 mmol/L (ref 3.5–5.1)
Sodium: 138 mmol/L (ref 135–145)
Total Bilirubin: 0.6 mg/dL (ref 0.3–1.2)
Total Protein: 7.5 g/dL (ref 6.5–8.1)

## 2019-10-31 LAB — CBC WITH DIFFERENTIAL/PLATELET
Abs Immature Granulocytes: 0.01 10*3/uL (ref 0.00–0.07)
Basophils Absolute: 0 10*3/uL (ref 0.0–0.1)
Basophils Relative: 1 %
Eosinophils Absolute: 1.1 10*3/uL — ABNORMAL HIGH (ref 0.0–0.5)
Eosinophils Relative: 14 %
HCT: 34.6 % — ABNORMAL LOW (ref 39.0–52.0)
Hemoglobin: 10.7 g/dL — ABNORMAL LOW (ref 13.0–17.0)
Immature Granulocytes: 0 %
Lymphocytes Relative: 18 %
Lymphs Abs: 1.4 10*3/uL (ref 0.7–4.0)
MCH: 23.6 pg — ABNORMAL LOW (ref 26.0–34.0)
MCHC: 30.9 g/dL (ref 30.0–36.0)
MCV: 76.4 fL — ABNORMAL LOW (ref 80.0–100.0)
Monocytes Absolute: 0.6 10*3/uL (ref 0.1–1.0)
Monocytes Relative: 8 %
Neutro Abs: 4.9 10*3/uL (ref 1.7–7.7)
Neutrophils Relative %: 59 %
Platelets: 290 10*3/uL (ref 150–400)
RBC: 4.53 MIL/uL (ref 4.22–5.81)
RDW: 18.9 % — ABNORMAL HIGH (ref 11.5–15.5)
WBC: 8.1 10*3/uL (ref 4.0–10.5)
nRBC: 0 % (ref 0.0–0.2)

## 2019-10-31 LAB — LACTIC ACID, PLASMA
Lactic Acid, Venous: 2.2 mmol/L (ref 0.5–1.9)
Lactic Acid, Venous: 1.8 mmol/L (ref 0.5–1.9)

## 2019-10-31 MED ORDER — CLINDAMYCIN HCL 300 MG PO CAPS
300.0000 mg | ORAL_CAPSULE | Freq: Four times a day (QID) | ORAL | 0 refills | Status: DC
Start: 2019-10-31 — End: 2019-11-09

## 2019-10-31 MED ORDER — SODIUM CHLORIDE 0.9 % IV SOLN
2.0000 g | Freq: Once | INTRAVENOUS | Status: AC
  Administered 2019-10-31: 2 g via INTRAVENOUS
  Filled 2019-10-31: qty 20

## 2019-10-31 MED ORDER — SODIUM CHLORIDE 0.9 % IV BOLUS
500.0000 mL | Freq: Once | INTRAVENOUS | Status: AC
  Administered 2019-10-31: 500 mL via INTRAVENOUS

## 2019-10-31 MED ORDER — VANCOMYCIN HCL 2000 MG/400ML IV SOLN
2000.0000 mg | Freq: Once | INTRAVENOUS | Status: AC
  Administered 2019-10-31: 2000 mg via INTRAVENOUS
  Filled 2019-10-31: qty 400
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
## 2019-10-31 NOTE — Discharge Instructions (Addendum)
Stop using Neosporin on the right arm wound.  Use a warm compress on the right arm infection, 3 times a day for 20 minutes.  You can also try using one on your face to help the swelling and discomfort.  Stop using the trimethoprim sulfamethoxazole, for now.  Start taking the IV antibiotic clindamycin, by tomorrow morning.  Follow-up with your doctor for checkup in a week or so.  Return here, if needed.
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
## 2019-10-31 NOTE — Discharge Instructions (Addendum)
Go to the Emergency Department for evaluation of the redness on your face and arm.

## 2019-10-31 NOTE — Progress Notes (Signed)
Pharmacy Note   A consult was received from an ED physician for vancomycin per pharmacy dosing.    The patient's profile has been reviewed for ht/wt/allergies/indication/available labs.    A one time order has been placed for vancomycin 2000 mg IV x1 .  Further antibiotics/pharmacy consults should be ordered by admitting physician if indicated.                       Thank you,  Royetta Asal, PharmD, BCPS 10/31/2019 11:50 AM

## 2019-11-03 ENCOUNTER — Other Ambulatory Visit: Payer: Self-pay | Admitting: Cardiology

## 2019-11-05 LAB — CULTURE, BLOOD (ROUTINE X 2)
Culture: NO GROWTH
Culture: NO GROWTH
Special Requests: ADEQUATE
Special Requests: ADEQUATE

## 2019-11-06 ENCOUNTER — Other Ambulatory Visit: Payer: Self-pay | Admitting: Cardiology

## 2019-11-07 ENCOUNTER — Other Ambulatory Visit: Payer: Self-pay

## 2019-11-09 ENCOUNTER — Ambulatory Visit (INDEPENDENT_AMBULATORY_CARE_PROVIDER_SITE_OTHER): Payer: Medicare HMO | Admitting: Internal Medicine

## 2019-11-09 ENCOUNTER — Other Ambulatory Visit: Payer: Self-pay

## 2019-11-09 ENCOUNTER — Encounter: Payer: Self-pay | Admitting: Internal Medicine

## 2019-11-09 VITALS — BP 122/70 | HR 61 | Temp 98.6°F | Resp 16 | Ht 71.0 in | Wt 226.0 lb

## 2019-11-09 DIAGNOSIS — D619 Aplastic anemia, unspecified: Secondary | ICD-10-CM

## 2019-11-09 DIAGNOSIS — D509 Iron deficiency anemia, unspecified: Secondary | ICD-10-CM | POA: Diagnosis not present

## 2019-11-09 DIAGNOSIS — L03113 Cellulitis of right upper limb: Secondary | ICD-10-CM | POA: Diagnosis not present

## 2019-11-09 DIAGNOSIS — Z0001 Encounter for general adult medical examination with abnormal findings: Secondary | ICD-10-CM | POA: Diagnosis not present

## 2019-11-09 DIAGNOSIS — E1159 Type 2 diabetes mellitus with other circulatory complications: Secondary | ICD-10-CM | POA: Diagnosis not present

## 2019-11-09 DIAGNOSIS — Z0000 Encounter for general adult medical examination without abnormal findings: Secondary | ICD-10-CM

## 2019-11-09 DIAGNOSIS — Z122 Encounter for screening for malignant neoplasm of respiratory organs: Secondary | ICD-10-CM

## 2019-11-09 MED ORDER — CLINDAMYCIN HCL 300 MG PO CAPS: 300.0000 mg | ORAL_CAPSULE | Freq: Four times a day (QID) | ORAL | 0 refills | Status: DC

## 2019-11-09 NOTE — Patient Instructions (Addendum)
Take clindamycin for 4 additional days.  Apply OTC hydrocortisone 1% cream to the right arm  If you are not completely back to normal in few days let me know  We are arranging for a CAT scan of your lungs  GO TO THE LAB : Get the blood work     [place], [place] back for a checkup in 3 months
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
## 2019-11-10 DIAGNOSIS — M47816 Spondylosis without myelopathy or radiculopathy, lumbar region: Secondary | ICD-10-CM | POA: Diagnosis not present

## 2019-11-10 DIAGNOSIS — M961 Postlaminectomy syndrome, not elsewhere classified: Secondary | ICD-10-CM | POA: Diagnosis not present

## 2019-11-10 DIAGNOSIS — M48061 Spinal stenosis, lumbar region without neurogenic claudication: Secondary | ICD-10-CM | POA: Diagnosis not present

## 2019-11-10 DIAGNOSIS — M4316 Spondylolisthesis, lumbar region: Secondary | ICD-10-CM | POA: Diagnosis not present

## 2019-11-11 ENCOUNTER — Encounter: Payer: Self-pay | Admitting: Internal Medicine

## 2019-11-11 NOTE — Assessment & Plan Note (Signed)
Here for CPX DM: Ordering a A1c HTN: Seems to be well controlled, recent BMP okay, calcium is slightly off.  Continue present care, check a BMP on RTC High cholesterol: On atorvastatin, last LDL at goal.  No change CAD: SX, does not see cardiology regularly. H/o iron deficiency anemia, during recent ER visit hemoglobin was low, rechecking labs.  Denies GI symptoms. Cellulitis face and arm:  Improved but  still has some redness and swelling at the right elbow, will add 4 days of clindamycin and recommend OTC hydrocortisone 1%. RTC 3 months

## 2019-11-11 NOTE — Assessment & Plan Note (Signed)
--  Td 2018  -PNM 23: 06-09-10 -prevnar 10-15 -s/p shingrix - s/p covid vaccines   -Flu shot rec q year --[facility]: cscope 2016, due for cscope, GI letter re-printed, pt will think about it  --Prostate cancer screening:   Saw urology 02-2019, normal DRE, patient reports PSA was done 2 months ago by urology --Lung cancer screening:  order CT lung cancer screening --diet, exercise: Discussed --labs:   CBC, A1c, iron ferritin
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
## 2019-11-15 ENCOUNTER — Other Ambulatory Visit: Payer: Self-pay

## 2019-11-15 ENCOUNTER — Ambulatory Visit (INDEPENDENT_AMBULATORY_CARE_PROVIDER_SITE_OTHER): Payer: Medicare HMO | Admitting: *Deleted

## 2019-11-15 ENCOUNTER — Encounter: Payer: Self-pay | Admitting: *Deleted

## 2019-11-15 DIAGNOSIS — Z0000 Encounter for general adult medical examination without abnormal findings: Secondary | ICD-10-CM

## 2019-11-15 NOTE — Patient Instructions (Signed)
Please schedule your next medicare wellness visit with me in 1 yr. ° °Continue to eat heart healthy diet (full of fruits, vegetables, whole grains, lean protein, water--limit salt, fat, and sugar intake) and increase physical activity as tolerated. ° °Continue doing brain stimulating activities (puzzles, reading, adult coloring books, staying active) to keep memory sharp.  ° ° °William Norton , °Thank you for taking time to come for your Medicare Wellness Visit. I appreciate your ongoing commitment to your health goals. Please review the following plan we discussed and let me know if I can assist you in the future.  ° °These are the goals we discussed: °Goals   ° •  DIET - EAT MORE FRUITS AND VEGETABLES   ° •  Drink 5 glasses of water per day. (pt-stated)   ° •  exercise 3x/ week (pt-stated)   °  °  °This is a list of the screening recommended for you and due dates:  °Health Maintenance  °Topic Date Due  °• Colon Cancer Screening  09/14/2019  °• Flu Shot  11/26/2019  °• Hemoglobin A1C  12/11/2019  °• Complete foot exam   02/08/2020  °• Eye exam for diabetics  03/28/2020  °• Tetanus Vaccine  10/21/2026  °• COVID-19 Vaccine  Completed  °•  Hepatitis C: One time screening is recommended by Center for Disease Control  (CDC) for  adults born from 1945 through 1965.   Completed  °• Pneumonia vaccines  Completed  ° ° °Preventive Care 65 Years and Older, Male °Preventive care refers to lifestyle choices and visits with your health care provider that can promote health and wellness. This includes: °· A yearly physical exam. This is also called an annual well check. °· Regular dental and eye exams. °· Immunizations. °· Screening for certain conditions. °· Healthy lifestyle choices, such as diet and exercise. °What can I expect for my preventive care visit? °Physical exam °Your health care provider will check: °· Height and weight. These may be used to calculate body mass index (BMI), which is a measurement that tells if you are at  a healthy weight. °· Heart rate and blood pressure. °· Your skin for abnormal spots. °Counseling °Your health care provider may ask you questions about: °· Alcohol, tobacco, and drug use. °· Emotional well-being. °· Home and relationship well-being. °· Sexual activity. °· Eating habits. °· History of falls. °· Memory and ability to understand (cognition). °· Work and work environment. °What immunizations do I need? ° °Influenza (flu) vaccine °· This is recommended every year. °Tetanus, diphtheria, and pertussis (Tdap) vaccine °· You may need a Td booster every 10 years. °Varicella (chickenpox) vaccine °· You may need this vaccine if you have not already been vaccinated. °Zoster (shingles) vaccine °· You may need this after age 60. °Pneumococcal conjugate (PCV13) vaccine °· One dose is recommended after age 65. °Pneumococcal polysaccharide (PPSV23) vaccine °· One dose is recommended after age 65. °Measles, mumps, and rubella (MMR) vaccine °· You may need at least one dose of MMR if you were born in 1957 or later. You may also need a second dose. °Meningococcal conjugate (MenACWY) vaccine °· You may need this if you have certain conditions. °Hepatitis A vaccine °· You may need this if you have certain conditions or if you travel or work in places where you may be exposed to hepatitis A. °Hepatitis B vaccine °· You may need this if you have certain conditions or if you travel or work in places where you   may be exposed to hepatitis B. °Haemophilus influenzae type b (Hib) vaccine °· You may need this if you have certain conditions. °You may receive vaccines as individual doses or as more than one vaccine together in one shot (combination vaccines). Talk with your health care provider about the risks and benefits of combination vaccines. °What tests do I need? °Blood tests °· Lipid and cholesterol levels. These may be checked every 5 years, or more frequently depending on your overall health. °· Hepatitis C  test. °· Hepatitis B test. °Screening °· Lung cancer screening. You may have this screening every year starting at age 55 if you have a 30-pack-year history of smoking and currently smoke or have quit within the past 15 years. °· Colorectal cancer screening. All adults should have this screening starting at age 50 and continuing until age 75. Your health care provider may recommend screening at age 45 if you are at increased risk. You will have tests every 1-10 years, depending on your results and the type of screening test. °· Prostate cancer screening. Recommendations will vary depending on your family history and other risks. °· Diabetes screening. This is done by checking your blood sugar (glucose) after you have not eaten for a while (fasting). You may have this done every 1-3 years. °· Abdominal aortic aneurysm (AAA) screening. You may need this if you are a current or former smoker. °· Sexually transmitted disease (STD) testing. °Follow these instructions at home: °Eating and drinking °· Eat a diet that includes fresh fruits and vegetables, whole grains, lean protein, and low-fat dairy products. Limit your intake of foods with high amounts of sugar, saturated fats, and salt. °· Take vitamin and mineral supplements as recommended by your health care provider. °· Do not drink alcohol if your health care provider tells you not to drink. °· If you drink alcohol: °? Limit how much you have to 0-2 drinks a day. °? Be aware of how much alcohol is in your drink. In the [place] one drink equals one 12 oz bottle of beer (355 mL), one 5 oz glass of wine (148 mL), or one 1½ oz glass of hard liquor (44 mL). °Lifestyle °· Take daily care of your teeth and gums. °· Stay active. Exercise for at least 30 minutes on 5 or more days each week. °· Do not use any products that contain nicotine or tobacco, such as cigarettes, e-cigarettes, and chewing tobacco. If you need help quitting, ask your health care provider. °· If you are  sexually active, practice safe sex. Use a condom or other form of protection to prevent STIs (sexually transmitted infections). °· Talk with your health care provider about taking a low-dose aspirin or statin. °What's next? °· Visit your health care provider once a year for a well check visit. °· Ask your health care provider how often you should have your eyes and teeth checked. °· Stay up to date on all vaccines. °This information is not intended to replace advice given to you by your health care provider. Make sure you discuss any questions you have with your health care provider. °Document Revised: 04/07/2018 Document Reviewed: 04/07/2018 °Elsevier Patient Education © 2020 [employer] ° °

## 2019-11-20 ENCOUNTER — Other Ambulatory Visit: Payer: Self-pay | Admitting: Cardiology

## 2019-11-20 DIAGNOSIS — M6281 Muscle weakness (generalized): Secondary | ICD-10-CM | POA: Diagnosis not present

## 2019-11-20 DIAGNOSIS — R269 Unspecified abnormalities of gait and mobility: Secondary | ICD-10-CM | POA: Diagnosis not present

## 2019-11-20 DIAGNOSIS — M4316 Spondylolisthesis, lumbar region: Secondary | ICD-10-CM | POA: Diagnosis not present

## 2019-11-22 DIAGNOSIS — M6281 Muscle weakness (generalized): Secondary | ICD-10-CM | POA: Diagnosis not present

## 2019-11-22 DIAGNOSIS — R262 Difficulty in walking, not elsewhere classified: Secondary | ICD-10-CM | POA: Diagnosis not present

## 2019-11-22 DIAGNOSIS — R269 Unspecified abnormalities of gait and mobility: Secondary | ICD-10-CM | POA: Diagnosis not present

## 2019-11-22 DIAGNOSIS — M4316 Spondylolisthesis, lumbar region: Secondary | ICD-10-CM | POA: Diagnosis not present

## 2019-11-23 ENCOUNTER — Ambulatory Visit (HOSPITAL_BASED_OUTPATIENT_CLINIC_OR_DEPARTMENT_OTHER): Payer: Medicare HMO

## 2019-11-23 DIAGNOSIS — M40209 Unspecified kyphosis, site unspecified: Secondary | ICD-10-CM | POA: Diagnosis not present

## 2019-11-23 DIAGNOSIS — M4316 Spondylolisthesis, lumbar region: Secondary | ICD-10-CM | POA: Diagnosis not present

## 2019-11-23 DIAGNOSIS — M5136 Other intervertebral disc degeneration, lumbar region: Secondary | ICD-10-CM | POA: Diagnosis not present

## 2019-11-27 DIAGNOSIS — R269 Unspecified abnormalities of gait and mobility: Secondary | ICD-10-CM | POA: Diagnosis not present

## 2019-11-27 DIAGNOSIS — M6281 Muscle weakness (generalized): Secondary | ICD-10-CM | POA: Diagnosis not present

## 2019-11-27 DIAGNOSIS — M4316 Spondylolisthesis, lumbar region: Secondary | ICD-10-CM | POA: Diagnosis not present

## 2019-11-28 ENCOUNTER — Ambulatory Visit (HOSPITAL_BASED_OUTPATIENT_CLINIC_OR_DEPARTMENT_OTHER)
Admission: RE | Admit: 2019-11-28 | Discharge: 2019-11-28 | Disposition: A | Discharge status: Home / Self Care | Payer: Medicare HMO | Source: Ambulatory Visit | Attending: Internal Medicine | Admitting: Internal Medicine

## 2019-11-28 ENCOUNTER — Other Ambulatory Visit: Payer: Self-pay

## 2019-11-28 DIAGNOSIS — Z122 Encounter for screening for malignant neoplasm of respiratory organs: Secondary | ICD-10-CM | POA: Diagnosis not present

## 2019-11-28 DIAGNOSIS — Z87891 Personal history of nicotine dependence: Secondary | ICD-10-CM | POA: Diagnosis not present

## 2019-11-29 DIAGNOSIS — M6281 Muscle weakness (generalized): Secondary | ICD-10-CM | POA: Diagnosis not present

## 2019-11-29 DIAGNOSIS — R262 Difficulty in walking, not elsewhere classified: Secondary | ICD-10-CM | POA: Diagnosis not present

## 2019-11-29 DIAGNOSIS — M4316 Spondylolisthesis, lumbar region: Secondary | ICD-10-CM | POA: Diagnosis not present

## 2019-11-29 DIAGNOSIS — R269 Unspecified abnormalities of gait and mobility: Secondary | ICD-10-CM | POA: Diagnosis not present

## 2019-12-04 DIAGNOSIS — M6281 Muscle weakness (generalized): Secondary | ICD-10-CM | POA: Diagnosis not present

## 2019-12-04 DIAGNOSIS — M4316 Spondylolisthesis, lumbar region: Secondary | ICD-10-CM | POA: Diagnosis not present

## 2019-12-04 DIAGNOSIS — R269 Unspecified abnormalities of gait and mobility: Secondary | ICD-10-CM | POA: Diagnosis not present

## 2019-12-06 ENCOUNTER — Other Ambulatory Visit: Payer: Self-pay | Admitting: Internal Medicine

## 2019-12-06 DIAGNOSIS — M4316 Spondylolisthesis, lumbar region: Secondary | ICD-10-CM | POA: Diagnosis not present

## 2019-12-06 DIAGNOSIS — R269 Unspecified abnormalities of gait and mobility: Secondary | ICD-10-CM | POA: Diagnosis not present

## 2019-12-06 DIAGNOSIS — M6281 Muscle weakness (generalized): Secondary | ICD-10-CM | POA: Diagnosis not present

## 2019-12-07 DIAGNOSIS — M4316 Spondylolisthesis, lumbar region: Secondary | ICD-10-CM | POA: Diagnosis not present

## 2019-12-07 DIAGNOSIS — M47816 Spondylosis without myelopathy or radiculopathy, lumbar region: Secondary | ICD-10-CM | POA: Diagnosis not present

## 2019-12-07 DIAGNOSIS — M961 Postlaminectomy syndrome, not elsewhere classified: Secondary | ICD-10-CM | POA: Diagnosis not present

## 2019-12-07 DIAGNOSIS — M48061 Spinal stenosis, lumbar region without neurogenic claudication: Secondary | ICD-10-CM | POA: Diagnosis not present

## 2019-12-12 DIAGNOSIS — M4316 Spondylolisthesis, lumbar region: Secondary | ICD-10-CM | POA: Diagnosis not present

## 2019-12-12 DIAGNOSIS — R269 Unspecified abnormalities of gait and mobility: Secondary | ICD-10-CM | POA: Diagnosis not present

## 2019-12-12 DIAGNOSIS — M6281 Muscle weakness (generalized): Secondary | ICD-10-CM | POA: Diagnosis not present

## 2019-12-16 ENCOUNTER — Other Ambulatory Visit: Payer: Self-pay | Admitting: Internal Medicine

## 2019-12-18 DIAGNOSIS — M47816 Spondylosis without myelopathy or radiculopathy, lumbar region: Secondary | ICD-10-CM | POA: Diagnosis not present

## 2019-12-20 DIAGNOSIS — R269 Unspecified abnormalities of gait and mobility: Secondary | ICD-10-CM | POA: Diagnosis not present

## 2019-12-20 DIAGNOSIS — R262 Difficulty in walking, not elsewhere classified: Secondary | ICD-10-CM | POA: Diagnosis not present

## 2019-12-20 DIAGNOSIS — M6281 Muscle weakness (generalized): Secondary | ICD-10-CM | POA: Diagnosis not present

## 2019-12-20 DIAGNOSIS — M4316 Spondylolisthesis, lumbar region: Secondary | ICD-10-CM | POA: Diagnosis not present

## 2019-12-25 DIAGNOSIS — R269 Unspecified abnormalities of gait and mobility: Secondary | ICD-10-CM | POA: Diagnosis not present

## 2019-12-25 DIAGNOSIS — M4316 Spondylolisthesis, lumbar region: Secondary | ICD-10-CM | POA: Diagnosis not present

## 2019-12-25 DIAGNOSIS — M6281 Muscle weakness (generalized): Secondary | ICD-10-CM | POA: Diagnosis not present

## 2019-12-27 DIAGNOSIS — M6281 Muscle weakness (generalized): Secondary | ICD-10-CM | POA: Diagnosis not present

## 2019-12-27 DIAGNOSIS — M4316 Spondylolisthesis, lumbar region: Secondary | ICD-10-CM | POA: Diagnosis not present

## 2019-12-27 DIAGNOSIS — R269 Unspecified abnormalities of gait and mobility: Secondary | ICD-10-CM | POA: Diagnosis not present

## 2019-12-29 ENCOUNTER — Other Ambulatory Visit: Payer: Self-pay

## 2019-12-29 MED ORDER — ATORVASTATIN CALCIUM 40 MG PO TABS: 40.0000 mg | ORAL_TABLET | Freq: Every day | ORAL | 3 refills | Status: DC

## 2020-01-02 DIAGNOSIS — M48061 Spinal stenosis, lumbar region without neurogenic claudication: Secondary | ICD-10-CM | POA: Diagnosis not present

## 2020-01-04 DIAGNOSIS — M6281 Muscle weakness (generalized): Secondary | ICD-10-CM | POA: Diagnosis not present

## 2020-01-04 DIAGNOSIS — R269 Unspecified abnormalities of gait and mobility: Secondary | ICD-10-CM | POA: Diagnosis not present

## 2020-01-04 DIAGNOSIS — M4316 Spondylolisthesis, lumbar region: Secondary | ICD-10-CM | POA: Diagnosis not present

## 2020-01-04 DIAGNOSIS — R262 Difficulty in walking, not elsewhere classified: Secondary | ICD-10-CM | POA: Diagnosis not present

## 2020-01-09 ENCOUNTER — Other Ambulatory Visit: Payer: Self-pay | Admitting: Internal Medicine

## 2020-01-09 DIAGNOSIS — R69 Illness, unspecified: Secondary | ICD-10-CM | POA: Diagnosis not present

## 2020-01-10 DIAGNOSIS — M6281 Muscle weakness (generalized): Secondary | ICD-10-CM | POA: Diagnosis not present

## 2020-01-10 DIAGNOSIS — R269 Unspecified abnormalities of gait and mobility: Secondary | ICD-10-CM | POA: Diagnosis not present

## 2020-01-10 DIAGNOSIS — M4316 Spondylolisthesis, lumbar region: Secondary | ICD-10-CM | POA: Diagnosis not present

## 2020-01-16 DIAGNOSIS — M4316 Spondylolisthesis, lumbar region: Secondary | ICD-10-CM | POA: Diagnosis not present

## 2020-01-16 DIAGNOSIS — R262 Difficulty in walking, not elsewhere classified: Secondary | ICD-10-CM | POA: Diagnosis not present

## 2020-01-16 DIAGNOSIS — M6281 Muscle weakness (generalized): Secondary | ICD-10-CM | POA: Diagnosis not present

## 2020-01-16 DIAGNOSIS — R269 Unspecified abnormalities of gait and mobility: Secondary | ICD-10-CM | POA: Diagnosis not present

## 2020-01-16 IMAGING — CT CT ABD-PELV W/ CM
2 of 5 series · 16 of 46 positions shown, 18 images · IV contrast (ISOVUE)
Comparison: None

CLINICAL DATA: Fever, nausea and vomiting.

EXAM:
CT ABDOMEN AND PELVIS WITH CONTRAST
TECHNIQUE: Multidetector CT imaging of the abdomen and pelvis was performed
using the standard protocol following bolus administration of
intravenous contrast.
CONTRAST:  100mL [id number]-A99 IOPAMIDOL (YO5FFL-A99) INJECTION 61%

[Series 2: axial st · axial · 0.86mm/px · z∈[-504,-84]mm · 13 of 100 slices shown, 15 images]
[im 8/100  soft-tissue]
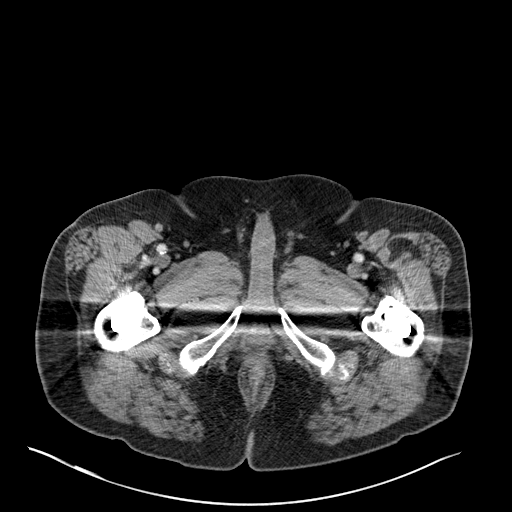
[im 8/100  bone]
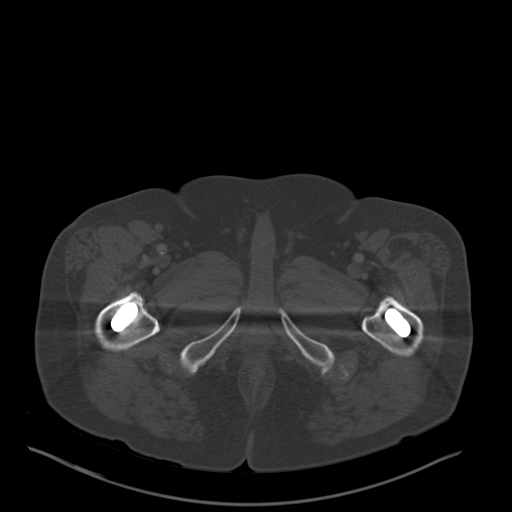
[im 15/100  soft-tissue]
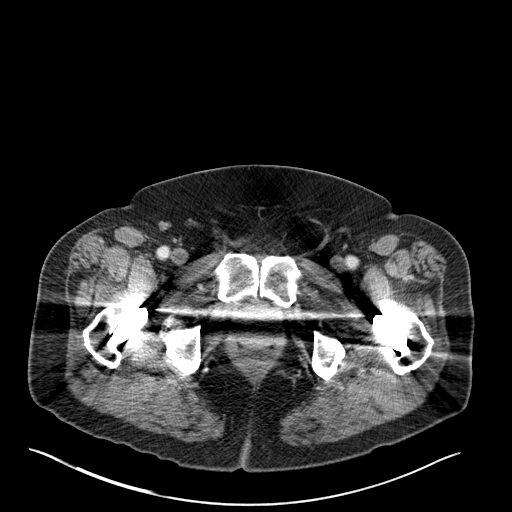
[im 22/100  soft-tissue]
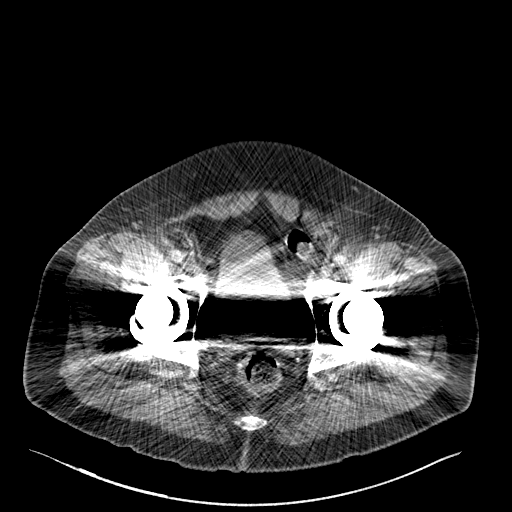
[im 29/100  soft-tissue]
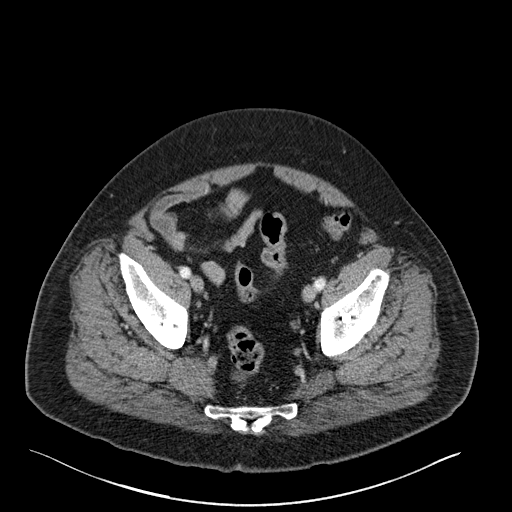
[im 36/100  soft-tissue]
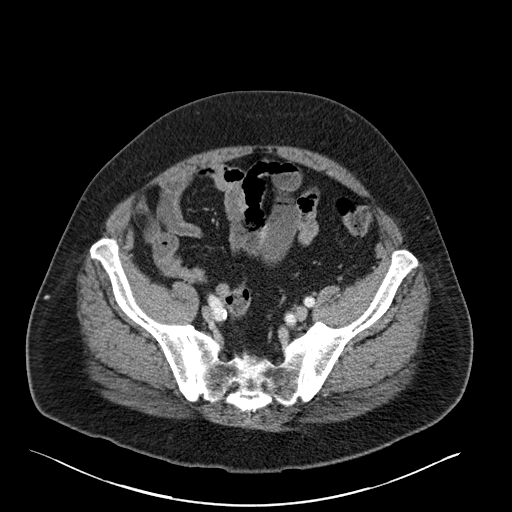
[im 43/100  soft-tissue]
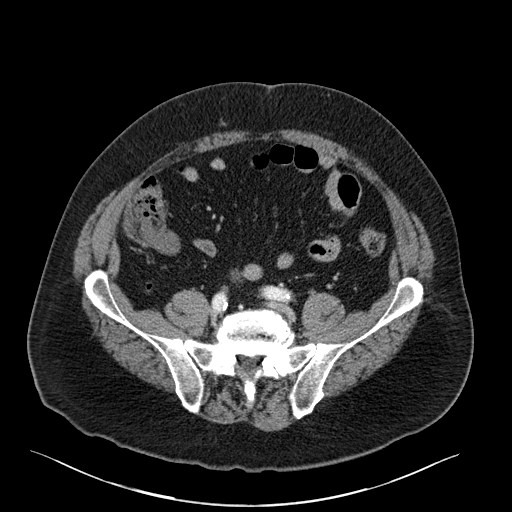
[im 50/100  soft-tissue]
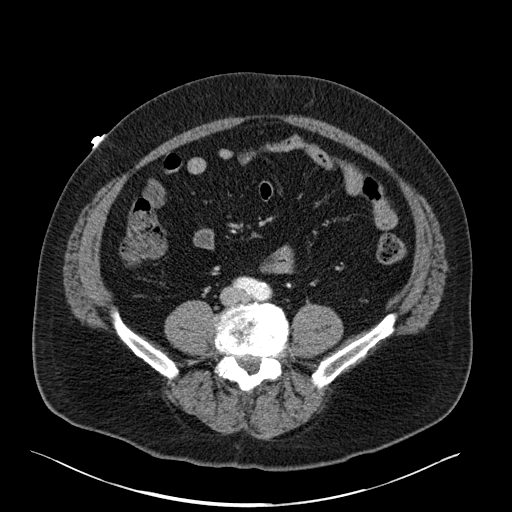
[im 57/100  soft-tissue]
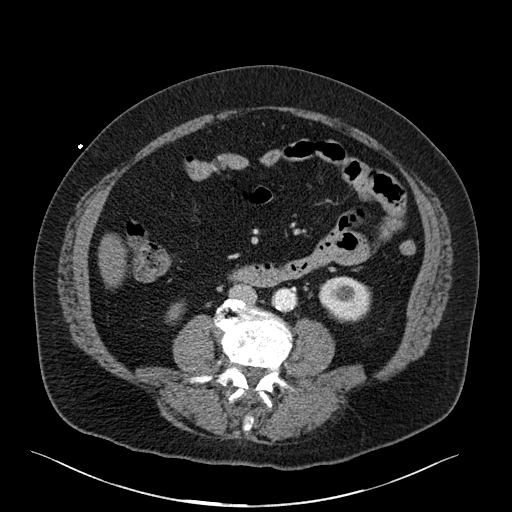
[im 64/100  soft-tissue]
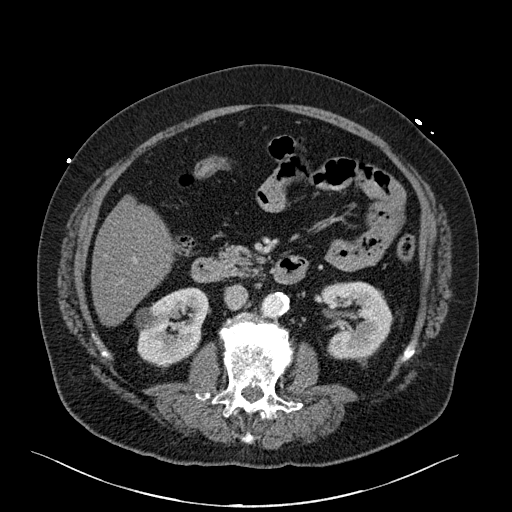
[im 64/100  bone]
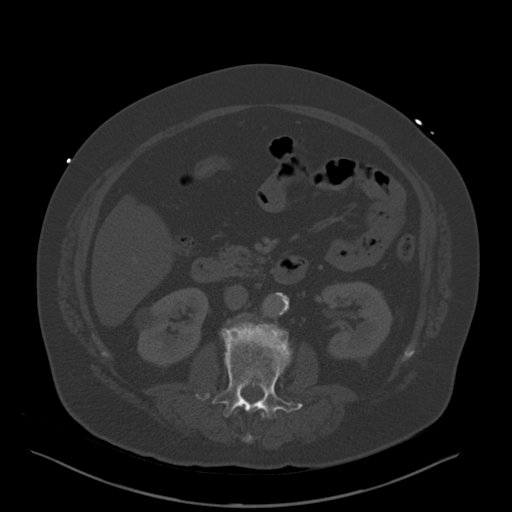
[im 71/100  soft-tissue]
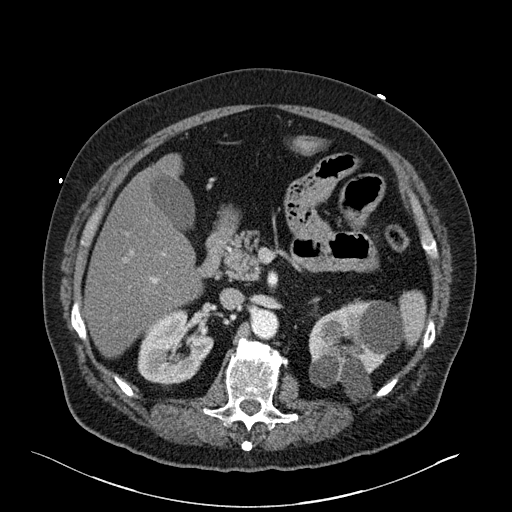
[im 78/100  soft-tissue]
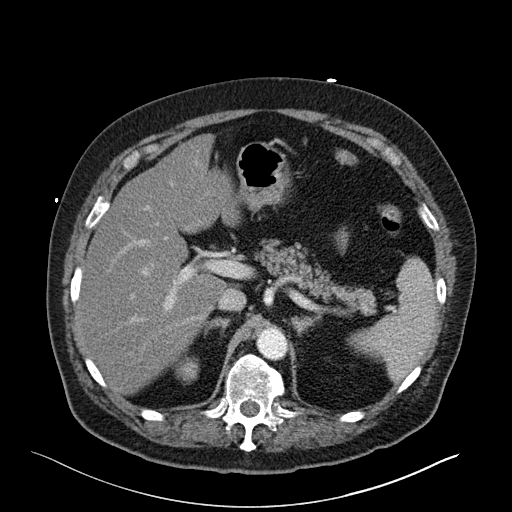
[im 85/100  soft-tissue]
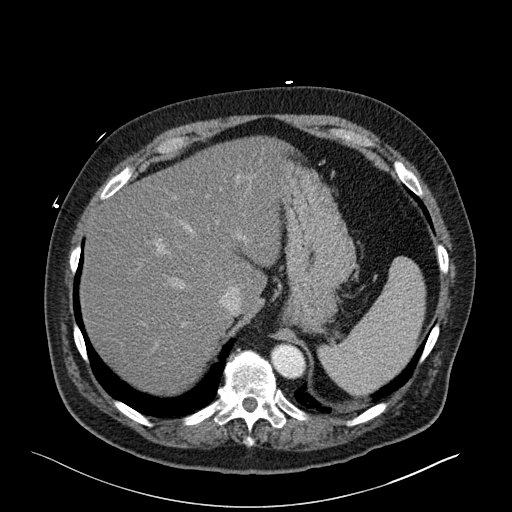
[im 92/100  soft-tissue]
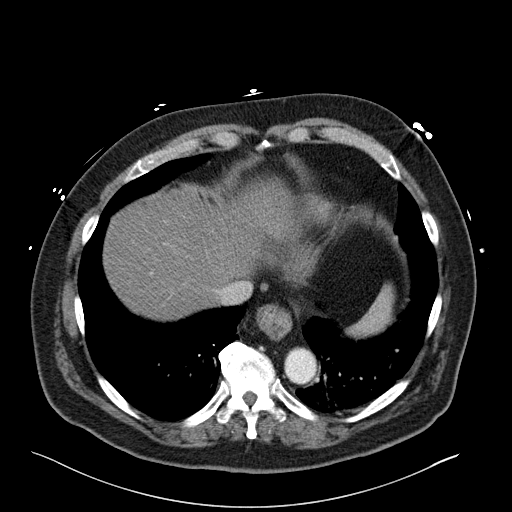

[Series 6: coronal st · coronal · 0.80mm/px · 3 of 114 slices shown]
[im 38/114  soft-tissue]
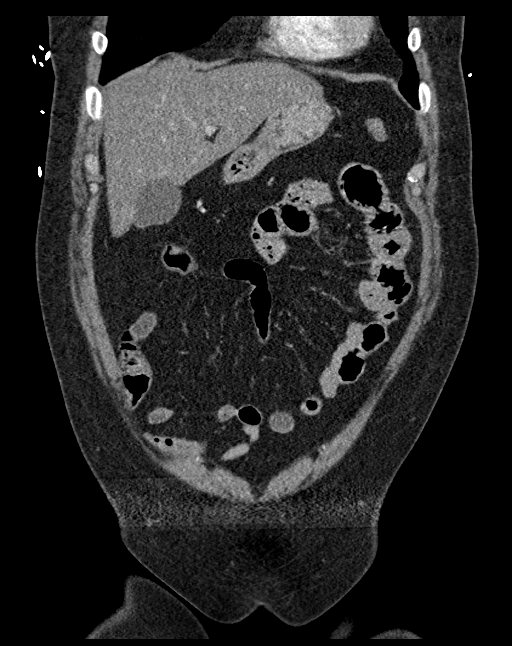
[im 51/114  soft-tissue]
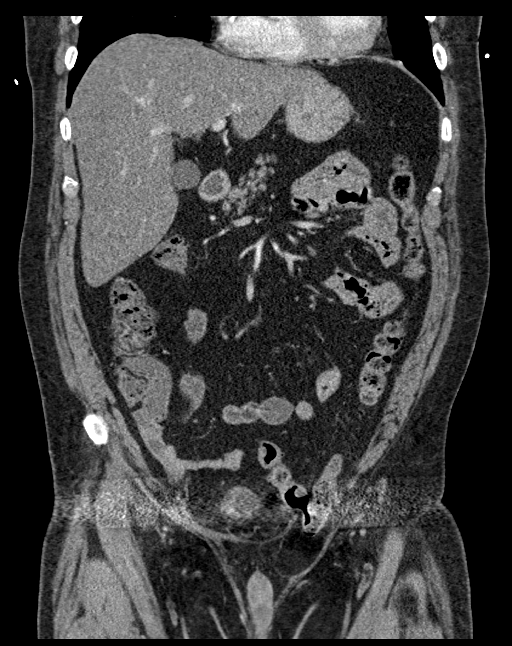
[im 63/114  soft-tissue]
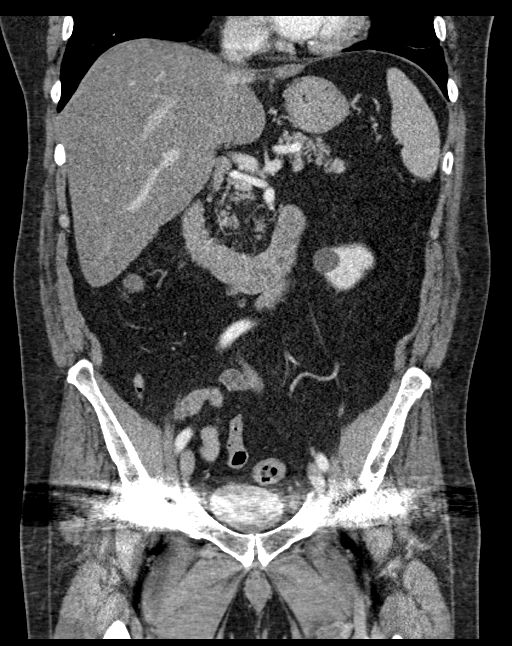

[16 of 46 positions shown; findings below may reference images not displayed]

FINDINGS: Lower chest: Patchy airspace densities within the posterior left
lower lobe identified which may reflect pneumonia. No pleural
effusions.

Hepatobiliary: Hepatic steatosis. No focal liver abnormality. In
gallbladder normal. No biliary dilatation.

Pancreas: Unremarkable. No pancreatic ductal dilatation or
surrounding inflammatory changes.

Spleen: Normal in size without focal abnormality.

Adrenals/Urinary Tract: Normal appearance of the adrenal glands.
Bilateral kidney cysts are identified. The largest arises from the
upper pole of left kidney measuring 4.7 cm. No mass or
hydronephrosis. Urinary bladder is largely obscured by beam
hardening artifact from patient's bilateral hip arthroplasty.

Stomach/Bowel: Small hiatal hernia. Stomach otherwise unremarkable.
The small bowel loops are normal in caliber. No wall thickening or
inflammation. The appendix is visualized and appears normal. There
is a left inguinal hernia which contains a nonobstructed loop of
sigmoid colon.

Vascular/Lymphatic: Aortic atherosclerosis. No aneurysm. No
abdominopelvic adenopathy identified.

Reproductive: Uterus and bilateral adnexa are unremarkable.

Other: Left inguinal hernia contains fat and nonobstructed loop of
sigmoid colon. No free fluid or fluid collections.

Musculoskeletal: Bilateral hip arthroplasty. Spondylosis is
identified within the lower thoracic and lumbar spine.
IMPRESSION: 1. No acute findings identified within the abdomen or pelvis.
2. Mild left lower lobe airspace densities and ground-glass
attenuation which may represent pneumonia.
3.  Aortic Atherosclerosis ([id number]).
4. Bilateral kidney cysts
5. Hepatic steatosis.

## 2020-01-18 ENCOUNTER — Other Ambulatory Visit: Payer: Self-pay | Admitting: Cardiology

## 2020-01-18 DIAGNOSIS — R269 Unspecified abnormalities of gait and mobility: Secondary | ICD-10-CM | POA: Diagnosis not present

## 2020-01-18 DIAGNOSIS — M6281 Muscle weakness (generalized): Secondary | ICD-10-CM | POA: Diagnosis not present

## 2020-01-18 DIAGNOSIS — R262 Difficulty in walking, not elsewhere classified: Secondary | ICD-10-CM | POA: Diagnosis not present

## 2020-01-18 DIAGNOSIS — M4316 Spondylolisthesis, lumbar region: Secondary | ICD-10-CM | POA: Diagnosis not present

## 2020-01-23 DIAGNOSIS — M48061 Spinal stenosis, lumbar region without neurogenic claudication: Secondary | ICD-10-CM | POA: Diagnosis not present

## 2020-01-23 DIAGNOSIS — M47816 Spondylosis without myelopathy or radiculopathy, lumbar region: Secondary | ICD-10-CM | POA: Diagnosis not present

## 2020-01-23 DIAGNOSIS — I10 Essential (primary) hypertension: Secondary | ICD-10-CM | POA: Diagnosis not present

## 2020-01-23 DIAGNOSIS — M4316 Spondylolisthesis, lumbar region: Secondary | ICD-10-CM | POA: Diagnosis not present

## 2020-01-29 DIAGNOSIS — R262 Difficulty in walking, not elsewhere classified: Secondary | ICD-10-CM | POA: Diagnosis not present

## 2020-01-29 DIAGNOSIS — M6281 Muscle weakness (generalized): Secondary | ICD-10-CM | POA: Diagnosis not present

## 2020-01-29 DIAGNOSIS — M4316 Spondylolisthesis, lumbar region: Secondary | ICD-10-CM | POA: Diagnosis not present

## 2020-01-29 DIAGNOSIS — R269 Unspecified abnormalities of gait and mobility: Secondary | ICD-10-CM | POA: Diagnosis not present

## 2020-01-31 DIAGNOSIS — M48061 Spinal stenosis, lumbar region without neurogenic claudication: Secondary | ICD-10-CM | POA: Diagnosis not present

## 2020-02-01 DIAGNOSIS — R269 Unspecified abnormalities of gait and mobility: Secondary | ICD-10-CM | POA: Diagnosis not present

## 2020-02-01 DIAGNOSIS — M4316 Spondylolisthesis, lumbar region: Secondary | ICD-10-CM | POA: Diagnosis not present

## 2020-02-01 DIAGNOSIS — M6281 Muscle weakness (generalized): Secondary | ICD-10-CM | POA: Diagnosis not present

## 2020-02-06 DIAGNOSIS — R69 Illness, unspecified: Secondary | ICD-10-CM | POA: Diagnosis not present

## 2020-02-08 DIAGNOSIS — R269 Unspecified abnormalities of gait and mobility: Secondary | ICD-10-CM | POA: Diagnosis not present

## 2020-02-08 DIAGNOSIS — M6281 Muscle weakness (generalized): Secondary | ICD-10-CM | POA: Diagnosis not present

## 2020-02-08 DIAGNOSIS — M4316 Spondylolisthesis, lumbar region: Secondary | ICD-10-CM | POA: Diagnosis not present

## 2020-02-08 DIAGNOSIS — R262 Difficulty in walking, not elsewhere classified: Secondary | ICD-10-CM | POA: Diagnosis not present

## 2020-02-09 ENCOUNTER — Ambulatory Visit: Payer: Medicare HMO | Admitting: Internal Medicine

## 2020-02-13 ENCOUNTER — Ambulatory Visit: Payer: Medicare HMO | Admitting: Adult Health

## 2020-02-14 DIAGNOSIS — M47816 Spondylosis without myelopathy or radiculopathy, lumbar region: Secondary | ICD-10-CM | POA: Diagnosis not present

## 2020-02-14 DIAGNOSIS — M4316 Spondylolisthesis, lumbar region: Secondary | ICD-10-CM | POA: Diagnosis not present

## 2020-02-14 DIAGNOSIS — M48061 Spinal stenosis, lumbar region without neurogenic claudication: Secondary | ICD-10-CM | POA: Diagnosis not present

## 2020-02-15 ENCOUNTER — Other Ambulatory Visit: Payer: Self-pay

## 2020-02-15 ENCOUNTER — Ambulatory Visit (INDEPENDENT_AMBULATORY_CARE_PROVIDER_SITE_OTHER): Payer: Medicare HMO | Admitting: Internal Medicine

## 2020-02-15 ENCOUNTER — Encounter: Payer: Self-pay | Admitting: Internal Medicine

## 2020-02-15 VITALS — BP 120/83 | HR 78 | Temp 97.7°F | Resp 18 | Ht 71.0 in | Wt 234.1 lb

## 2020-02-15 DIAGNOSIS — E1159 Type 2 diabetes mellitus with other circulatory complications: Secondary | ICD-10-CM

## 2020-02-15 DIAGNOSIS — K219 Gastro-esophageal reflux disease without esophagitis: Secondary | ICD-10-CM

## 2020-02-15 DIAGNOSIS — I10 Essential (primary) hypertension: Secondary | ICD-10-CM

## 2020-02-15 DIAGNOSIS — D509 Iron deficiency anemia, unspecified: Secondary | ICD-10-CM | POA: Diagnosis not present

## 2020-02-15 DIAGNOSIS — R269 Unspecified abnormalities of gait and mobility: Secondary | ICD-10-CM | POA: Diagnosis not present

## 2020-02-15 DIAGNOSIS — M4316 Spondylolisthesis, lumbar region: Secondary | ICD-10-CM | POA: Diagnosis not present

## 2020-02-15 DIAGNOSIS — Z1211 Encounter for screening for malignant neoplasm of colon: Secondary | ICD-10-CM

## 2020-02-15 DIAGNOSIS — M6281 Muscle weakness (generalized): Secondary | ICD-10-CM | POA: Diagnosis not present

## 2020-02-15 NOTE — Progress Notes (Signed)
Pre visit review using our clinic review tool, if applicable. No additional management support is needed unless otherwise documented below in the visit note. 

## 2020-02-15 NOTE — Patient Instructions (Signed)
Check the  blood pressure regularly BP GOAL is between 110/65 and  135/85. If it is consistently higher or lower, let me know  Please call your insurance, but you may benefit from are: Augustina Mood, Jardiance Also Rybelsus, Ozempic.  Please contact your gastroenterologist [phone], we sent a referral for  reflux management and colonoscopy  GO TO THE LAB : Get the blood work     [place], [place] back for a checkup in 3 months
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
## 2020-02-16 ENCOUNTER — Encounter: Payer: Self-pay | Admitting: Internal Medicine

## 2020-02-16 NOTE — Assessment & Plan Note (Signed)
DM: A1c in February was 7.9, Jardiance was added, he took it temporarily but could not afford it.  Plan: Check A1c, continue Metformin, will ask him to check coverage for other medications, see AVS.  If all meds are expensive consider Actos. HTN: BP today and at home controlled in the 120s / 80s, continue losartan, metoprolol.  Check BMP Anemia: Mild anemia noted, will order a CBC, iron, ferritin.  Related to upper GI problems? GERD: He reports cough & regurgitation at night when he lays down.  Good compliance with Protonix twice daily.  Recommend no late eating and call GI for further eval.  He said he will. Spinal stenosis: Still an issue, had local injections that are not helping, doing physical therapy, on Celebrex but his pain doctors will change it to something else soon according to the patient. Preventive care: Had a flu shot, planning to get a Covid booster soon. RTC 3 months

## 2020-02-18 ENCOUNTER — Other Ambulatory Visit: Payer: Self-pay | Admitting: Adult Health

## 2020-02-20 ENCOUNTER — Other Ambulatory Visit (INDEPENDENT_AMBULATORY_CARE_PROVIDER_SITE_OTHER): Payer: Medicare HMO

## 2020-02-20 ENCOUNTER — Other Ambulatory Visit: Payer: Self-pay

## 2020-02-20 DIAGNOSIS — D509 Iron deficiency anemia, unspecified: Secondary | ICD-10-CM

## 2020-02-20 DIAGNOSIS — I10 Essential (primary) hypertension: Secondary | ICD-10-CM

## 2020-02-20 DIAGNOSIS — E1159 Type 2 diabetes mellitus with other circulatory complications: Secondary | ICD-10-CM | POA: Diagnosis not present

## 2020-02-20 DIAGNOSIS — M6281 Muscle weakness (generalized): Secondary | ICD-10-CM | POA: Diagnosis not present

## 2020-02-20 DIAGNOSIS — R269 Unspecified abnormalities of gait and mobility: Secondary | ICD-10-CM | POA: Diagnosis not present

## 2020-02-20 DIAGNOSIS — M4316 Spondylolisthesis, lumbar region: Secondary | ICD-10-CM | POA: Diagnosis not present

## 2020-02-21 LAB — CBC WITH DIFFERENTIAL/PLATELET
Absolute Monocytes: 822 cells/uL (ref 200–950)
Basophils Absolute: 59 cells/uL (ref 0–200)
Basophils Relative: 0.6 %
Eosinophils Absolute: 455 cells/uL (ref 15–500)
Eosinophils Relative: 4.6 %
HCT: 35.1 % — ABNORMAL LOW (ref 38.5–50.0)
Hemoglobin: 10.7 g/dL — ABNORMAL LOW (ref 13.2–17.1)
Lymphs Abs: 2069 cells/uL (ref 850–3900)
MCH: 22.6 pg — ABNORMAL LOW (ref 27.0–33.0)
MCHC: 30.5 g/dL — ABNORMAL LOW (ref 32.0–36.0)
MCV: 74.1 fL — ABNORMAL LOW (ref 80.0–100.0)
MPV: 10.2 fL (ref 7.5–12.5)
Monocytes Relative: 8.3 %
Neutro Abs: 6494 cells/uL (ref 1500–7800)
Neutrophils Relative %: 65.6 %
Platelets: 320 10*3/uL (ref 140–400)
RBC: 4.74 10*6/uL (ref 4.20–5.80)
RDW: 17.1 % — ABNORMAL HIGH (ref 11.0–15.0)
Total Lymphocyte: 20.9 %
WBC: 9.9 10*3/uL (ref 3.8–10.8)

## 2020-02-21 LAB — HEMOGLOBIN A1C
Hgb A1c MFr Bld: 7.5 % of total Hgb — ABNORMAL HIGH (ref <5.7)
Mean Plasma Glucose: 169 (calc)
eAG (mmol/L): 9.3 (calc)

## 2020-02-21 LAB — IRON, TOTAL/TOTAL IRON BINDING CAP
%SAT: 7 % (calc) — ABNORMAL LOW (ref 20–48)
Iron: 35 ug/dL — ABNORMAL LOW (ref 50–180)
TIBC: 474 mcg/dL (calc) — ABNORMAL HIGH (ref 250–425)

## 2020-02-21 LAB — BASIC METABOLIC PANEL
BUN: 19 mg/dL (ref 7–25)
CO2: 24 mmol/L (ref 20–32)
Calcium: 9.7 mg/dL (ref 8.6–10.3)
Chloride: 101 mmol/L (ref 98–110)
Creat: 0.92 mg/dL (ref 0.70–1.18)
Glucose, Bld: 113 mg/dL — ABNORMAL HIGH (ref 65–99)
Potassium: 5.2 mmol/L (ref 3.5–5.3)
Sodium: 137 mmol/L (ref 135–146)

## 2020-02-21 LAB — FERRITIN: Ferritin: 14 ng/mL — ABNORMAL LOW (ref 24–380)

## 2020-02-22 DIAGNOSIS — R269 Unspecified abnormalities of gait and mobility: Secondary | ICD-10-CM | POA: Diagnosis not present

## 2020-02-22 DIAGNOSIS — M4316 Spondylolisthesis, lumbar region: Secondary | ICD-10-CM | POA: Diagnosis not present

## 2020-02-22 DIAGNOSIS — M6281 Muscle weakness (generalized): Secondary | ICD-10-CM | POA: Diagnosis not present

## 2020-02-22 DIAGNOSIS — R262 Difficulty in walking, not elsewhere classified: Secondary | ICD-10-CM | POA: Diagnosis not present

## 2020-02-24 ENCOUNTER — Encounter: Payer: Self-pay | Admitting: Internal Medicine

## 2020-02-26 MED ORDER — FERROUS FUMARATE 325 (106 FE) MG PO TABS: 1.0000 | ORAL_TABLET | Freq: Two times a day (BID) | ORAL | 1 refills | Status: DC

## 2020-02-26 MED ORDER — PIOGLITAZONE HCL 30 MG PO TABS
30.0000 mg | ORAL_TABLET | Freq: Every day | ORAL | 1 refills | Status: DC
Start: 2020-02-26 — End: 2020-09-09

## 2020-02-26 MED ORDER — METFORMIN HCL 1000 MG PO TABS: 1000.0000 mg | ORAL_TABLET | Freq: Two times a day (BID) | ORAL | 1 refills | Status: DC

## 2020-02-26 NOTE — Progress Notes (Signed)
A 

## 2020-02-26 NOTE — Addendum Note (Signed)
Addended byDamita Dunnings D on: 02/26/2020 07:40 AM   Modules accepted: Orders

## 2020-02-28 DIAGNOSIS — R972 Elevated prostate specific antigen [PSA]: Secondary | ICD-10-CM | POA: Diagnosis not present

## 2020-02-28 LAB — PSA: PSA: 5.91

## 2020-03-05 DIAGNOSIS — R3912 Poor urinary stream: Secondary | ICD-10-CM | POA: Diagnosis not present

## 2020-03-05 DIAGNOSIS — R350 Frequency of micturition: Secondary | ICD-10-CM | POA: Diagnosis not present

## 2020-03-05 DIAGNOSIS — R972 Elevated prostate specific antigen [PSA]: Secondary | ICD-10-CM | POA: Diagnosis not present

## 2020-03-05 DIAGNOSIS — R3915 Urgency of urination: Secondary | ICD-10-CM | POA: Diagnosis not present

## 2020-03-05 DIAGNOSIS — N401 Enlarged prostate with lower urinary tract symptoms: Secondary | ICD-10-CM | POA: Diagnosis not present

## 2020-03-07 DIAGNOSIS — F411 Generalized anxiety disorder: Secondary | ICD-10-CM | POA: Diagnosis not present

## 2020-03-07 DIAGNOSIS — R69 Illness, unspecified: Secondary | ICD-10-CM | POA: Diagnosis not present

## 2020-03-19 ENCOUNTER — Encounter: Payer: Self-pay | Admitting: Internal Medicine

## 2020-03-27 DIAGNOSIS — M47816 Spondylosis without myelopathy or radiculopathy, lumbar region: Secondary | ICD-10-CM | POA: Diagnosis not present

## 2020-03-27 DIAGNOSIS — M4316 Spondylolisthesis, lumbar region: Secondary | ICD-10-CM | POA: Diagnosis not present

## 2020-03-27 DIAGNOSIS — M48061 Spinal stenosis, lumbar region without neurogenic claudication: Secondary | ICD-10-CM | POA: Diagnosis not present

## 2020-04-01 ENCOUNTER — Encounter: Payer: Self-pay | Admitting: Adult Health

## 2020-04-01 ENCOUNTER — Telehealth: Payer: Self-pay | Admitting: Internal Medicine

## 2020-04-01 ENCOUNTER — Ambulatory Visit: Payer: Medicare HMO | Admitting: Adult Health

## 2020-04-01 VITALS — BP 162/99 | HR 86 | Ht 71.0 in | Wt 235.0 lb

## 2020-04-01 DIAGNOSIS — M5090 Cervical disc disorder, unspecified, unspecified cervical region: Secondary | ICD-10-CM | POA: Diagnosis not present

## 2020-04-01 DIAGNOSIS — G459 Transient cerebral ischemic attack, unspecified: Secondary | ICD-10-CM

## 2020-04-01 DIAGNOSIS — G20 Parkinson's disease: Secondary | ICD-10-CM | POA: Diagnosis not present

## 2020-04-01 MED ORDER — GABAPENTIN 300 MG PO CAPS: 300.0000 mg | ORAL_CAPSULE | Freq: Two times a day (BID) | ORAL | 3 refills | Status: DC

## 2020-04-01 NOTE — Addendum Note (Signed)
Addended by: Mal Misty on: 04/01/2020 04:18 PM   Modules accepted: Orders
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
## 2020-04-01 NOTE — Telephone Encounter (Signed)
[place] with [facility] called in reference to holding Asprin for a week for a biopsy of testicles

## 2020-04-01 NOTE — Progress Notes (Signed)
I agree with the above plan 

## 2020-04-01 NOTE — Telephone Encounter (Signed)
LMOM for William Norton at [facility] informing okay to hold aspirin for 1 week.

## 2020-04-01 NOTE — Patient Instructions (Addendum)
Your Plan:  Increase gabapentin to 300mg  twice daily - if difficulty tolerating morning dose, you can take both capsules at bedtime  Continue sinemet 1 tab 3 times daily  Would recommend repeat sleep study - this can be discussed further at follow up visit    Follow up in 6 months or call earlier if needed       Thank you for coming to see [place] at [facility]. I hope we have been able to provide you high quality care today.  You may receive a patient satisfaction survey over the next few weeks. We would appreciate your feedback and comments so that we may continue to improve ourselves and the health of our patients.

## 2020-04-01 NOTE — Telephone Encounter (Signed)
Okay to hold aspirin. 

## 2020-04-02 NOTE — Telephone Encounter (Signed)
Per [facility] needs it in witting that its ok to hold the Asprin for one week Per brittany they need note asap , please   Fax to number @ [phone]

## 2020-04-02 NOTE — Telephone Encounter (Signed)
Letter faxed.

## 2020-04-03 DIAGNOSIS — R69 Illness, unspecified: Secondary | ICD-10-CM | POA: Diagnosis not present

## 2020-04-08 ENCOUNTER — Other Ambulatory Visit: Payer: Self-pay | Admitting: Internal Medicine

## 2020-04-08 DIAGNOSIS — R69 Illness, unspecified: Secondary | ICD-10-CM | POA: Diagnosis not present

## 2020-04-09 DIAGNOSIS — R972 Elevated prostate specific antigen [PSA]: Secondary | ICD-10-CM | POA: Diagnosis not present

## 2020-04-09 DIAGNOSIS — C61 Malignant neoplasm of prostate: Secondary | ICD-10-CM | POA: Diagnosis not present

## 2020-04-10 DIAGNOSIS — R69 Illness, unspecified: Secondary | ICD-10-CM | POA: Diagnosis not present

## 2020-04-30 ENCOUNTER — Ambulatory Visit: Payer: Medicare HMO | Admitting: Internal Medicine

## 2020-05-01 DIAGNOSIS — R3915 Urgency of urination: Secondary | ICD-10-CM | POA: Diagnosis not present

## 2020-05-01 DIAGNOSIS — C61 Malignant neoplasm of prostate: Secondary | ICD-10-CM | POA: Diagnosis not present

## 2020-05-02 DIAGNOSIS — M4316 Spondylolisthesis, lumbar region: Secondary | ICD-10-CM | POA: Diagnosis not present

## 2020-05-02 DIAGNOSIS — M47816 Spondylosis without myelopathy or radiculopathy, lumbar region: Secondary | ICD-10-CM | POA: Diagnosis not present

## 2020-05-02 DIAGNOSIS — M48061 Spinal stenosis, lumbar region without neurogenic claudication: Secondary | ICD-10-CM | POA: Diagnosis not present

## 2020-05-06 DIAGNOSIS — C61 Malignant neoplasm of prostate: Secondary | ICD-10-CM | POA: Insufficient documentation

## 2020-05-06 DIAGNOSIS — Z1152 Encounter for screening for COVID-19: Secondary | ICD-10-CM | POA: Diagnosis not present

## 2020-05-20 ENCOUNTER — Ambulatory Visit (INDEPENDENT_AMBULATORY_CARE_PROVIDER_SITE_OTHER): Payer: Medicare HMO | Admitting: Internal Medicine

## 2020-05-20 ENCOUNTER — Encounter: Payer: Self-pay | Admitting: Internal Medicine

## 2020-05-20 ENCOUNTER — Other Ambulatory Visit: Payer: Self-pay

## 2020-05-20 VITALS — BP 145/82 | HR 71 | Temp 97.6°F | Resp 18 | Ht 71.0 in | Wt 238.2 lb

## 2020-05-20 DIAGNOSIS — E785 Hyperlipidemia, unspecified: Secondary | ICD-10-CM

## 2020-05-20 DIAGNOSIS — M48062 Spinal stenosis, lumbar region with neurogenic claudication: Secondary | ICD-10-CM | POA: Diagnosis not present

## 2020-05-20 DIAGNOSIS — I10 Essential (primary) hypertension: Secondary | ICD-10-CM

## 2020-05-20 DIAGNOSIS — D509 Iron deficiency anemia, unspecified: Secondary | ICD-10-CM | POA: Diagnosis not present

## 2020-05-20 DIAGNOSIS — Z23 Encounter for immunization: Secondary | ICD-10-CM | POA: Diagnosis not present

## 2020-05-20 DIAGNOSIS — E1159 Type 2 diabetes mellitus with other circulatory complications: Secondary | ICD-10-CM

## 2020-05-20 LAB — LIPID PANEL
Cholesterol: 120 mg/dL (ref 0–200)
HDL: 55.7 mg/dL (ref >39.00)
LDL Cholesterol: 40 mg/dL (ref 0–99)
NonHDL: 64.72
Total CHOL/HDL Ratio: 2
Triglycerides: 125 mg/dL (ref 0.0–149.0)
VLDL: 25 mg/dL (ref 0.0–40.0)

## 2020-05-20 LAB — BASIC METABOLIC PANEL
BUN: 19 mg/dL (ref 6–23)
CO2: 23 mEq/L (ref 19–32)
Calcium: 9.5 mg/dL (ref 8.4–10.5)
Chloride: 103 mEq/L (ref 96–112)
Creatinine, Ser: 0.92 mg/dL (ref 0.40–1.50)
GFR: 81.23 mL/min (ref >60.00)
Glucose, Bld: 100 mg/dL — ABNORMAL HIGH (ref 70–99)
Potassium: 5.1 mEq/L (ref 3.5–5.1)
Sodium: 136 mEq/L (ref 135–145)

## 2020-05-20 LAB — IRON: Iron: 36 ug/dL — ABNORMAL LOW (ref 42–165)

## 2020-05-20 LAB — CBC WITH DIFFERENTIAL/PLATELET
Basophils Absolute: 0.1 10*3/uL (ref 0.0–0.1)
Basophils Relative: 1 % (ref 0.0–3.0)
Eosinophils Absolute: 0.4 10*3/uL (ref 0.0–0.7)
Eosinophils Relative: 6.6 % — ABNORMAL HIGH (ref 0.0–5.0)
HCT: 32.6 % — ABNORMAL LOW (ref 39.0–52.0)
Hemoglobin: 10.4 g/dL — ABNORMAL LOW (ref 13.0–17.0)
Lymphocytes Relative: 30.3 % (ref 12.0–46.0)
Lymphs Abs: 1.9 10*3/uL (ref 0.7–4.0)
MCHC: 31.7 g/dL (ref 30.0–36.0)
MCV: 68.2 fl — ABNORMAL LOW (ref 78.0–100.0)
Monocytes Absolute: 0.6 10*3/uL (ref 0.1–1.0)
Monocytes Relative: 9.8 % (ref 3.0–12.0)
Neutro Abs: 3.2 10*3/uL (ref 1.4–7.7)
Neutrophils Relative %: 52.3 % (ref 43.0–77.0)
Platelets: 330 10*3/uL (ref 150.0–400.0)
RBC: 4.79 Mil/uL (ref 4.22–5.81)
RDW: 18.8 % — ABNORMAL HIGH (ref 11.5–15.5)
WBC: 6.1 10*3/uL (ref 4.0–10.5)

## 2020-05-20 LAB — FERRITIN: Ferritin: 15.6 ng/mL — ABNORMAL LOW (ref 22.0–322.0)

## 2020-05-20 LAB — HEMOGLOBIN A1C: Hgb A1c MFr Bld: 7.1 % — ABNORMAL HIGH (ref 4.6–6.5)
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
## 2020-05-20 NOTE — Patient Instructions (Addendum)
Per our records you are due for an eye exam. Please contact your eye doctor to schedule an appointment. Please have them send copies of your office visit notes to us. Our fax number is [phone].   Check the  blood pressure regularly BP GOAL is between 110/65 and  135/85. If it is consistently higher or lower, let me know     GO TO THE LAB : Get the blood work     GO TO THE FRONT DESK, PLEASE SCHEDULE YOUR APPOINTMENTS Come back for    a checkup in 4 months 

## 2020-05-20 NOTE — Progress Notes (Signed)
Pre visit review using our clinic review tool, if applicable. No additional management support is needed unless otherwise documented below in the visit note. 

## 2020-05-20 NOTE — Assessment & Plan Note (Signed)
DM: Based on last A1c, Actos was added, he also takes Metformin.  Ambulatory CBGs in the low 100s, check A1c. HTN: Continue Losartan, check a BMP. Hyperlipidemia: On Lipitor, last LFTs normal, check FLP Depression, anxiety: PHQ-9 19, score is elevated, however he feels okay.  Follow-up by psychiatry. GERD: See last visit, symptoms have decreased  Spinal stenosis: Continue to be his biggest issue, reports classic claudication, likely related to spinal stenosis, has good pedal pulses.  Not on painkillers, reports the gabapentin does provide some relief.  Rec to continue working with Dr. Maia Petties, will try to get his office visit notes. Anemia: See last visit, labs consistent with iron deficiency, rechecking labs.  To see GI soon. Prostate cancer: DX 03-2020, low risk situation, he agreed with urology on monitoring for now. RTC 4 months

## 2020-05-29 DIAGNOSIS — M47816 Spondylosis without myelopathy or radiculopathy, lumbar region: Secondary | ICD-10-CM | POA: Diagnosis not present

## 2020-05-29 DIAGNOSIS — M48061 Spinal stenosis, lumbar region without neurogenic claudication: Secondary | ICD-10-CM | POA: Diagnosis not present

## 2020-05-29 DIAGNOSIS — M4316 Spondylolisthesis, lumbar region: Secondary | ICD-10-CM | POA: Diagnosis not present

## 2020-05-29 DIAGNOSIS — M542 Cervicalgia: Secondary | ICD-10-CM | POA: Diagnosis not present

## 2020-05-29 DIAGNOSIS — M4712 Other spondylosis with myelopathy, cervical region: Secondary | ICD-10-CM | POA: Diagnosis not present

## 2020-06-06 ENCOUNTER — Ambulatory Visit: Payer: Medicare HMO | Admitting: Internal Medicine

## 2020-06-06 ENCOUNTER — Telehealth: Payer: Self-pay

## 2020-06-06 ENCOUNTER — Encounter: Payer: Self-pay | Admitting: Internal Medicine

## 2020-06-06 ENCOUNTER — Other Ambulatory Visit: Payer: Self-pay

## 2020-06-06 VITALS — BP 128/88 | HR 70 | Ht 71.0 in | Wt 235.8 lb

## 2020-06-06 DIAGNOSIS — D509 Iron deficiency anemia, unspecified: Secondary | ICD-10-CM | POA: Diagnosis not present

## 2020-06-06 DIAGNOSIS — I2510 Atherosclerotic heart disease of native coronary artery without angina pectoris: Secondary | ICD-10-CM

## 2020-06-06 DIAGNOSIS — K2270 Barrett's esophagus without dysplasia: Secondary | ICD-10-CM

## 2020-06-06 DIAGNOSIS — Z7902 Long term (current) use of antithrombotics/antiplatelets: Secondary | ICD-10-CM | POA: Diagnosis not present

## 2020-06-06 DIAGNOSIS — K2100 Gastro-esophageal reflux disease with esophagitis, without bleeding: Secondary | ICD-10-CM

## 2020-06-06 DIAGNOSIS — E6609 Other obesity due to excess calories: Secondary | ICD-10-CM

## 2020-06-06 DIAGNOSIS — R1319 Other dysphagia: Secondary | ICD-10-CM | POA: Diagnosis not present

## 2020-06-06 DIAGNOSIS — Z6832 Body mass index (BMI) 32.0-32.9, adult: Secondary | ICD-10-CM

## 2020-06-06 DIAGNOSIS — E8881 Metabolic syndrome: Secondary | ICD-10-CM | POA: Diagnosis not present

## 2020-06-06 MED ORDER — FAMOTIDINE 40 MG PO TABS: 40.0000 mg | ORAL_TABLET | Freq: Every day | ORAL | 0 refills | Status: DC

## 2020-06-06 NOTE — Patient Instructions (Signed)
Normal BMI (Body Mass Index- based on height and weight) is between 23 and 30. Your BMI today is Body mass index is 32.89 kg/m. Marland Kitchen Please consider follow up  regarding your BMI with your Primary Care Provider.  We have sent the following medications to your pharmacy for you to pick up at your convenience: Pepcid  Please take your iron supplement but hold it 3 days prior to your procedure.  You will be contacted by our office prior to your procedure for directions on holding your Plavix.  If you do not hear from our office 1 week prior to your scheduled procedure, please call [phone] to discuss.   You have been scheduled for an endoscopy and colonoscopy. Please follow the written instructions given to you at your visit today. Please pick up your prep supplies at the pharmacy within the next 1-3 days. If you use inhalers (even only as needed), please bring them with you on the day of your procedure.  I appreciate the opportunity to care for you. Silvano Rusk, MD, [facility]

## 2020-06-06 NOTE — Telephone Encounter (Signed)
[facility] Pre-operative Risk Assessment     Request for surgical clearance:     Endoscopy Procedure  What type of surgery is being performed?     EGD and colonoscopy  When is this surgery scheduled?     07/29/2020  What type of clearance is required ?   Pharmacy  Are there any medications that need to be held prior to surgery and how long? Plavix, 5 days  Practice name and name of physician performing surgery?      [facility]  What is your office phone and fax number?      Phone- [phone]  [phone]  Anesthesia type (None, local, MAC, general) ?       MAC
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
## 2020-06-06 NOTE — Telephone Encounter (Signed)
   Primary Cardiologist: Candee Furbish, MD  Chart reviewed as part of pre-operative protocol coverage. Because of William Norton's past medical history and time since last visit, he will require a follow-up visit in order to better assess preoperative cardiovascular risk.  Needs in person office visit. Last seen in person in 2019; had telemed visit 08/2018 but even this is >1 year ago.  Pre-op covering staff: - Please schedule appointment and call patient to inform them. If patient already had an upcoming appointment within acceptable timeframe, please add "pre-op clearance" to the appointment notes so provider is aware. - Please contact requesting surgeon's office via preferred method (i.e, phone, fax) to inform them of need for appointment prior to surgery.  Will hold off on routing to MD re: Plavix because last note indicates patient was no longer on it. This can be clarified at his office visit.   [name], PA-C  06/06/2020, 12:43 PM

## 2020-06-07 NOTE — Telephone Encounter (Signed)
Pt has been made aware he will need a pre op appt for clearance. Pt has been scheduled to see Richardson Dopp, [facility] 07/24/20 @ 1:45. Pt thanked me for the call and the help.

## 2020-06-12 DIAGNOSIS — M4802 Spinal stenosis, cervical region: Secondary | ICD-10-CM | POA: Diagnosis not present

## 2020-06-12 DIAGNOSIS — M4803 Spinal stenosis, cervicothoracic region: Secondary | ICD-10-CM | POA: Diagnosis not present

## 2020-06-12 DIAGNOSIS — M4712 Other spondylosis with myelopathy, cervical region: Secondary | ICD-10-CM | POA: Diagnosis not present

## 2020-06-12 DIAGNOSIS — G9589 Other specified diseases of spinal cord: Secondary | ICD-10-CM | POA: Diagnosis not present

## 2020-06-24 DIAGNOSIS — Z20822 Contact with and (suspected) exposure to covid-19: Secondary | ICD-10-CM | POA: Diagnosis not present

## 2020-06-27 DIAGNOSIS — M47816 Spondylosis without myelopathy or radiculopathy, lumbar region: Secondary | ICD-10-CM | POA: Diagnosis not present

## 2020-06-27 DIAGNOSIS — M4712 Other spondylosis with myelopathy, cervical region: Secondary | ICD-10-CM | POA: Diagnosis not present

## 2020-06-27 DIAGNOSIS — M4316 Spondylolisthesis, lumbar region: Secondary | ICD-10-CM | POA: Diagnosis not present

## 2020-06-27 DIAGNOSIS — M48061 Spinal stenosis, lumbar region without neurogenic claudication: Secondary | ICD-10-CM | POA: Diagnosis not present

## 2020-07-17 DIAGNOSIS — H524 Presbyopia: Secondary | ICD-10-CM | POA: Diagnosis not present

## 2020-07-17 LAB — HM DIABETES EYE EXAM

## 2020-07-22 DIAGNOSIS — H52223 Regular astigmatism, bilateral: Secondary | ICD-10-CM | POA: Diagnosis not present

## 2020-07-22 DIAGNOSIS — H524 Presbyopia: Secondary | ICD-10-CM | POA: Diagnosis not present

## 2020-07-24 ENCOUNTER — Encounter: Payer: Self-pay | Admitting: Physician Assistant

## 2020-07-24 ENCOUNTER — Other Ambulatory Visit: Payer: Self-pay

## 2020-07-24 ENCOUNTER — Ambulatory Visit: Payer: Medicare HMO | Admitting: Physician Assistant

## 2020-07-24 VITALS — BP 140/80 | HR 98 | Ht 71.0 in | Wt 237.0 lb

## 2020-07-24 DIAGNOSIS — I2510 Atherosclerotic heart disease of native coronary artery without angina pectoris: Secondary | ICD-10-CM

## 2020-07-24 DIAGNOSIS — E782 Mixed hyperlipidemia: Secondary | ICD-10-CM | POA: Diagnosis not present

## 2020-07-24 DIAGNOSIS — I10 Essential (primary) hypertension: Secondary | ICD-10-CM

## 2020-07-24 DIAGNOSIS — Z01810 Encounter for preprocedural cardiovascular examination: Secondary | ICD-10-CM

## 2020-07-24 DIAGNOSIS — R0602 Shortness of breath: Secondary | ICD-10-CM

## 2020-07-24 MED ORDER — FUROSEMIDE 40 MG PO TABS: ORAL_TABLET | ORAL | 6 refills | Status: DC

## 2020-07-24 NOTE — Patient Instructions (Addendum)
Medication Instructions:  Your physician has recommended you make the following change in your medication:  1.  START Lasix 40 mg taking 1 tablet daily for 3 days then take only as needed for swelling  *If you need a refill on your cardiac medications before your next appointment, please call your pharmacy*   Lab Work: TODAY:  CMET, PRO BNP, & CBC  07/31/2020:  BMET  If you have labs (blood work) drawn today and your tests are completely normal, you will receive your results only by: Marland Kitchen MyChart Message (if you have MyChart) OR . A paper copy in the mail If you have any lab test that is abnormal or we need to change your treatment, we will call you to review the results.   Testing/Procedures: Your physician has requested that you have an echocardiogram. Echocardiography is a painless test that uses sound waves to create images of your heart. It provides your doctor with information about the size and shape of your heart and how well your heart's chambers and valves are working. This procedure takes approximately one hour. There are no restrictions for this procedure.     Follow-Up: At [facility] you and your health needs are our priority.  As part of our continuing mission to provide you with exceptional heart care, we have created designated Provider Care Teams.  These Care Teams include your primary Cardiologist (physician) and Advanced Practice Providers (APPs -  Physician Assistants and Nurse Practitioners) who all work together to provide you with the care you need, when you need it.  We recommend signing up for the patient portal called "MyChart".  Sign up information is provided on this After Visit Summary.  MyChart is used to connect with patients for Virtual Visits (Telemedicine).  Patients are able to view lab/test results, encounter notes, upcoming appointments, etc.  Non-urgent messages can be sent to your provider as well.   To learn more about what you can do with MyChart,  go to [email or web address].    Your next appointment:   07/31/2020 ARRIVE AT 3:00   The format for your next appointment:   In Person  Provider:   You may see Candee Furbish, MD    Other Instructions Please call and make a follow-up appointment with the Lung dr  Echocardiogram An echocardiogram is a test that uses sound waves (ultrasound) to produce images of the heart. Images from an echocardiogram can provide important information about:  Heart size and shape.  The size and thickness and movement of your heart's walls.  Heart muscle function and strength.  Heart valve function or if you have stenosis. Stenosis is when the heart valves are too narrow.  If blood is flowing backward through the heart valves (regurgitation).  A tumor or infectious growth around the heart valves.  Areas of heart muscle that are not working well because of poor blood flow or injury from a heart attack.  Aneurysm detection. An aneurysm is a weak or damaged part of an artery wall. The wall bulges out from the normal force of blood pumping through the body. Tell a health care provider about:  Any allergies you have.  All medicines you are taking, including vitamins, herbs, eye drops, creams, and over-the-counter medicines.  Any blood disorders you have.  Any surgeries you have had.  Any medical conditions you have.  Whether you are pregnant or may be pregnant. What are the risks? Generally, this is a safe test. However, problems may occur, including an allergic  reaction to dye (contrast) that may be used during the test. What happens before the test? No specific preparation is needed. You may eat and drink normally. What happens during the test?  You will take off your clothes from the waist up and put on a hospital gown.  Electrodes or electrocardiogram (ECG)patches may be placed on your chest. The electrodes or patches are then connected to a device that monitors your heart rate and  rhythm.  You will lie down on a table for an ultrasound exam. A gel will be applied to your chest to help sound waves pass through your skin.  A handheld device, called a transducer, will be pressed against your chest and moved over your heart. The transducer produces sound waves that travel to your heart and bounce back (or "echo" back) to the transducer. These sound waves will be captured in real-time and changed into images of your heart that can be viewed on a video monitor. The images will be recorded on a computer and reviewed by your health care provider.  You may be asked to change positions or hold your breath for a short time. This makes it easier to get different views or better views of your heart.  In some cases, you may receive contrast through an IV in one of your veins. This can improve the quality of the pictures from your heart. The procedure may vary among health care providers and hospitals.   What can I expect after the test? You may return to your normal, everyday life, including diet, activities, and medicines, unless your health care provider tells you not to do that. Follow these instructions at home:  It is up to you to get the results of your test. Ask your health care provider, or the department that is doing the test, when your results will be ready.  Keep all follow-up visits. This is important. Summary  An echocardiogram is a test that uses sound waves (ultrasound) to produce images of the heart.  Images from an echocardiogram can provide important information about the size and shape of your heart, heart muscle function, heart valve function, and other possible heart problems.  You do not need to do anything to prepare before this test. You may eat and drink normally.  After the echocardiogram is completed, you may return to your normal, everyday life, unless your health care provider tells you not to do that. This information is not intended to replace  advice given to you by your health care provider. Make sure you discuss any questions you have with your health care provider. Document Revised: 12/05/2019 Document Reviewed: 12/05/2019 Elsevier Patient Education  2021 [employer].
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
## 2020-07-25 ENCOUNTER — Telehealth: Payer: Self-pay | Admitting: Internal Medicine

## 2020-07-25 LAB — COMPREHENSIVE METABOLIC PANEL
ALT: 51 IU/L — ABNORMAL HIGH (ref 0–44)
AST: 28 IU/L (ref 0–40)
Albumin/Globulin Ratio: 1.7 (ref 1.2–2.2)
Albumin: 4.7 g/dL (ref 3.7–4.7)
Alkaline Phosphatase: 65 IU/L (ref 44–121)
BUN/Creatinine Ratio: 19 (ref 10–24)
BUN: 16 mg/dL (ref 8–27)
Bilirubin Total: 0.9 mg/dL (ref 0.0–1.2)
CO2: 17 mmol/L — ABNORMAL LOW (ref 20–29)
Calcium: 9.2 mg/dL (ref 8.6–10.2)
Chloride: 104 mmol/L (ref 96–106)
Creatinine, Ser: 0.86 mg/dL (ref 0.76–1.27)
Globulin, Total: 2.7 g/dL (ref 1.5–4.5)
Glucose: 104 mg/dL — ABNORMAL HIGH (ref 65–99)
Potassium: 4.8 mmol/L (ref 3.5–5.2)
Sodium: 138 mmol/L (ref 134–144)
Total Protein: 7.4 g/dL (ref 6.0–8.5)
eGFR: 90 mL/min/{1.73_m2} (ref >59)

## 2020-07-25 LAB — CBC
Hematocrit: 41.7 % (ref 37.5–51.0)
Hemoglobin: 13.2 g/dL (ref 13.0–17.7)
MCH: 24.2 pg — ABNORMAL LOW (ref 26.6–33.0)
MCHC: 31.7 g/dL (ref 31.5–35.7)
MCV: 77 fL — ABNORMAL LOW (ref 79–97)
Platelets: 277 10*3/uL (ref 150–450)
RBC: 5.45 x10E6/uL (ref 4.14–5.80)
WBC: 10 10*3/uL (ref 3.4–10.8)

## 2020-07-25 LAB — PRO B NATRIURETIC PEPTIDE: NT-Pro BNP: 139 pg/mL (ref 0–486)

## 2020-07-25 NOTE — Telephone Encounter (Signed)
Patient's appointment cancelled and William Norton aware of the plan.

## 2020-07-25 NOTE — Telephone Encounter (Signed)
    Pt seen in office yesterday for surgical clearance.  He has had worsening shortness of breath and there is some concern for volume excess. His Hgb is normal. I have placed him on Furosemide and he has f/u with Dr. Marlou Porch next week (07/31/20). His EGD/colo should be postponed until after he sees Dr. Marlou Porch in f/u to reevaluate him. Thanks, Richardson Dopp, PA-C    07/25/2020 9:39 AM

## 2020-07-25 NOTE — Telephone Encounter (Signed)
Appreciate the follow-up Scott.  We will await notification from [facility] after he sees him regarding scheduling.  PJ call patient and explained the plan that we will wait till he sees Dr. Marlou Porch again

## 2020-07-25 NOTE — Telephone Encounter (Signed)
Called pt to remind him of his appointment and he stated his heart doctor said it was not a good time right now to have procedure at this time. (See note)

## 2020-07-25 NOTE — Telephone Encounter (Signed)
Thanks-aware from another telephone note from cardiology

## 2020-07-26 NOTE — Telephone Encounter (Signed)
Agree with plan. Thanks Demetries Coia, MD  

## 2020-07-29 ENCOUNTER — Encounter: Payer: Medicare HMO | Admitting: Internal Medicine

## 2020-07-31 ENCOUNTER — Ambulatory Visit: Payer: Medicare HMO | Admitting: Cardiology

## 2020-07-31 ENCOUNTER — Other Ambulatory Visit: Payer: Medicare HMO | Admitting: *Deleted

## 2020-07-31 ENCOUNTER — Encounter: Payer: Self-pay | Admitting: Cardiology

## 2020-07-31 ENCOUNTER — Other Ambulatory Visit: Payer: Self-pay

## 2020-07-31 VITALS — BP 120/70 | HR 94 | Ht 71.0 in | Wt 238.0 lb

## 2020-07-31 DIAGNOSIS — I10 Essential (primary) hypertension: Secondary | ICD-10-CM

## 2020-07-31 DIAGNOSIS — I2510 Atherosclerotic heart disease of native coronary artery without angina pectoris: Secondary | ICD-10-CM | POA: Diagnosis not present

## 2020-07-31 DIAGNOSIS — Z01810 Encounter for preprocedural cardiovascular examination: Secondary | ICD-10-CM

## 2020-07-31 DIAGNOSIS — R0602 Shortness of breath: Secondary | ICD-10-CM | POA: Diagnosis not present

## 2020-07-31 DIAGNOSIS — E782 Mixed hyperlipidemia: Secondary | ICD-10-CM | POA: Diagnosis not present

## 2020-07-31 MED ORDER — FUROSEMIDE 40 MG PO TABS: ORAL_TABLET | ORAL | 6 refills | Status: DC
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
## 2020-07-31 NOTE — Patient Instructions (Signed)
Medication Instructions:  Please take Furosemide 40 mg daily. Continue all other medications as listed.  *If you need a refill on your cardiac medications before your next appointment, please call your pharmacy*  Follow-Up: At [facility], you and your health needs are our priority.  As part of our continuing mission to provide you with exceptional heart care, we have created designated Provider Care Teams.  These Care Teams include your primary Cardiologist (physician) and Advanced Practice Providers (APPs -  Physician Assistants and Nurse Practitioners) who all work together to provide you with the care you need, when you need it.  We recommend signing up for the patient portal called "MyChart".  Sign up information is provided on this After Visit Summary.  MyChart is used to connect with patients for Virtual Visits (Telemedicine).  Patients are able to view lab/test results, encounter notes, upcoming appointments, etc.  Non-urgent messages can be sent to your provider as well.   To learn more about what you can do with MyChart, go to [email or web address].    Your next appointment:   6 month(s)  The format for your next appointment:   In Person  Provider:   Candee Furbish, MD  Thank you for choosing [facility]!!

## 2020-08-01 LAB — BASIC METABOLIC PANEL
BUN/Creatinine Ratio: 19 (ref 10–24)
BUN: 20 mg/dL (ref 8–27)
CO2: 18 mmol/L — ABNORMAL LOW (ref 20–29)
Calcium: 9.4 mg/dL (ref 8.6–10.2)
Chloride: 102 mmol/L (ref 96–106)
Creatinine, Ser: 1.05 mg/dL (ref 0.76–1.27)
Glucose: 156 mg/dL — ABNORMAL HIGH (ref 65–99)
Potassium: 4.4 mmol/L (ref 3.5–5.2)
Sodium: 139 mmol/L (ref 134–144)
eGFR: 74 mL/min/{1.73_m2} (ref >59)

## 2020-08-12 NOTE — Telephone Encounter (Signed)
Inbound call from patient stating he did get clearance from cardiologist to do procedure.  Ok to schedule?

## 2020-08-12 NOTE — Telephone Encounter (Signed)
We need documentation from Cardiology before scheduling. I do not see documentation from Cardiology. We routed them a clearance request, until we have that back patient can not be scheduled. If patient states that he was cleared then he should reach out to cardiology and ask that they send confirmation of clearance.

## 2020-08-13 NOTE — Telephone Encounter (Signed)
Called patient and left voice mail informing him we need confirmation from his cardiologist.

## 2020-08-20 ENCOUNTER — Other Ambulatory Visit: Payer: Self-pay | Admitting: Internal Medicine

## 2020-08-22 ENCOUNTER — Ambulatory Visit (HOSPITAL_COMMUNITY): Payer: Medicare HMO | Attending: Cardiology

## 2020-08-22 ENCOUNTER — Other Ambulatory Visit: Payer: Self-pay

## 2020-08-22 DIAGNOSIS — I2510 Atherosclerotic heart disease of native coronary artery without angina pectoris: Secondary | ICD-10-CM | POA: Diagnosis not present

## 2020-08-22 DIAGNOSIS — E782 Mixed hyperlipidemia: Secondary | ICD-10-CM | POA: Insufficient documentation

## 2020-08-22 DIAGNOSIS — R0602 Shortness of breath: Secondary | ICD-10-CM | POA: Diagnosis not present

## 2020-08-22 DIAGNOSIS — Z01810 Encounter for preprocedural cardiovascular examination: Secondary | ICD-10-CM

## 2020-08-22 DIAGNOSIS — I10 Essential (primary) hypertension: Secondary | ICD-10-CM | POA: Diagnosis not present

## 2020-08-22 LAB — ECHOCARDIOGRAM COMPLETE
Area-P 1/2: 2.73 cm2
S' Lateral: 3.7 cm

## 2020-08-22 MED ORDER — PERFLUTREN LIPID MICROSPHERE
1.0000 mL | INTRAVENOUS | Status: AC | PRN
  Administered 2020-08-22: 2 mL via INTRAVENOUS

## 2020-08-23 ENCOUNTER — Encounter: Payer: Self-pay | Admitting: Physician Assistant

## 2020-08-23 ENCOUNTER — Other Ambulatory Visit: Payer: Self-pay | Admitting: *Deleted

## 2020-08-23 DIAGNOSIS — I77810 Thoracic aortic ectasia: Secondary | ICD-10-CM | POA: Insufficient documentation

## 2020-08-23 NOTE — Progress Notes (Signed)
Message routed to Dr. Carlean Purl to determine if he would like pt to be seen and cleared by pulmonary before rescheduling. Will await his response upon his return Monday.

## 2020-08-29 ENCOUNTER — Telehealth: Payer: Self-pay

## 2020-08-29 NOTE — Telephone Encounter (Signed)
At Dr. Celesta Aver request, called pt to reschedule double in [place]. LVM requesting returned call. Will also need to provide prep instructions along with instructions for holding Plavix. Will cont efforts to reach pt.

## 2020-08-29 NOTE — Telephone Encounter (Signed)
SECOND ATTEMPT:  Called pt to reschedule double procedure. Agreed to be rescheduled on 11/13/20 @ [facility]. Pt requested new prep instructions be mailed to his home address. Mailing address confirmed with pt. Provided pt with instructions for holding his Plavix and advised these instructions will be written in his instructions as well as how to take his diabetic medications. Verbalized acceptance and understanding. Instructions have also been sent via My Chart in addition to being mailed.

## 2020-08-29 NOTE — Progress Notes (Signed)
Opened new TE to reschedule double in [place]. LVM. Closing this chart and will reference cardiac instructions when needed.

## 2020-08-29 NOTE — Telephone Encounter (Signed)
-----   Message from Gatha Mayer, MD sent at 08/29/2020  8:21 AM EDT ----- Faythe Ghee to proceed with EGD and colonoscopy.  I do not need pulmonary clearance.  Hold clopidogrel 5 days prior.   Thanks ----- Message ----- From: Aleatha Borer, LPN Sent: 12/20/35   2:23 PM EDT To: Gatha Mayer, MD  Please review response below and advise if pt will be required to see pulmonary before rescheduling.  Thank you, Ellington Cornia ----- Message ----- From: Sharmon Revere Sent: 08/23/2020   8:11 AM EDT To: Patti E Martinique, CMA  His echocardiogram showed normal EF.  Not sure if Dr. Marlou Porch sent you his note so I am sending it here. According to Dr. Marlou Porch' note, he is ok to proceed from a cardiac standpoint.  Of note, he was told to f/u with Pulmonology as well. Richardson Dopp, PA-C    08/23/2020 8:11 AM

## 2020-09-07 ENCOUNTER — Other Ambulatory Visit: Payer: Self-pay | Admitting: Internal Medicine

## 2020-09-10 ENCOUNTER — Other Ambulatory Visit: Payer: Self-pay | Admitting: Internal Medicine

## 2020-09-17 ENCOUNTER — Encounter: Payer: Self-pay | Admitting: Internal Medicine

## 2020-09-17 ENCOUNTER — Ambulatory Visit (INDEPENDENT_AMBULATORY_CARE_PROVIDER_SITE_OTHER): Payer: Medicare HMO | Admitting: Internal Medicine

## 2020-09-17 ENCOUNTER — Ambulatory Visit: Payer: Medicare HMO | Admitting: Internal Medicine

## 2020-09-17 ENCOUNTER — Other Ambulatory Visit: Payer: Self-pay | Admitting: Internal Medicine

## 2020-09-17 ENCOUNTER — Other Ambulatory Visit: Payer: Self-pay

## 2020-09-17 VITALS — BP 126/82 | HR 88 | Temp 97.9°F | Resp 18 | Ht 71.0 in | Wt 227.0 lb

## 2020-09-17 DIAGNOSIS — D509 Iron deficiency anemia, unspecified: Secondary | ICD-10-CM | POA: Diagnosis not present

## 2020-09-17 DIAGNOSIS — E1159 Type 2 diabetes mellitus with other circulatory complications: Secondary | ICD-10-CM | POA: Diagnosis not present

## 2020-09-17 DIAGNOSIS — F32A Depression, unspecified: Secondary | ICD-10-CM

## 2020-09-17 DIAGNOSIS — I10 Essential (primary) hypertension: Secondary | ICD-10-CM | POA: Diagnosis not present

## 2020-09-17 DIAGNOSIS — K219 Gastro-esophageal reflux disease without esophagitis: Secondary | ICD-10-CM

## 2020-09-17 DIAGNOSIS — R69 Illness, unspecified: Secondary | ICD-10-CM | POA: Diagnosis not present

## 2020-09-17 DIAGNOSIS — F419 Anxiety disorder, unspecified: Secondary | ICD-10-CM

## 2020-09-17 NOTE — Patient Instructions (Addendum)
Check the  blood pressure weekly  BP GOAL is between 110/65 and  135/85. If it is consistently higher or lower, let me know   GO TO THE LAB : Get the blood work     [place], [place] back for a checkup in 4 months
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
## 2020-09-18 LAB — HEMOGLOBIN A1C: Hgb A1c MFr Bld: 6.6 % — ABNORMAL HIGH (ref 4.6–6.5)

## 2020-09-18 NOTE — Assessment & Plan Note (Signed)
DM: On metformin, Actos, last A1c improving, recheck A1c. HTN: On losartan, BP today is very good, last BMP satisfactory.  No change iron deficiency anemia, recurrent: Saw GI 06/06/2020, they rec EGD and C scope (scheduled for 11/13/2020).  Also considering small bowel capsule. CAD: Saw cardiology 07/31/2020, had SOB, was Rx Lasix Echo 08/22/2020: Normal EF, mild ascending aorta dilatation thus next echo: 1 year. Patient reports he tried Lasix for few days but it made him urinate too much and subsequently he stopped it.  Currently with no SOB/cough/DOE. Depression, anxiety: Self stop an antidepressant few weeks ago (name) and plans to stop Abilify as well  b/c he is feeling well.  Advised to discuss with psychiatry but he has some problems getting an appointment with them. Offered to RF Abilify until he sees psychiatry but he declined; seems determined to stop Abilify thus rec to call me or psychiatry if symptoms resurface GERD: Self DC Pepcid, encouraged to continue with Protonix.  Symptoms controlled. RTC 4 months

## 2020-09-30 ENCOUNTER — Ambulatory Visit: Payer: Medicare HMO | Admitting: Adult Health

## 2020-10-14 ENCOUNTER — Ambulatory Visit: Payer: Medicare HMO | Admitting: Adult Health

## 2020-10-14 ENCOUNTER — Encounter: Payer: Self-pay | Admitting: Adult Health

## 2020-10-14 VITALS — BP 126/79 | HR 73 | Ht 71.0 in | Wt 222.0 lb

## 2020-10-14 DIAGNOSIS — G459 Transient cerebral ischemic attack, unspecified: Secondary | ICD-10-CM

## 2020-10-14 DIAGNOSIS — G20 Parkinson's disease: Secondary | ICD-10-CM | POA: Diagnosis not present

## 2020-10-14 DIAGNOSIS — R269 Unspecified abnormalities of gait and mobility: Secondary | ICD-10-CM

## 2020-10-14 MED ORDER — CARBIDOPA-LEVODOPA 25-100 MG PO TABS: 1.0000 | ORAL_TABLET | Freq: Three times a day (TID) | ORAL | 3 refills | Status: DC
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
## 2020-10-14 NOTE — Patient Instructions (Addendum)
Continue Sinemet 1 tab 3 times daily  Increase gabapentin from 300 mg nightly to 300mg  twice daily.  If difficulty tolerating during the day, you can take both 300mg  capsules at night - this medication can help with the tremors and painful numbness/tingling sensation    Follow-up in 6 months or call earlier if needed

## 2020-10-24 ENCOUNTER — Telehealth: Payer: Self-pay

## 2020-10-24 NOTE — Telephone Encounter (Signed)
[facility] called stating they need a record release form signed before they can release patient's records

## 2020-10-25 NOTE — Progress Notes (Signed)
I agree with the above plan 

## 2020-10-25 NOTE — Telephone Encounter (Signed)
Will make note to get signed release next time he is here. TY.

## 2020-11-05 DIAGNOSIS — R3915 Urgency of urination: Secondary | ICD-10-CM | POA: Diagnosis not present

## 2020-11-05 DIAGNOSIS — R3912 Poor urinary stream: Secondary | ICD-10-CM | POA: Diagnosis not present

## 2020-11-05 DIAGNOSIS — N401 Enlarged prostate with lower urinary tract symptoms: Secondary | ICD-10-CM | POA: Diagnosis not present

## 2020-11-05 DIAGNOSIS — C61 Malignant neoplasm of prostate: Secondary | ICD-10-CM | POA: Diagnosis not present

## 2020-11-05 DIAGNOSIS — R350 Frequency of micturition: Secondary | ICD-10-CM | POA: Diagnosis not present

## 2020-11-12 DIAGNOSIS — R69 Illness, unspecified: Secondary | ICD-10-CM | POA: Diagnosis not present

## 2020-11-12 DIAGNOSIS — Z79891 Long term (current) use of opiate analgesic: Secondary | ICD-10-CM | POA: Diagnosis not present

## 2020-11-13 ENCOUNTER — Encounter: Payer: Self-pay | Admitting: Internal Medicine

## 2020-11-13 ENCOUNTER — Other Ambulatory Visit: Payer: Self-pay

## 2020-11-13 ENCOUNTER — Ambulatory Visit (AMBULATORY_SURGERY_CENTER): Payer: Medicare HMO | Admitting: Internal Medicine

## 2020-11-13 VITALS — BP 127/79 | HR 67 | Temp 98.6°F | Resp 15 | Ht 71.0 in | Wt 235.0 lb

## 2020-11-13 DIAGNOSIS — K257 Chronic gastric ulcer without hemorrhage or perforation: Secondary | ICD-10-CM | POA: Diagnosis not present

## 2020-11-13 DIAGNOSIS — K2270 Barrett's esophagus without dysplasia: Secondary | ICD-10-CM

## 2020-11-13 DIAGNOSIS — K648 Other hemorrhoids: Secondary | ICD-10-CM | POA: Diagnosis not present

## 2020-11-13 DIAGNOSIS — K2100 Gastro-esophageal reflux disease with esophagitis, without bleeding: Secondary | ICD-10-CM

## 2020-11-13 DIAGNOSIS — I10 Essential (primary) hypertension: Secondary | ICD-10-CM | POA: Diagnosis not present

## 2020-11-13 DIAGNOSIS — K219 Gastro-esophageal reflux disease without esophagitis: Secondary | ICD-10-CM | POA: Diagnosis not present

## 2020-11-13 DIAGNOSIS — K449 Diaphragmatic hernia without obstruction or gangrene: Secondary | ICD-10-CM

## 2020-11-13 DIAGNOSIS — D509 Iron deficiency anemia, unspecified: Secondary | ICD-10-CM | POA: Diagnosis not present

## 2020-11-13 DIAGNOSIS — I2510 Atherosclerotic heart disease of native coronary artery without angina pectoris: Secondary | ICD-10-CM | POA: Diagnosis not present

## 2020-11-13 DIAGNOSIS — G4733 Obstructive sleep apnea (adult) (pediatric): Secondary | ICD-10-CM | POA: Diagnosis not present

## 2020-11-13 DIAGNOSIS — D500 Iron deficiency anemia secondary to blood loss (chronic): Secondary | ICD-10-CM

## 2020-11-13 DIAGNOSIS — E119 Type 2 diabetes mellitus without complications: Secondary | ICD-10-CM | POA: Diagnosis not present

## 2020-11-13 DIAGNOSIS — Z86010 Personal history of colonic polyps: Secondary | ICD-10-CM | POA: Diagnosis not present

## 2020-11-13 MED ORDER — SODIUM CHLORIDE 0.9 % IV SOLN
500.0000 mL | Freq: Once | INTRAVENOUS | Status: DC
Start: 2020-11-13 — End: 2020-11-13

## 2020-11-13 NOTE — Op Note (Signed)
[facility] Patient Name: William Norton Procedure Date: 11/13/2020 9:10 AM MRN: [id number] Endoscopist: Gatha Mayer , MD Age: 75 Referring MD:  Date of Birth: 18-Jul-1945 Gender: Male Account #: [id number] Procedure:                Upper GI endoscopy Indications:              Iron deficiency anemia, Follow-up of Barrett's                            esophagus Medicines:                Propofol per Anesthesia, Monitored Anesthesia Care Procedure:                Pre-Anesthesia Assessment:                           - Prior to the procedure, a History and Physical                            was performed, and patient medications and                            allergies were reviewed. The patient's tolerance of                            previous anesthesia was also reviewed. The risks                            and benefits of the procedure and the sedation                            options and risks were discussed with the patient.                            All questions were answered, and informed consent                            was obtained. Prior Anticoagulants: The patient has                            taken no previous anticoagulant or antiplatelet                            agents. ASA Grade Assessment: III - A patient with                            severe systemic disease. After reviewing the risks                            and benefits, the patient was deemed in                            satisfactory condition to undergo the procedure.  After obtaining informed consent, the endoscope was                            passed under direct vision. Throughout the                            procedure, the patient's blood pressure, pulse, and                            oxygen saturations were monitored continuously. The                            Endoscope was introduced through the mouth, and                            advanced to the second  part of duodenum. The upper                            GI endoscopy was accomplished without difficulty.                            The patient tolerated the procedure well. Scope In: Scope Out: Findings:                 There were esophageal mucosal changes secondary to                            established short-segment Barrett's disease present                            in the lower third of the esophagus. The maximum                            longitudinal extent of these mucosal changes was 2                            cm in length. Mucosa was biopsied with a cold                            forceps for histology. One specimen bottle was sent                            to pathology. Verification of patient                            identification for the specimen was done. Estimated                            blood loss was minimal.                           A 6 cm hiatal hernia with multiple Cameron ulcers  was found. The proximal extent of the gastric folds                            (end of tubular esophagus) was 41 cm from the                            incisors. The Z-line was 35 cm from the incisors.                           The gastroesophageal flap valve was visualized                            endoscopically and classified as Hill Grade IV (no                            fold, wide open lumen, hiatal hernia present).                           The exam was otherwise without abnormality. Complications:            No immediate complications. Estimated Blood Loss:     Estimated blood loss was minimal. Estimated blood                            loss was minimal. Impression:               - Esophageal mucosal changes secondary to                            established short-segment Barrett's disease.                            Biopsied.                           - 6 cm hiatal hernia with multiple Cameron ulcers.                            THESE ARE  CAUSE OF IRON DEFICIENCY - SLOW CGRONIC                            BLOOD LOSS FROM THESE - ONLTY FIX IS HIATAL HERNIA                            REPAIR AND I DO NOT RECOMMEND AT HIS AGE                           - Gastroesophageal flap valve classified as Hill                            Grade IV (no fold, wide open lumen, hiatal hernia                            present).                           -  The examination was otherwise normal. Recommendation:           - Patient has a contact number available for                            emergencies. The signs and symptoms of potential                            delayed complications were discussed with the                            patient. Return to normal activities tomorrow.                            Written discharge instructions were provided to the                            patient.                           - Resume previous diet.                           - Continue present medications. STAY ON IRON                            SUPPLEMENTS INDEFINITELY AND SEE PCP REGARDING IRON                            DEFICIENCY                           - See the other procedure note for documentation of                            additional recommendations.                           - Await pathology results. Do not anticipate he                            will need a surveillance EGD for Barrett's Gatha Mayer, MD 11/13/2020 9:56:40 AM This report has been signed electronically.

## 2020-11-13 NOTE — Progress Notes (Signed)
VS-CW 

## 2020-11-13 NOTE — Progress Notes (Signed)
Called to room to assist during endoscopic procedure.  Patient ID and intended procedure confirmed with present staff. Received instructions for my participation in the procedure from the performing physician.  

## 2020-11-13 NOTE — Progress Notes (Signed)
Report to PACU, RN, vss, BBS= Clear.  

## 2020-11-13 NOTE — Patient Instructions (Addendum)
The main finding on the upper examination is the hiatal hernia and associated erosions called Cameron erosions or ulcers.  These leak blood and I believe are responsible for your recurrent iron deficiency.  What happens is the diaphragms pinch on the stomach and create little ulcers.  The only fix for these is surgery and that is not worth the risk in you.  Continuing iron makes sense.  Stay on the iron and have Dr. Larose Kells follow that up.  The Barrett's esophagus looks the same I took biopsies we probably will not need to check that again.  If you remember that is a precancerous condition with an extremely rare risk of cancer over the years and incredibly unlikely to ever affect you.  Colonoscopy was okay other than internal hemorrhoids as expected.  No more routine colonoscopy exam needed.   I appreciate the opportunity to care for you. Gatha Mayer, MD, [facility]   Discharge instructions given. Handouts on Hiatal Hernia and Hemorrhoids. Resume previous medications. YOU HAD AN ENDOSCOPIC PROCEDURE TODAY AT [facility]:   Refer to the procedure report that was given to you for any specific questions about what was found during the examination.  If the procedure report does not answer your questions, please call your gastroenterologist to clarify.  If you requested that your care partner not be given the details of your procedure findings, then the procedure report has been included in a sealed envelope for you to review at your convenience later.  YOU SHOULD EXPECT: Some feelings of bloating in the abdomen. Passage of more gas than usual.  Walking can help get rid of the air that was put into your GI tract during the procedure and reduce the bloating. If you had a lower endoscopy (such as a colonoscopy or flexible sigmoidoscopy) you may notice spotting of blood in your stool or on the toilet paper. If you underwent a bowel prep for your procedure, you may not have a normal bowel movement  for a few days.  Please Note:  You might notice some irritation and congestion in your nose or some drainage.  This is from the oxygen used during your procedure.  There is no need for concern and it should clear up in a day or so.  SYMPTOMS TO REPORT IMMEDIATELY:  Following lower endoscopy (colonoscopy or flexible sigmoidoscopy):  Excessive amounts of blood in the stool  Significant tenderness or worsening of abdominal pains  Swelling of the abdomen that is new, acute  Fever of 100F or higher  Following upper endoscopy (EGD)  Vomiting of blood or coffee ground material  New chest pain or pain under the shoulder blades  Painful or persistently difficult swallowing  New shortness of breath  Fever of 100F or higher  Black, tarry-looking stools  For urgent or emergent issues, a gastroenterologist can be reached at any hour by calling [phone]. Do not use MyChart messaging for urgent concerns.    DIET:  We do recommend a small meal at first, but then you may proceed to your regular diet.  Drink plenty of fluids but you should avoid alcoholic beverages for 24 hours.  ACTIVITY:  You should plan to take it easy for the rest of today and you should NOT DRIVE or use heavy machinery until tomorrow (because of the sedation medicines used during the test).    FOLLOW UP: Our staff will call the number listed on your records 48-72 hours following your procedure to check on you  and address any questions or concerns that you may have regarding the information given to you following your procedure. If we do not reach you, we will leave a message.  We will attempt to reach you two times.  During this call, we will ask if you have developed any symptoms of COVID 19. If you develop any symptoms (ie: fever, flu-like symptoms, shortness of breath, cough etc.) before then, please call [phone].  If you test positive for Covid 19 in the 2 weeks post procedure, please call and report this  information to [place].    If any biopsies were taken you will be contacted by phone or by letter within the next 1-3 weeks.  Please call us at [phone] if you have not heard about the biopsies in 3 weeks.    SIGNATURES/CONFIDENTIALITY: You and/or your care partner have signed paperwork which will be entered into your electronic medical record.  These signatures attest to the fact that that the information above on your After Visit Summary has been reviewed and is understood.  Full responsibility of the confidentiality of this discharge information lies with you and/or your care-partner.

## 2020-11-13 NOTE — Op Note (Signed)
[facility] Patient Name: Trevin Gartrell Procedure Date: 11/13/2020 9:09 AM MRN: [id number] Endoscopist: Gatha Mayer , MD Age: 75 Referring MD:  Date of Birth: 05-12-1945 Gender: Male Account #: [id number] Procedure:                Colonoscopy Indications:              Iron deficiency anemia Medicines:                Propofol per Anesthesia, Monitored Anesthesia Care Procedure:                Pre-Anesthesia Assessment:                           - Prior to the procedure, a History and Physical                            was performed, and patient medications and                            allergies were reviewed. The patient's tolerance of                            previous anesthesia was also reviewed. The risks                            and benefits of the procedure and the sedation                            options and risks were discussed with the patient.                            All questions were answered, and informed consent                            was obtained. Prior Anticoagulants: The patient has                            taken no previous anticoagulant or antiplatelet                            agents. ASA Grade Assessment: III - A patient with                            severe systemic disease. After reviewing the risks                            and benefits, the patient was deemed in                            satisfactory condition to undergo the procedure.                           After obtaining informed consent, the colonoscope  was passed under direct vision. Throughout the                            procedure, the patient's blood pressure, pulse, and                            oxygen saturations were monitored continuously. The                            Olympus CF-HQ190L was introduced through the anus                            and advanced to the the cecum, identified by                            appendiceal  orifice and ileocecal valve. The                            colonoscopy was performed without difficulty. The                            patient tolerated the procedure well. The quality                            of the bowel preparation was good. The ileocecal                            valve, appendiceal orifice, and rectum were                            photographed. Scope In: 9:37:05 AM Scope Out: 9:46:37 AM Scope Withdrawal Time: 0 hours 7 minutes 4 seconds  Total Procedure Duration: 0 hours 9 minutes 32 seconds  Findings:                 The perianal and digital rectal examinations were                            normal. Pertinent negatives include normal prostate                            (size, shape, and consistency).                           External and internal hemorrhoids were found.                           The exam was otherwise without abnormality on                            direct and retroflexion views. Complications:            No immediate complications. Estimated Blood Loss:     Estimated blood loss: none. Impression:               - External and internal hemorrhoids.                           -  The examination was otherwise normal on direct                            and retroflexion views.                           - No specimens collected. Recommendation:           - Patient has a contact number available for                            emergencies. The signs and symptoms of potential                            delayed complications were discussed with the                            patient. Return to normal activities tomorrow.                            Written discharge instructions were provided to the                            patient.                           - Resume previous diet.                           - See the other procedure note for documentation of                            additional recommendations. CAMERON ULCERS FROM                             HIATAL HERNIA ARE CAUSE OF IRON DEFICIENCY ANEMIA                           - No repeat colonoscopy due to age and the absence                            of colonic polyps. Gatha Mayer, MD 11/13/2020 9:59:01 AM This report has been signed electronically.

## 2020-11-15 ENCOUNTER — Telehealth: Payer: Self-pay

## 2020-11-15 NOTE — Telephone Encounter (Signed)
  Follow up Call-  Call back number 11/13/2020 08/11/2018  Post procedure Call Back phone  # [phone] cell  Permission to leave phone message Yes Yes  Some recent data might be hidden     Patient questions:  Do you have a fever, pain , or abdominal swelling? No. Pain Score  0 *  Have you tolerated food without any problems? Yes.    Have you been able to return to your normal activities? Yes.    Do you have any questions about your discharge instructions: Diet   No. Medications  No. Follow up visit  No.  Do you have questions or concerns about your Care? No.  Actions: * If pain score is 4 or above: No action needed, pain <4. Have you developed a fever since your procedure? no  2.   Have you had an respiratory symptoms (SOB or cough) since your procedure? no  3.   Have you tested positive for COVID 19 since your procedure no  4.   Have you had any family members/close contacts diagnosed with the COVID 19 since your procedure?  no   If yes to any of these questions please route to Joylene John, RN and Joella Prince, RN
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
## 2020-11-20 ENCOUNTER — Ambulatory Visit (INDEPENDENT_AMBULATORY_CARE_PROVIDER_SITE_OTHER): Payer: Medicare HMO

## 2020-11-20 VITALS — Ht 71.0 in | Wt 215.0 lb

## 2020-11-20 DIAGNOSIS — Z0000 Encounter for general adult medical examination without abnormal findings: Secondary | ICD-10-CM

## 2020-11-20 NOTE — Patient Instructions (Signed)
William Norton , Thank you for taking time to complete your Medicare Wellness Visit. I appreciate your ongoing commitment to your health goals. Please review the following plan we discussed and let me know if I can assist you in the future.   Screening recommendations/referrals: Colonoscopy: No longer required Recommended yearly ophthalmology/optometry visit for glaucoma screening and checkup Recommended yearly dental visit for hygiene and checkup  Vaccinations: Influenza vaccine: Up to date Pneumococcal vaccine: Up to date Tdap vaccine: Up to date-Due 10/21/2026 Shingles vaccine: Completed vaccines   Covid-19: Up to date  Advanced directives: Information mailed today  Conditions/risks identified: See problem list  Next appointment: Follow up in one year for your annual wellness visit. 11/25/2021 @ 1:40  Preventive Care 65 Years and Older, Male Preventive care refers to lifestyle choices and visits with your health care provider that can promote health and wellness. What does preventive care include? A yearly physical exam. This is also called an annual well check. Dental exams once or twice a year. Routine eye exams. Ask your health care provider how often you should have your eyes checked. Personal lifestyle choices, including: Daily care of your teeth and gums. Regular physical activity. Eating a healthy diet. Avoiding tobacco and drug use. Limiting alcohol use. Practicing safe sex. Taking low doses of aspirin every day. Taking vitamin and mineral supplements as recommended by your health care provider. What happens during an annual well check? The services and screenings done by your health care provider during your annual well check will depend on your age, overall health, lifestyle risk factors, and family history of disease. Counseling  Your health care provider may ask you questions about your: Alcohol use. Tobacco use. Drug use. Emotional well-being. Home and relationship  well-being. Sexual activity. Eating habits. History of falls. Memory and ability to understand (cognition). Work and work Statistician. Screening  You may have the following tests or measurements: Height, weight, and BMI. Blood pressure. Lipid and cholesterol levels. These may be checked every 5 years, or more frequently if you are over 61 years old. Skin check. Lung cancer screening. You may have this screening every year starting at age 9 if you have a 30-pack-year history of smoking and currently smoke or have quit within the past 15 years. Fecal occult blood test (FOBT) of the stool. You may have this test every year starting at age 65. Flexible sigmoidoscopy or colonoscopy. You may have a sigmoidoscopy every 5 years or a colonoscopy every 10 years starting at age 50. Prostate cancer screening. Recommendations will vary depending on your family history and other risks. Hepatitis C blood test. Hepatitis B blood test. Sexually transmitted disease (STD) testing. Diabetes screening. This is done by checking your blood sugar (glucose) after you have not eaten for a while (fasting). You may have this done every 1-3 years. Abdominal aortic aneurysm (AAA) screening. You may need this if you are a current or former smoker. Osteoporosis. You may be screened starting at age 38 if you are at high risk. Talk with your health care provider about your test results, treatment options, and if necessary, the need for more tests. Vaccines  Your health care provider may recommend certain vaccines, such as: Influenza vaccine. This is recommended every year. Tetanus, diphtheria, and acellular pertussis (Tdap, Td) vaccine. You may need a Td booster every 10 years. Zoster vaccine. You may need this after age 44. Pneumococcal 13-valent conjugate (PCV13) vaccine. One dose is recommended after age 12. Pneumococcal polysaccharide (PPSV23) vaccine. One dose  is recommended after age 34. Talk to your health care  provider about which screenings and vaccines you need and how often you need them. This information is not intended to replace advice given to you by your health care provider. Make sure you discuss any questions you have with your health care provider. Document Released: 05/10/2015 Document Revised: 01/01/2016 Document Reviewed: 02/12/2015 Elsevier Interactive Patient Education  2017 [place] Prevention in the Home Falls can cause injuries. They can happen to people of all ages. There are many things you can do to make your home safe and to help prevent falls. What can I do on the outside of my home? Regularly fix the edges of walkways and driveways and fix any cracks. Remove anything that might make you trip as you walk through a door, such as a raised step or threshold. Trim any bushes or trees on the path to your home. Use bright outdoor lighting. Clear any walking paths of anything that might make someone trip, such as rocks or tools. Regularly check to see if handrails are loose or broken. Make sure that both sides of any steps have handrails. Any raised decks and porches should have guardrails on the edges. Have any leaves, snow, or ice cleared regularly. Use sand or salt on walking paths during winter. Clean up any spills in your garage right away. This includes oil or grease spills. What can I do in the bathroom? Use night lights. Install grab bars by the toilet and in the tub and shower. Do not use towel bars as grab bars. Use non-skid mats or decals in the tub or shower. If you need to sit down in the shower, use a plastic, non-slip stool. Keep the floor dry. Clean up any water that spills on the floor as soon as it happens. Remove soap buildup in the tub or shower regularly. Attach bath mats securely with double-sided non-slip rug tape. Do not have throw rugs and other things on the floor that can make you trip. What can I do in the bedroom? Use night lights. Make  sure that you have a light by your bed that is easy to reach. Do not use any sheets or blankets that are too big for your bed. They should not hang down onto the floor. Have a firm chair that has side arms. You can use this for support while you get dressed. Do not have throw rugs and other things on the floor that can make you trip. What can I do in the kitchen? Clean up any spills right away. Avoid walking on wet floors. Keep items that you use a lot in easy-to-reach places. If you need to reach something above you, use a strong step stool that has a grab bar. Keep electrical cords out of the way. Do not use floor polish or wax that makes floors slippery. If you must use wax, use non-skid floor wax. Do not have throw rugs and other things on the floor that can make you trip. What can I do with my stairs? Do not leave any items on the stairs. Make sure that there are handrails on both sides of the stairs and use them. Fix handrails that are broken or loose. Make sure that handrails are as long as the stairways. Check any carpeting to make sure that it is firmly attached to the stairs. Fix any carpet that is loose or worn. Avoid having throw rugs at the top or bottom of the stairs.  If you do have throw rugs, attach them to the floor with carpet tape. Make sure that you have a light switch at the top of the stairs and the bottom of the stairs. If you do not have them, ask someone to add them for you. What else can I do to help prevent falls? Wear shoes that: Do not have high heels. Have rubber bottoms. Are comfortable and fit you well. Are closed at the toe. Do not wear sandals. If you use a stepladder: Make sure that it is fully opened. Do not climb a closed stepladder. Make sure that both sides of the stepladder are locked into place. Ask someone to hold it for you, if possible. Clearly mark and make sure that you can see: Any grab bars or handrails. First and last steps. Where the  edge of each step is. Use tools that help you move around (mobility aids) if they are needed. These include: Canes. Walkers. Scooters. Crutches. Turn on the lights when you go into a dark area. Replace any light bulbs as soon as they burn out. Set up your furniture so you have a clear path. Avoid moving your furniture around. If any of your floors are uneven, fix them. If there are any pets around you, be aware of where they are. Review your medicines with your doctor. Some medicines can make you feel dizzy. This can increase your chance of falling. Ask your doctor what other things that you can do to help prevent falls. This information is not intended to replace advice given to you by your health care provider. Make sure you discuss any questions you have with your health care provider. Document Released: 02/07/2009 Document Revised: 09/19/2015 Document Reviewed: 05/18/2014 Elsevier Interactive Patient Education  2017 [employer].

## 2020-11-26 DIAGNOSIS — R69 Illness, unspecified: Secondary | ICD-10-CM | POA: Diagnosis not present

## 2020-11-27 ENCOUNTER — Encounter: Payer: Self-pay | Admitting: Internal Medicine

## 2020-12-06 ENCOUNTER — Telehealth: Payer: Self-pay | Admitting: Internal Medicine

## 2020-12-06 NOTE — Telephone Encounter (Signed)
[facility] called for the patient in regards of his atorvastatin (LIPITOR) 40 MG tablet  medicine. She states that the insurance will no longer accept the 90 day supply, so she is wondering if they can send a script for a 100 day supply instead. Please advise.   [address] [place], [place] [address]  Phone:  [phone]  Fax:  [phone]

## 2020-12-09 MED ORDER — ATORVASTATIN CALCIUM 40 MG PO TABS
40.0000 mg | ORAL_TABLET | Freq: Every day | ORAL | 1 refills | Status: DC
Start: 2020-12-09 — End: 2021-06-30

## 2020-12-09 NOTE — Telephone Encounter (Signed)
Rx sent 

## 2020-12-17 ENCOUNTER — Encounter: Payer: Self-pay | Admitting: *Deleted

## 2020-12-17 DIAGNOSIS — R69 Illness, unspecified: Secondary | ICD-10-CM | POA: Diagnosis not present

## 2020-12-18 ENCOUNTER — Other Ambulatory Visit: Payer: Self-pay | Admitting: *Deleted

## 2020-12-18 MED ORDER — GABAPENTIN 300 MG PO CAPS: 300.0000 mg | ORAL_CAPSULE | Freq: Two times a day (BID) | ORAL | 0 refills | Status: DC

## 2020-12-29 ENCOUNTER — Other Ambulatory Visit: Payer: Self-pay | Admitting: Internal Medicine

## 2021-01-06 ENCOUNTER — Encounter: Payer: Self-pay | Admitting: Internal Medicine

## 2021-01-17 DIAGNOSIS — R69 Illness, unspecified: Secondary | ICD-10-CM | POA: Diagnosis not present

## 2021-01-20 ENCOUNTER — Ambulatory Visit: Payer: Medicare HMO | Admitting: Internal Medicine

## 2021-01-23 ENCOUNTER — Other Ambulatory Visit: Payer: Self-pay

## 2021-01-23 ENCOUNTER — Ambulatory Visit (INDEPENDENT_AMBULATORY_CARE_PROVIDER_SITE_OTHER): Payer: Medicare HMO | Admitting: Internal Medicine

## 2021-01-23 ENCOUNTER — Encounter: Payer: Self-pay | Admitting: Internal Medicine

## 2021-01-23 VITALS — BP 122/80 | HR 82 | Temp 97.8°F | Resp 18 | Ht 71.0 in | Wt 206.1 lb

## 2021-01-23 DIAGNOSIS — E1159 Type 2 diabetes mellitus with other circulatory complications: Secondary | ICD-10-CM | POA: Diagnosis not present

## 2021-01-23 DIAGNOSIS — Z23 Encounter for immunization: Secondary | ICD-10-CM

## 2021-01-23 DIAGNOSIS — D509 Iron deficiency anemia, unspecified: Secondary | ICD-10-CM | POA: Diagnosis not present

## 2021-01-23 DIAGNOSIS — M1990 Unspecified osteoarthritis, unspecified site: Secondary | ICD-10-CM | POA: Diagnosis not present

## 2021-01-23 DIAGNOSIS — I10 Essential (primary) hypertension: Secondary | ICD-10-CM | POA: Diagnosis not present
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
## 2021-01-23 NOTE — Patient Instructions (Addendum)
Tylenol  500 mg OTC 2 tabs a day every 8 hours as needed for pain   Check the  blood pressure regularly  BP GOAL is between 110/65 and  135/85. If it is consistently higher or lower, let me know    GO TO THE LAB : Get the blood work     [place], [place] back for a physical exam in 4 months, fasting

## 2021-01-24 LAB — CBC WITH DIFFERENTIAL/PLATELET
Basophils Absolute: 0 10*3/uL (ref 0.0–0.1)
Basophils Relative: 0.7 % (ref 0.0–3.0)
Eosinophils Absolute: 0.3 10*3/uL (ref 0.0–0.7)
Eosinophils Relative: 4.4 % (ref 0.0–5.0)
HCT: 41.4 % (ref 39.0–52.0)
Hemoglobin: 14 g/dL (ref 13.0–17.0)
Lymphocytes Relative: 22.6 % (ref 12.0–46.0)
Lymphs Abs: 1.5 10*3/uL (ref 0.7–4.0)
MCHC: 33.9 g/dL (ref 30.0–36.0)
MCV: 88.1 fl (ref 78.0–100.0)
Monocytes Absolute: 0.7 10*3/uL (ref 0.1–1.0)
Monocytes Relative: 10.9 % (ref 3.0–12.0)
Neutro Abs: 4.1 10*3/uL (ref 1.4–7.7)
Neutrophils Relative %: 61.4 % (ref 43.0–77.0)
Platelets: 263 10*3/uL (ref 150.0–400.0)
RBC: 4.7 Mil/uL (ref 4.22–5.81)
RDW: 15.1 % (ref 11.5–15.5)
WBC: 6.7 10*3/uL (ref 4.0–10.5)

## 2021-01-24 LAB — HEMOGLOBIN A1C: Hgb A1c MFr Bld: 5.9 % (ref 4.6–6.5)

## 2021-01-24 LAB — BASIC METABOLIC PANEL
BUN: 19 mg/dL (ref 6–23)
CO2: 21 mEq/L (ref 19–32)
Calcium: 9.6 mg/dL (ref 8.4–10.5)
Chloride: 104 mEq/L (ref 96–112)
Creatinine, Ser: 0.86 mg/dL (ref 0.40–1.50)
GFR: 84.15 mL/min (ref >60.00)
Glucose, Bld: 62 mg/dL — ABNORMAL LOW (ref 70–99)
Potassium: 4 mEq/L (ref 3.5–5.1)
Sodium: 139 mEq/L (ref 135–145)

## 2021-01-24 LAB — IBC + FERRITIN
Ferritin: 129.7 ng/mL (ref 22.0–322.0)
Iron: 40 ug/dL — ABNORMAL LOW (ref 42–165)
Saturation Ratios: 11.4 % — ABNORMAL LOW (ref 20.0–50.0)
TIBC: 350 ug/dL (ref 250.0–450.0)
Transferrin: 250 mg/dL (ref 212.0–360.0)

## 2021-01-24 NOTE — Assessment & Plan Note (Signed)
DM: Continue metformin, Actos.  Ambulatory CBGs in the morning: Low 100s.  Check A1c HTN:BP is very good today, continue losartan, check BMP Iron deficiency anemia, recurrent  Celebrex discontinued at some point 01-2020 Chart reviewed, since the last visit he had the following: --Normal colonoscopy 10-2020. --EGD 10-2020: Short segment of Barrett's, Cameron ulcers, they are felt to be the cause of iron deficiency Currently on iron, Protonix.  Checking labs. DJD: See message from the patient 01/06/2021, he requested Celebrex, I recommended Tylenol instead due to his tendency to bleed.  He agreed. Depression, anxiety: See last visit, since then he reestablish with a psychiatrist and is back on Abilify and Lexapro and feeling well emotionally. RTC 4 months CPX

## 2021-01-27 ENCOUNTER — Encounter: Payer: Self-pay | Admitting: Internal Medicine

## 2021-01-28 DIAGNOSIS — G20 Parkinson's disease: Secondary | ICD-10-CM | POA: Diagnosis not present

## 2021-01-28 DIAGNOSIS — E1151 Type 2 diabetes mellitus with diabetic peripheral angiopathy without gangrene: Secondary | ICD-10-CM | POA: Diagnosis not present

## 2021-01-28 DIAGNOSIS — E1142 Type 2 diabetes mellitus with diabetic polyneuropathy: Secondary | ICD-10-CM | POA: Diagnosis not present

## 2021-01-28 DIAGNOSIS — R69 Illness, unspecified: Secondary | ICD-10-CM | POA: Diagnosis not present

## 2021-01-28 DIAGNOSIS — E785 Hyperlipidemia, unspecified: Secondary | ICD-10-CM | POA: Diagnosis not present

## 2021-01-28 DIAGNOSIS — G8194 Hemiplegia, unspecified affecting left nondominant side: Secondary | ICD-10-CM | POA: Diagnosis not present

## 2021-01-28 DIAGNOSIS — C61 Malignant neoplasm of prostate: Secondary | ICD-10-CM | POA: Diagnosis not present

## 2021-02-21 DIAGNOSIS — R69 Illness, unspecified: Secondary | ICD-10-CM | POA: Diagnosis not present

## 2021-02-21 DIAGNOSIS — F341 Dysthymic disorder: Secondary | ICD-10-CM | POA: Diagnosis not present

## 2021-03-09 ENCOUNTER — Other Ambulatory Visit: Payer: Self-pay | Admitting: Internal Medicine

## 2021-04-02 ENCOUNTER — Other Ambulatory Visit: Payer: Self-pay | Admitting: Internal Medicine

## 2021-04-15 ENCOUNTER — Ambulatory Visit: Payer: Medicare HMO | Admitting: Adult Health

## 2021-04-15 ENCOUNTER — Encounter: Payer: Self-pay | Admitting: Adult Health

## 2021-04-15 VITALS — BP 117/69 | HR 67 | Ht 71.0 in | Wt 211.0 lb

## 2021-04-15 DIAGNOSIS — G20 Parkinson's disease: Secondary | ICD-10-CM

## 2021-04-15 DIAGNOSIS — G459 Transient cerebral ischemic attack, unspecified: Secondary | ICD-10-CM | POA: Diagnosis not present

## 2021-04-15 MED ORDER — GABAPENTIN 300 MG PO CAPS: 300.0000 mg | ORAL_CAPSULE | Freq: Two times a day (BID) | ORAL | 3 refills | Status: DC
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
## 2021-04-15 NOTE — Patient Instructions (Signed)
No changes today.  Continue current treatment plan    Follow-up in 1 year or call earlier if needed     Thank you for coming to see [place] at [facility]. I hope we have been able to provide you high quality care today.  You may receive a patient satisfaction survey over the next few weeks. We would appreciate your feedback and comments so that we may continue to improve ourselves and the health of our patients.

## 2021-04-22 ENCOUNTER — Other Ambulatory Visit: Payer: Self-pay | Admitting: Internal Medicine

## 2021-04-29 DIAGNOSIS — C61 Malignant neoplasm of prostate: Secondary | ICD-10-CM | POA: Diagnosis not present

## 2021-04-29 LAB — PSA: PSA: 5.59

## 2021-05-06 ENCOUNTER — Other Ambulatory Visit: Payer: Self-pay | Admitting: Internal Medicine

## 2021-05-06 DIAGNOSIS — R350 Frequency of micturition: Secondary | ICD-10-CM | POA: Diagnosis not present

## 2021-05-06 DIAGNOSIS — R3915 Urgency of urination: Secondary | ICD-10-CM | POA: Diagnosis not present

## 2021-05-06 DIAGNOSIS — C61 Malignant neoplasm of prostate: Secondary | ICD-10-CM | POA: Diagnosis not present

## 2021-05-06 DIAGNOSIS — N401 Enlarged prostate with lower urinary tract symptoms: Secondary | ICD-10-CM | POA: Diagnosis not present

## 2021-05-07 ENCOUNTER — Encounter: Payer: Self-pay | Admitting: Internal Medicine

## 2021-05-28 ENCOUNTER — Ambulatory Visit (INDEPENDENT_AMBULATORY_CARE_PROVIDER_SITE_OTHER): Payer: Medicare HMO | Admitting: Internal Medicine

## 2021-05-28 ENCOUNTER — Encounter: Payer: Self-pay | Admitting: Internal Medicine

## 2021-05-28 VITALS — BP 138/88 | HR 83 | Temp 98.0°F | Resp 18 | Ht 71.0 in | Wt 221.2 lb

## 2021-05-28 DIAGNOSIS — Z0000 Encounter for general adult medical examination without abnormal findings: Secondary | ICD-10-CM

## 2021-05-28 DIAGNOSIS — M48062 Spinal stenosis, lumbar region with neurogenic claudication: Secondary | ICD-10-CM | POA: Diagnosis not present

## 2021-05-28 DIAGNOSIS — I10 Essential (primary) hypertension: Secondary | ICD-10-CM

## 2021-05-28 DIAGNOSIS — E1159 Type 2 diabetes mellitus with other circulatory complications: Secondary | ICD-10-CM | POA: Diagnosis not present

## 2021-05-28 DIAGNOSIS — Z122 Encounter for screening for malignant neoplasm of respiratory organs: Secondary | ICD-10-CM

## 2021-05-28 DIAGNOSIS — E785 Hyperlipidemia, unspecified: Secondary | ICD-10-CM

## 2021-05-28 DIAGNOSIS — I779 Disorder of arteries and arterioles, unspecified: Secondary | ICD-10-CM | POA: Diagnosis not present

## 2021-05-28 DIAGNOSIS — Z0001 Encounter for general adult medical examination with abnormal findings: Secondary | ICD-10-CM

## 2021-05-28 LAB — COMPREHENSIVE METABOLIC PANEL
ALT: 14 U/L (ref 0–53)
AST: 16 U/L (ref 0–37)
Albumin: 4.5 g/dL (ref 3.5–5.2)
Alkaline Phosphatase: 54 U/L (ref 39–117)
BUN: 27 mg/dL — ABNORMAL HIGH (ref 6–23)
CO2: 23 mEq/L (ref 19–32)
Calcium: 9.9 mg/dL (ref 8.4–10.5)
Chloride: 102 mEq/L (ref 96–112)
Creatinine, Ser: 0.87 mg/dL (ref 0.40–1.50)
GFR: 83.65 mL/min (ref >60.00)
Glucose, Bld: 125 mg/dL — ABNORMAL HIGH (ref 70–99)
Potassium: 4.5 mEq/L (ref 3.5–5.1)
Sodium: 135 mEq/L (ref 135–145)
Total Bilirubin: 1 mg/dL (ref 0.2–1.2)
Total Protein: 7.7 g/dL (ref 6.0–8.3)

## 2021-05-28 LAB — CBC WITH DIFFERENTIAL/PLATELET
Basophils Absolute: 0 10*3/uL (ref 0.0–0.1)
Basophils Relative: 0.1 % (ref 0.0–3.0)
Eosinophils Absolute: 0 10*3/uL (ref 0.0–0.7)
Eosinophils Relative: 0 % (ref 0.0–5.0)
HCT: 40.3 % (ref 39.0–52.0)
Hemoglobin: 13.5 g/dL (ref 13.0–17.0)
Lymphocytes Relative: 9.2 % — ABNORMAL LOW (ref 12.0–46.0)
Lymphs Abs: 0.7 10*3/uL (ref 0.7–4.0)
MCHC: 33.5 g/dL (ref 30.0–36.0)
MCV: 89 fl (ref 78.0–100.0)
Monocytes Absolute: 0.1 10*3/uL (ref 0.1–1.0)
Monocytes Relative: 1.4 % — ABNORMAL LOW (ref 3.0–12.0)
Neutro Abs: 7 10*3/uL (ref 1.4–7.7)
Neutrophils Relative %: 89.3 % — ABNORMAL HIGH (ref 43.0–77.0)
Platelets: 276 10*3/uL (ref 150.0–400.0)
RBC: 4.52 Mil/uL (ref 4.22–5.81)
RDW: 14.1 % (ref 11.5–15.5)
WBC: 7.9 10*3/uL (ref 4.0–10.5)

## 2021-05-28 LAB — LIPID PANEL
Cholesterol: 170 mg/dL (ref 0–200)
HDL: 84.2 mg/dL (ref >39.00)
LDL Cholesterol: 71 mg/dL (ref 0–99)
NonHDL: 85.83
Total CHOL/HDL Ratio: 2
Triglycerides: 72 mg/dL (ref 0.0–149.0)
VLDL: 14.4 mg/dL (ref 0.0–40.0)

## 2021-05-28 LAB — HEMOGLOBIN A1C: Hgb A1c MFr Bld: 6.1 % (ref 4.6–6.5)

## 2021-05-28 NOTE — Assessment & Plan Note (Signed)
Here for CPX DM: On metformin and Actos, CBGs typically under 100.  Check A1c HTN: Reports normal ambulatory BPs.  No change for now, continue losartan. High cholesterol: On Lipitor, labs. CAD: Asymptomatic. TIA, carotid artery disease per  MRA; d/w neurology via message, will proceed with a carotid ultrasound and continue CV RF control. GERD: On PPIs, no symptoms. OSA: CPAP intolerant Spinal stenosis: Patient's observation is that the Decadron (rx for a dental procedure) definitely help with spinal stenosis pain, explained that steroids are not a good long-term option, needs to stick with Tylenol.  If needed a stronger medication recommend to discuss with pain management. RTC 5 to 6 months

## 2021-05-28 NOTE — Patient Instructions (Addendum)
Check the  blood pressure regularly BP GOAL is between 110/65 and  135/85. If it is consistently higher or lower, let me know  Continue checking your blood sugars  Please read the information about advance care planning  GO TO THE LAB : Get the blood work     [place], [place] back for a checkup in 5 to 6  months
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
## 2021-05-28 NOTE — Assessment & Plan Note (Signed)
--  Td 2018  -PNM 23: 2012, 2022 -prevnar 13: 2015 -s/p shingrix - s/p covid vaccines  > UTD  -had a Flu shot   --CCS: cscope 2016, Cscope wnl 2022  -- h/o Prostate cancer  see urology --Lung cancer screening: Interested in continue screening, referred to pulmonary --diet, exercise: Discussed --labs: CMP, FLP, CBC, A1c -- ACP information provided

## 2021-05-30 ENCOUNTER — Ambulatory Visit (HOSPITAL_COMMUNITY)
Admission: RE | Admit: 2021-05-30 | Discharge: 2021-05-30 | Disposition: A | Discharge status: Home / Self Care | Payer: Medicare HMO | Source: Ambulatory Visit | Attending: Internal Medicine | Admitting: Internal Medicine

## 2021-05-30 ENCOUNTER — Other Ambulatory Visit: Payer: Self-pay

## 2021-05-30 DIAGNOSIS — I779 Disorder of arteries and arterioles, unspecified: Secondary | ICD-10-CM | POA: Diagnosis not present

## 2021-06-20 DIAGNOSIS — R69 Illness, unspecified: Secondary | ICD-10-CM | POA: Diagnosis not present

## 2021-06-20 DIAGNOSIS — F341 Dysthymic disorder: Secondary | ICD-10-CM | POA: Diagnosis not present

## 2021-06-23 ENCOUNTER — Other Ambulatory Visit: Payer: Self-pay

## 2021-06-23 MED ORDER — GABAPENTIN 300 MG PO CAPS: 300.0000 mg | ORAL_CAPSULE | Freq: Two times a day (BID) | ORAL | 3 refills | Status: DC

## 2021-06-27 ENCOUNTER — Other Ambulatory Visit: Payer: Self-pay | Admitting: Internal Medicine

## 2021-07-31 ENCOUNTER — Encounter: Payer: Self-pay | Admitting: Internal Medicine

## 2021-08-20 ENCOUNTER — Ambulatory Visit (HOSPITAL_COMMUNITY): Payer: Medicare HMO | Attending: Cardiology

## 2021-08-20 DIAGNOSIS — G4730 Sleep apnea, unspecified: Secondary | ICD-10-CM | POA: Insufficient documentation

## 2021-08-20 DIAGNOSIS — I10 Essential (primary) hypertension: Secondary | ICD-10-CM | POA: Diagnosis not present

## 2021-08-20 DIAGNOSIS — E119 Type 2 diabetes mellitus without complications: Secondary | ICD-10-CM | POA: Insufficient documentation

## 2021-08-20 DIAGNOSIS — I77810 Thoracic aortic ectasia: Secondary | ICD-10-CM | POA: Diagnosis not present

## 2021-08-20 DIAGNOSIS — Z87891 Personal history of nicotine dependence: Secondary | ICD-10-CM | POA: Diagnosis not present

## 2021-08-20 DIAGNOSIS — R55 Syncope and collapse: Secondary | ICD-10-CM | POA: Insufficient documentation

## 2021-08-20 LAB — ECHOCARDIOGRAM COMPLETE: Area-P 1/2: 2.87 cm2

## 2021-08-21 ENCOUNTER — Telehealth: Payer: Self-pay

## 2021-08-21 DIAGNOSIS — I77810 Thoracic aortic ectasia: Secondary | ICD-10-CM

## 2021-08-21 NOTE — Telephone Encounter (Signed)
Order placed for CTA of chest/aorta in one year. ?

## 2021-08-21 NOTE — Telephone Encounter (Signed)
-----   Message from Liliane Shi, [place] sent at 08/20/2021  4:57 PM EDT ----- ?EF normal.  Ascending aorta mildly dilated and stable since last echocardiogram in 2022. ?PLAN:  ?-Continue current medications/treatment plan and follow up as scheduled.  ?-Arrange chest/aorta CTA in 1 year (April 2024) -Dx: Dilated ascending aorta ?-Send copy to PCP ?Richardson Dopp, PA-C    ?08/20/2021 4:53 PM   ?

## 2021-09-02 DIAGNOSIS — F341 Dysthymic disorder: Secondary | ICD-10-CM | POA: Diagnosis not present

## 2021-09-02 DIAGNOSIS — R69 Illness, unspecified: Secondary | ICD-10-CM | POA: Diagnosis not present

## 2021-09-08 ENCOUNTER — Other Ambulatory Visit: Payer: Self-pay | Admitting: Internal Medicine

## 2021-09-29 DIAGNOSIS — H524 Presbyopia: Secondary | ICD-10-CM | POA: Diagnosis not present

## 2021-09-29 DIAGNOSIS — Z0100 Encounter for examination of eyes and vision without abnormal findings: Secondary | ICD-10-CM | POA: Diagnosis not present

## 2021-09-30 DIAGNOSIS — R69 Illness, unspecified: Secondary | ICD-10-CM | POA: Diagnosis not present

## 2021-09-30 DIAGNOSIS — F341 Dysthymic disorder: Secondary | ICD-10-CM | POA: Diagnosis not present

## 2021-10-17 ENCOUNTER — Other Ambulatory Visit: Payer: Self-pay | Admitting: Internal Medicine

## 2021-10-21 DIAGNOSIS — F341 Dysthymic disorder: Secondary | ICD-10-CM | POA: Diagnosis not present

## 2021-10-21 DIAGNOSIS — R69 Illness, unspecified: Secondary | ICD-10-CM | POA: Diagnosis not present

## 2021-11-07 ENCOUNTER — Other Ambulatory Visit: Payer: Self-pay | Admitting: *Deleted

## 2021-11-07 DIAGNOSIS — I77810 Thoracic aortic ectasia: Secondary | ICD-10-CM

## 2021-11-07 DIAGNOSIS — Z01812 Encounter for preprocedural laboratory examination: Secondary | ICD-10-CM

## 2021-11-10 ENCOUNTER — Ambulatory Visit (INDEPENDENT_AMBULATORY_CARE_PROVIDER_SITE_OTHER): Payer: Medicare HMO | Admitting: Internal Medicine

## 2021-11-10 ENCOUNTER — Encounter: Payer: Self-pay | Admitting: Internal Medicine

## 2021-11-10 VITALS — BP 122/68 | HR 58 | Temp 98.5°F | Resp 18 | Ht 71.0 in | Wt 227.5 lb

## 2021-11-10 DIAGNOSIS — H9193 Unspecified hearing loss, bilateral: Secondary | ICD-10-CM

## 2021-11-10 DIAGNOSIS — I10 Essential (primary) hypertension: Secondary | ICD-10-CM | POA: Diagnosis not present

## 2021-11-10 DIAGNOSIS — E1159 Type 2 diabetes mellitus with other circulatory complications: Secondary | ICD-10-CM

## 2021-11-10 LAB — HEMOGLOBIN A1C: Hgb A1c MFr Bld: 6.5 % (ref 4.6–6.5)

## 2021-11-10 LAB — BASIC METABOLIC PANEL
BUN: 24 mg/dL — ABNORMAL HIGH (ref 6–23)
CO2: 24 mEq/L (ref 19–32)
Calcium: 9.5 mg/dL (ref 8.4–10.5)
Chloride: 102 mEq/L (ref 96–112)
Creatinine, Ser: 0.91 mg/dL (ref 0.40–1.50)
GFR: 81.45 mL/min (ref >60.00)
Glucose, Bld: 98 mg/dL (ref 70–99)
Potassium: 4.9 mEq/L (ref 3.5–5.1)
Sodium: 137 mEq/L (ref 135–145)
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
## 2021-11-10 NOTE — Patient Instructions (Addendum)
Please consider a COVID-vaccine booster.  Get a flu shot this fall  Per our records you are due for your diabetic eye exam. Please contact your eye doctor to schedule an appointment. Please have them send copies of your office visit notes to [place]. Our fax number is [phone]. If you need a referral to an eye doctor please let us know.    Check the  blood pressure regularly BP GOAL is between 110/65 and  135/85. If it is consistently higher or lower, let me know     GO TO THE LAB : Get the blood work     [place], [place] back for   a checkup in 4 to 5 months   Diabetes Mellitus and [place] care is an important part of your health, especially when you have diabetes. Diabetes may cause you to have problems because of poor blood flow (circulation) to your feet and legs, which can cause your skin to: Become thinner and drier. Break more easily. Heal more slowly. Peel and crack. You may also have nerve damage (neuropathy) in your legs and feet, causing decreased feeling in them. This means that you may not notice minor injuries to your feet that could lead to more serious problems. Noticing and addressing any potential problems early is the best way to prevent future foot problems. How to care for your feet Foot hygiene  Wash your feet daily with warm water and mild soap. Do not use hot water. Then, pat your feet and the areas between your toes until they are completely dry. Do not soak your feet as this can dry your skin. Trim your toenails straight across. Do not dig under them or around the cuticle. File the edges of your nails with an emery board or nail file. Apply a moisturizing lotion or petroleum jelly to the skin on your feet and to dry, brittle toenails. Use lotion that does not contain alcohol and is unscented. Do not apply lotion between your toes. Shoes and socks Wear clean socks or stockings every day. Make sure they are  not too tight. Do not wear knee-high stockings since they may decrease blood flow to your legs. Wear shoes that fit properly and have enough cushioning. Always look in your shoes before you put them on to be sure there are no objects inside. To break in new shoes, wear them for just a few hours a day. This prevents injuries on your feet. Wounds, scrapes, corns, and calluses  Check your feet daily for blisters, cuts, bruises, sores, and redness. If you cannot see the bottom of your feet, use a mirror or ask someone for help. Do not cut corns or calluses or try to remove them with medicine. If you find a minor scrape, cut, or break in the skin on your feet, keep it and the skin around it clean and dry. You may clean these areas with mild soap and water. Do not clean the area with peroxide, alcohol, or iodine. If you have a wound, scrape, corn, or callus on your foot, look at it several times a day to make sure it is healing and not infected. Check for: Redness, swelling, or pain. Fluid or blood. Warmth. Pus or a bad smell. General tips Do not cross your legs. This may decrease blood flow to your feet. Do not use heating pads or hot water bottles on your feet. They may burn your skin. If you have  lost feeling in your feet or legs, you may not know this is happening until it is too late. Protect your feet from hot and cold by wearing shoes, such as at the beach or on hot pavement. Schedule a complete foot exam at least once a year (annually) or more often if you have foot problems. Report any cuts, sores, or bruises to your health care provider immediately. Where to find more information American Diabetes Association: www.diabetes.org Association of Diabetes Care & Education Specialists: www.diabeteseducator.org Contact a health care provider if: You have a medical condition that increases your risk of infection and you have any cuts, sores, or bruises on your feet. You have an injury that is not  healing. You have redness on your legs or feet. You feel burning or tingling in your legs or feet. You have pain or cramps in your legs and feet. Your legs or feet are numb. Your feet always feel cold. You have pain around any toenails. Get help right away if: You have a wound, scrape, corn, or callus on your foot and: You have pain, swelling, or redness that gets worse. You have fluid or blood coming from the wound, scrape, corn, or callus. Your wound, scrape, corn, or callus feels warm to the touch. You have pus or a bad smell coming from the wound, scrape, corn, or callus. You have a fever. You have a red line going up your leg. Summary Check your feet every day for blisters, cuts, bruises, sores, and redness. Apply a moisturizing lotion or petroleum jelly to the skin on your feet and to dry, brittle toenails. Wear shoes that fit properly and have enough cushioning. If you have foot problems, report any cuts, sores, or bruises to your health care provider immediately. Schedule a complete foot exam at least once a year (annually) or more often if you have foot problems. This information is not intended to replace advice given to you by your health care provider. Make sure you discuss any questions you have with your health care provider. Document Revised: 11/02/2019 Document Reviewed: 11/02/2019 Elsevier Patient Education  [place].

## 2021-11-11 NOTE — Assessment & Plan Note (Signed)
DM: On metformin, pioglitazone.  Ambulatory CBGs are very good on average 130.  Check A1c. Neuropathy: Has neuropathy on exam, feet care discussed. HTN: BP is very good, continue losartan, last BMP satisfactory. Depression: His psych provider switch from Lexapro to Zoloft (per patient), he is still on Abilify. Spinal stenosis: Per Dr. Nelva Bush [facility]: 100% loss on the  L, 60% loss on the R per pt.  Has hearing aids, would like to explore a cochlear implant, refer to Dr. Elwyn Reach. RTC 4 to 4-month

## 2021-11-18 DIAGNOSIS — E669 Obesity, unspecified: Secondary | ICD-10-CM | POA: Diagnosis not present

## 2021-11-18 DIAGNOSIS — M4800 Spinal stenosis, site unspecified: Secondary | ICD-10-CM | POA: Diagnosis not present

## 2021-11-18 DIAGNOSIS — I2510 Atherosclerotic heart disease of native coronary artery without angina pectoris: Secondary | ICD-10-CM | POA: Diagnosis not present

## 2021-11-18 DIAGNOSIS — N400 Enlarged prostate without lower urinary tract symptoms: Secondary | ICD-10-CM | POA: Diagnosis not present

## 2021-11-18 DIAGNOSIS — I10 Essential (primary) hypertension: Secondary | ICD-10-CM | POA: Diagnosis not present

## 2021-11-18 DIAGNOSIS — E1142 Type 2 diabetes mellitus with diabetic polyneuropathy: Secondary | ICD-10-CM | POA: Diagnosis not present

## 2021-11-18 DIAGNOSIS — E785 Hyperlipidemia, unspecified: Secondary | ICD-10-CM | POA: Diagnosis not present

## 2021-11-18 DIAGNOSIS — N529 Male erectile dysfunction, unspecified: Secondary | ICD-10-CM | POA: Diagnosis not present

## 2021-11-18 DIAGNOSIS — R69 Illness, unspecified: Secondary | ICD-10-CM | POA: Diagnosis not present

## 2021-11-18 DIAGNOSIS — M1990 Unspecified osteoarthritis, unspecified site: Secondary | ICD-10-CM | POA: Diagnosis not present

## 2021-11-18 DIAGNOSIS — K219 Gastro-esophageal reflux disease without esophagitis: Secondary | ICD-10-CM | POA: Diagnosis not present

## 2021-11-18 DIAGNOSIS — G20 Parkinson's disease: Secondary | ICD-10-CM | POA: Diagnosis not present

## 2021-11-21 ENCOUNTER — Other Ambulatory Visit: Payer: Self-pay | Admitting: Internal Medicine

## 2021-11-23 ENCOUNTER — Other Ambulatory Visit: Payer: Self-pay | Admitting: Internal Medicine

## 2021-11-25 ENCOUNTER — Ambulatory Visit (INDEPENDENT_AMBULATORY_CARE_PROVIDER_SITE_OTHER): Payer: Medicare HMO

## 2021-11-25 VITALS — BP 115/73 | HR 67 | Temp 98.9°F | Resp 16 | Ht 71.0 in | Wt 226.4 lb

## 2021-11-25 DIAGNOSIS — Z0000 Encounter for general adult medical examination without abnormal findings: Secondary | ICD-10-CM | POA: Diagnosis not present
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
## 2021-11-25 NOTE — Patient Instructions (Signed)
William Norton , Thank you for taking time to come for your Medicare Wellness Visit. I appreciate your ongoing commitment to your health goals. Please review the following plan we discussed and let me know if I can assist you in the future.   Screening recommendations/referrals: Colonoscopy: no longer needed Recommended yearly ophthalmology/optometry visit for glaucoma screening and checkup Recommended yearly dental visit for hygiene and checkup  Vaccinations: Influenza vaccine: up to date Pneumococcal vaccine: up to date Tdap vaccine: up to date Shingles vaccine: up to date   Covid-19: completed  Advanced directives: no  Conditions/risks identified: see problem list   Next appointment: Follow up in one year for your annual wellness visit.   Preventive Care 77 Years and Older, Male Preventive care refers to lifestyle choices and visits with your health care provider that can promote health and wellness. What does preventive care include? A yearly physical exam. This is also called an annual well check. Dental exams once or twice a year. Routine eye exams. Ask your health care provider how often you should have your eyes checked. Personal lifestyle choices, including: Daily care of your teeth and gums. Regular physical activity. Eating a healthy diet. Avoiding tobacco and drug use. Limiting alcohol use. Practicing safe sex. Taking low doses of aspirin every day. Taking vitamin and mineral supplements as recommended by your health care provider. What happens during an annual well check? The services and screenings done by your health care provider during your annual well check will depend on your age, overall health, lifestyle risk factors, and family history of disease. Counseling  Your health care provider may ask you questions about your: Alcohol use. Tobacco use. Drug use. Emotional well-being. Home and relationship well-being. Sexual activity. Eating habits. History of  falls. Memory and ability to understand (cognition). Work and work Statistician. Screening  You may have the following tests or measurements: Height, weight, and BMI. Blood pressure. Lipid and cholesterol levels. These may be checked every 5 years, or more frequently if you are over 77 years old. Skin check. Lung cancer screening. You may have this screening every year starting at age 77 if you have a 30-pack-year history of smoking and currently smoke or have quit within the past 15 years. Fecal occult blood test (FOBT) of the stool. You may have this test every year starting at age 77. Flexible sigmoidoscopy or colonoscopy. You may have a sigmoidoscopy every 5 years or a colonoscopy every 10 years starting at age 77. Prostate cancer screening. Recommendations will vary depending on your family history and other risks. Hepatitis C blood test. Hepatitis B blood test. Sexually transmitted disease (STD) testing. Diabetes screening. This is done by checking your blood sugar (glucose) after you have not eaten for a while (fasting). You may have this done every 1-3 years. Abdominal aortic aneurysm (AAA) screening. You may need this if you are a current or former smoker. Osteoporosis. You may be screened starting at age 77 if you are at high risk. Talk with your health care provider about your test results, treatment options, and if necessary, the need for more tests. Vaccines  Your health care provider may recommend certain vaccines, such as: Influenza vaccine. This is recommended every year. Tetanus, diphtheria, and acellular pertussis (Tdap, Td) vaccine. You may need a Td booster every 10 years. Zoster vaccine. You may need this after age 77. Pneumococcal 13-valent conjugate (PCV13) vaccine. One dose is recommended after age 77. Pneumococcal polysaccharide (PPSV23) vaccine. One dose is recommended after age  77. Talk to your health care provider about which screenings and vaccines you need and  how often you need them. This information is not intended to replace advice given to you by your health care provider. Make sure you discuss any questions you have with your health care provider. Document Released: 05/10/2015 Document Revised: 01/01/2016 Document Reviewed: 02/12/2015 Elsevier Interactive Patient Education  2017 [place] Prevention in the Home Falls can cause injuries. They can happen to people of all ages. There are many things you can do to make your home safe and to help prevent falls. What can I do on the outside of my home? Regularly fix the edges of walkways and driveways and fix any cracks. Remove anything that might make you trip as you walk through a door, such as a raised step or threshold. Trim any bushes or trees on the path to your home. Use bright outdoor lighting. Clear any walking paths of anything that might make someone trip, such as rocks or tools. Regularly check to see if handrails are loose or broken. Make sure that both sides of any steps have handrails. Any raised decks and porches should have guardrails on the edges. Have any leaves, snow, or ice cleared regularly. Use sand or salt on walking paths during winter. Clean up any spills in your garage right away. This includes oil or grease spills. What can I do in the bathroom? Use night lights. Install grab bars by the toilet and in the tub and shower. Do not use towel bars as grab bars. Use non-skid mats or decals in the tub or shower. If you need to sit down in the shower, use a plastic, non-slip stool. Keep the floor dry. Clean up any water that spills on the floor as soon as it happens. Remove soap buildup in the tub or shower regularly. Attach bath mats securely with double-sided non-slip rug tape. Do not have throw rugs and other things on the floor that can make you trip. What can I do in the bedroom? Use night lights. Make sure that you have a light by your bed that is easy to  reach. Do not use any sheets or blankets that are too big for your bed. They should not hang down onto the floor. Have a firm chair that has side arms. You can use this for support while you get dressed. Do not have throw rugs and other things on the floor that can make you trip. What can I do in the kitchen? Clean up any spills right away. Avoid walking on wet floors. Keep items that you use a lot in easy-to-reach places. If you need to reach something above you, use a strong step stool that has a grab bar. Keep electrical cords out of the way. Do not use floor polish or wax that makes floors slippery. If you must use wax, use non-skid floor wax. Do not have throw rugs and other things on the floor that can make you trip. What can I do with my stairs? Do not leave any items on the stairs. Make sure that there are handrails on both sides of the stairs and use them. Fix handrails that are broken or loose. Make sure that handrails are as long as the stairways. Check any carpeting to make sure that it is firmly attached to the stairs. Fix any carpet that is loose or worn. Avoid having throw rugs at the top or bottom of the stairs. If you do have  throw rugs, attach them to the floor with carpet tape. Make sure that you have a light switch at the top of the stairs and the bottom of the stairs. If you do not have them, ask someone to add them for you. What else can I do to help prevent falls? Wear shoes that: Do not have high heels. Have rubber bottoms. Are comfortable and fit you well. Are closed at the toe. Do not wear sandals. If you use a stepladder: Make sure that it is fully opened. Do not climb a closed stepladder. Make sure that both sides of the stepladder are locked into place. Ask someone to hold it for you, if possible. Clearly mark and make sure that you can see: Any grab bars or handrails. First and last steps. Where the edge of each step is. Use tools that help you move  around (mobility aids) if they are needed. These include: Canes. Walkers. Scooters. Crutches. Turn on the lights when you go into a dark area. Replace any light bulbs as soon as they burn out. Set up your furniture so you have a clear path. Avoid moving your furniture around. If any of your floors are uneven, fix them. If there are any pets around you, be aware of where they are. Review your medicines with your doctor. Some medicines can make you feel dizzy. This can increase your chance of falling. Ask your doctor what other things that you can do to help prevent falls. This information is not intended to replace advice given to you by your health care provider. Make sure you discuss any questions you have with your health care provider. Document Released: 02/07/2009 Document Revised: 09/19/2015 Document Reviewed: 05/18/2014 Elsevier Interactive Patient Education  2017 [employer].

## 2021-12-02 DIAGNOSIS — F341 Dysthymic disorder: Secondary | ICD-10-CM | POA: Diagnosis not present

## 2021-12-02 DIAGNOSIS — R69 Illness, unspecified: Secondary | ICD-10-CM | POA: Diagnosis not present

## 2021-12-06 ENCOUNTER — Other Ambulatory Visit: Payer: Self-pay | Admitting: Internal Medicine

## 2021-12-16 ENCOUNTER — Encounter: Payer: Self-pay | Admitting: Internal Medicine

## 2021-12-30 DIAGNOSIS — F341 Dysthymic disorder: Secondary | ICD-10-CM | POA: Diagnosis not present

## 2021-12-30 DIAGNOSIS — R69 Illness, unspecified: Secondary | ICD-10-CM | POA: Diagnosis not present

## 2022-01-01 ENCOUNTER — Other Ambulatory Visit: Payer: Self-pay | Admitting: *Deleted

## 2022-01-01 MED ORDER — CARBIDOPA-LEVODOPA 25-100 MG PO TABS: 1.0000 | ORAL_TABLET | Freq: Three times a day (TID) | ORAL | 3 refills | Status: DC

## 2022-01-14 ENCOUNTER — Emergency Department (HOSPITAL_BASED_OUTPATIENT_CLINIC_OR_DEPARTMENT_OTHER): Payer: Medicare HMO

## 2022-01-14 ENCOUNTER — Emergency Department (HOSPITAL_BASED_OUTPATIENT_CLINIC_OR_DEPARTMENT_OTHER)
Admission: EM | Admit: 2022-01-14 | Discharge: 2022-01-14 | Disposition: A | Discharge status: Home / Self Care | Payer: Medicare HMO | Attending: Emergency Medicine | Admitting: Emergency Medicine

## 2022-01-14 ENCOUNTER — Encounter (HOSPITAL_BASED_OUTPATIENT_CLINIC_OR_DEPARTMENT_OTHER): Payer: Self-pay

## 2022-01-14 ENCOUNTER — Other Ambulatory Visit: Payer: Self-pay

## 2022-01-14 DIAGNOSIS — Z79899 Other long term (current) drug therapy: Secondary | ICD-10-CM | POA: Insufficient documentation

## 2022-01-14 DIAGNOSIS — M25532 Pain in left wrist: Secondary | ICD-10-CM | POA: Insufficient documentation

## 2022-01-14 DIAGNOSIS — S62002A Unspecified fracture of navicular [scaphoid] bone of left wrist, initial encounter for closed fracture: Secondary | ICD-10-CM | POA: Diagnosis not present

## 2022-01-14 DIAGNOSIS — I2510 Atherosclerotic heart disease of native coronary artery without angina pectoris: Secondary | ICD-10-CM | POA: Insufficient documentation

## 2022-01-14 DIAGNOSIS — I10 Essential (primary) hypertension: Secondary | ICD-10-CM | POA: Insufficient documentation

## 2022-01-14 DIAGNOSIS — Z7982 Long term (current) use of aspirin: Secondary | ICD-10-CM | POA: Insufficient documentation

## 2022-01-14 DIAGNOSIS — I6782 Cerebral ischemia: Secondary | ICD-10-CM | POA: Diagnosis not present

## 2022-01-14 DIAGNOSIS — S60212A Contusion of left wrist, initial encounter: Secondary | ICD-10-CM | POA: Diagnosis not present

## 2022-01-14 DIAGNOSIS — M79645 Pain in left finger(s): Secondary | ICD-10-CM | POA: Insufficient documentation

## 2022-01-14 DIAGNOSIS — I6381 Other cerebral infarction due to occlusion or stenosis of small artery: Secondary | ICD-10-CM | POA: Diagnosis not present

## 2022-01-14 DIAGNOSIS — M47812 Spondylosis without myelopathy or radiculopathy, cervical region: Secondary | ICD-10-CM | POA: Insufficient documentation

## 2022-01-14 DIAGNOSIS — Z7984 Long term (current) use of oral hypoglycemic drugs: Secondary | ICD-10-CM | POA: Insufficient documentation

## 2022-01-14 DIAGNOSIS — M4312 Spondylolisthesis, cervical region: Secondary | ICD-10-CM | POA: Insufficient documentation

## 2022-01-14 DIAGNOSIS — E119 Type 2 diabetes mellitus without complications: Secondary | ICD-10-CM | POA: Insufficient documentation

## 2022-01-14 DIAGNOSIS — M79643 Pain in unspecified hand: Secondary | ICD-10-CM

## 2022-01-14 DIAGNOSIS — Z041 Encounter for examination and observation following transport accident: Secondary | ICD-10-CM | POA: Diagnosis not present

## 2022-01-14 DIAGNOSIS — Z8546 Personal history of malignant neoplasm of prostate: Secondary | ICD-10-CM | POA: Insufficient documentation

## 2022-01-14 DIAGNOSIS — S5012XA Contusion of left forearm, initial encounter: Secondary | ICD-10-CM | POA: Diagnosis not present

## 2022-01-14 DIAGNOSIS — S60222A Contusion of left hand, initial encounter: Secondary | ICD-10-CM | POA: Diagnosis not present
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
## 2022-01-14 NOTE — ED Notes (Signed)
Patient transported to CT 
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
## 2022-01-16 ENCOUNTER — Other Ambulatory Visit: Payer: Self-pay | Admitting: Internal Medicine

## 2022-01-23 ENCOUNTER — Emergency Department (HOSPITAL_BASED_OUTPATIENT_CLINIC_OR_DEPARTMENT_OTHER)
Admission: EM | Admit: 2022-01-23 | Discharge: 2022-01-23 | Disposition: A | Discharge status: Home / Self Care | Payer: Medicare HMO | Attending: Emergency Medicine | Admitting: Emergency Medicine

## 2022-01-23 ENCOUNTER — Telehealth: Payer: Self-pay | Admitting: *Deleted

## 2022-01-23 ENCOUNTER — Other Ambulatory Visit: Payer: Self-pay

## 2022-01-23 ENCOUNTER — Encounter (HOSPITAL_BASED_OUTPATIENT_CLINIC_OR_DEPARTMENT_OTHER): Payer: Self-pay

## 2022-01-23 DIAGNOSIS — Z7982 Long term (current) use of aspirin: Secondary | ICD-10-CM | POA: Insufficient documentation

## 2022-01-23 DIAGNOSIS — L03213 Periorbital cellulitis: Secondary | ICD-10-CM | POA: Insufficient documentation

## 2022-01-23 DIAGNOSIS — R220 Localized swelling, mass and lump, head: Secondary | ICD-10-CM | POA: Diagnosis not present

## 2022-01-23 MED ORDER — CLINDAMYCIN HCL 300 MG PO CAPS: 300.0000 mg | ORAL_CAPSULE | Freq: Three times a day (TID) | ORAL | 0 refills | Status: DC

## 2022-01-23 MED ORDER — TETRACAINE HCL 0.5 % OP SOLN
2.0000 [drp] | Freq: Once | OPHTHALMIC | Status: DC
  Filled 2022-01-23: qty 4

## 2022-01-23 MED ORDER — FLUORESCEIN SODIUM 1 MG OP STRP
1.0000 | ORAL_STRIP | Freq: Once | OPHTHALMIC | Status: DC
  Filled 2022-01-23: qty 1

## 2022-01-23 MED ORDER — CEFDINIR 300 MG PO CAPS: 300.0000 mg | ORAL_CAPSULE | Freq: Two times a day (BID) | ORAL | 0 refills | Status: DC

## 2022-01-23 NOTE — Discharge Instructions (Signed)
I am concerned that this redness is from an infection.  To treat this you will need 2 different antibiotics for about 5 days.  Follow-up with your primary care physician early next week, especially if not improving.  If you develop a fever, new or worsening redness, eye pain, headache, or any other new/concerning symptoms then return to the ER for evaluation.
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
## 2022-01-23 NOTE — ED Notes (Signed)
Pt d/c home with spouse per MD order. Discharge summary reviewed with pt, pt verbalizes understanding. Ambulatory off unit. No s/s of acute distress noted at discharge.
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
## 2022-01-23 NOTE — ED Notes (Signed)
Visual Acuity Screening: Both eyes- 20/25 Right Eye-20/20 Left eye-20/25  Pt wears glasses.
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
## 2022-01-27 ENCOUNTER — Telehealth: Payer: Self-pay

## 2022-01-27 NOTE — Telephone Encounter (Signed)
     Patient  visit on 9/29  at [place] you been able to follow up with your primary care physician? yes  The patient was or was not able to obtain any needed medicine or equipment. yes.  Are there diet recommendations that you are having difficulty following? na  Patient expresses understanding of discharge instructions and education provided has no other needs at this time. yes     [place], Care Management  [phone] [address], [place], [place] [address] Phone: [phone] Email: [email or web address]

## 2022-01-28 ENCOUNTER — Ambulatory Visit (INDEPENDENT_AMBULATORY_CARE_PROVIDER_SITE_OTHER): Payer: Medicare HMO | Admitting: Family Medicine

## 2022-01-28 ENCOUNTER — Encounter: Payer: Self-pay | Admitting: Family Medicine

## 2022-01-28 VITALS — BP 130/82 | HR 63 | Temp 98.0°F | Ht 71.0 in | Wt 230.0 lb

## 2022-01-28 DIAGNOSIS — H5789 Other specified disorders of eye and adnexa: Secondary | ICD-10-CM | POA: Diagnosis not present

## 2022-01-28 MED ORDER — LEVOCETIRIZINE DIHYDROCHLORIDE 5 MG PO TABS: 5.0000 mg | ORAL_TABLET | Freq: Every evening | ORAL | 2 refills | Status: DC

## 2022-01-28 MED ORDER — PREDNISONE 20 MG PO TABS: 40.0000 mg | ORAL_TABLET | Freq: Every day | ORAL | 0 refills | Status: AC

## 2022-01-28 NOTE — Patient Instructions (Addendum)
I think this is allergic.  Try not to scratch as this can make things worse. Avoid scented products while dealing with this. You may resume when the itchiness resolves. Cold/cool compresses can help.   When you do wash it, use only soap and water. Do not vigorously scrub. Apply triple antibiotic ointment (like Neosporin) twice daily. Keep the area clean and dry.   Things to look out for: increasing pain not relieved by acetaminophen, fevers, spreading redness, drainage of pus, or foul odor.  Claritin (loratadine), Allegra (fexofenadine), Zyrtec (cetirizine) which is also equivalent to Xyzal (levocetirizine); these are listed in order from weakest to strongest. Generic, and therefore cheaper, options are in the parentheses.   Flonase (fluticasone); nasal spray that is over the counter. 2 sprays each nostril, once daily. Aim towards the same side eye when you spray.  There are available OTC, and the generic versions, which may be cheaper, are in parentheses. Show this to a pharmacist if you have trouble finding any of these items.  Let us know if you need anything.
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
## 2022-01-30 DIAGNOSIS — F341 Dysthymic disorder: Secondary | ICD-10-CM | POA: Diagnosis not present

## 2022-01-30 DIAGNOSIS — R69 Illness, unspecified: Secondary | ICD-10-CM | POA: Diagnosis not present

## 2022-02-04 ENCOUNTER — Ambulatory Visit (INDEPENDENT_AMBULATORY_CARE_PROVIDER_SITE_OTHER): Payer: Medicare HMO | Admitting: Internal Medicine

## 2022-02-04 ENCOUNTER — Encounter: Payer: Self-pay | Admitting: Internal Medicine

## 2022-02-04 DIAGNOSIS — H5789 Other specified disorders of eye and adnexa: Secondary | ICD-10-CM | POA: Diagnosis not present

## 2022-02-04 DIAGNOSIS — H538 Other visual disturbances: Secondary | ICD-10-CM | POA: Diagnosis not present

## 2022-02-04 DIAGNOSIS — E1159 Type 2 diabetes mellitus with other circulatory complications: Secondary | ICD-10-CM | POA: Diagnosis not present

## 2022-02-04 DIAGNOSIS — L03114 Cellulitis of left upper limb: Secondary | ICD-10-CM | POA: Diagnosis not present

## 2022-02-04 MED ORDER — HYDROCORTISONE 2 % EX LOTN: TOPICAL_LOTION | CUTANEOUS | 1 refills | Status: DC

## 2022-02-04 MED ORDER — DOXYCYCLINE HYCLATE 100 MG PO TABS: 100.0000 mg | ORAL_TABLET | Freq: Two times a day (BID) | ORAL | 0 refills | Status: DC

## 2022-02-04 NOTE — Patient Instructions (Addendum)
Apply the lotion twice daily as needed for itching or excessive dryness  Start taking the antibiotic for 10 days  We are referring you to the eye doctor, if you do not hear from them in the next few days please let us know  Call anytime if you feel the arm is more swelling, you have fever or chills.  Also call if the swelling around your eyes return   Schedule a follow-up with me in 2 weeks.
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
## 2022-02-05 DIAGNOSIS — H04123 Dry eye syndrome of bilateral lacrimal glands: Secondary | ICD-10-CM | POA: Diagnosis not present

## 2022-02-05 DIAGNOSIS — H21511 Anterior synechiae (iris), right eye: Secondary | ICD-10-CM | POA: Diagnosis not present

## 2022-02-10 ENCOUNTER — Other Ambulatory Visit: Payer: Self-pay | Admitting: Internal Medicine

## 2022-02-12 DIAGNOSIS — H90A22 Sensorineural hearing loss, unilateral, left ear, with restricted hearing on the contralateral side: Secondary | ICD-10-CM | POA: Diagnosis not present

## 2022-02-12 DIAGNOSIS — H903 Sensorineural hearing loss, bilateral: Secondary | ICD-10-CM | POA: Diagnosis not present

## 2022-02-12 DIAGNOSIS — Z77122 Contact with and (suspected) exposure to noise: Secondary | ICD-10-CM | POA: Diagnosis not present

## 2022-02-12 DIAGNOSIS — H9312 Tinnitus, left ear: Secondary | ICD-10-CM | POA: Diagnosis not present

## 2022-02-19 ENCOUNTER — Encounter: Payer: Self-pay | Admitting: Internal Medicine

## 2022-02-19 ENCOUNTER — Ambulatory Visit (INDEPENDENT_AMBULATORY_CARE_PROVIDER_SITE_OTHER): Payer: Medicare HMO | Admitting: Internal Medicine

## 2022-02-19 VITALS — BP 126/84 | HR 51 | Temp 97.6°F | Resp 18 | Ht 71.0 in | Wt 230.5 lb

## 2022-02-19 DIAGNOSIS — L03114 Cellulitis of left upper limb: Secondary | ICD-10-CM | POA: Diagnosis not present

## 2022-02-19 DIAGNOSIS — H538 Other visual disturbances: Secondary | ICD-10-CM
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
## 2022-02-19 NOTE — Patient Instructions (Addendum)
  See you in November

## 2022-02-23 DIAGNOSIS — Z0184 Encounter for antibody response examination: Secondary | ICD-10-CM | POA: Diagnosis not present

## 2022-02-24 DIAGNOSIS — F341 Dysthymic disorder: Secondary | ICD-10-CM | POA: Diagnosis not present

## 2022-02-24 DIAGNOSIS — R69 Illness, unspecified: Secondary | ICD-10-CM | POA: Diagnosis not present

## 2022-03-02 ENCOUNTER — Other Ambulatory Visit: Payer: Self-pay | Admitting: Internal Medicine

## 2022-03-03 ENCOUNTER — Other Ambulatory Visit: Payer: Self-pay | Admitting: Internal Medicine

## 2022-03-11 ENCOUNTER — Encounter: Payer: Self-pay | Admitting: *Deleted

## 2022-03-13 ENCOUNTER — Encounter: Payer: Self-pay | Admitting: Internal Medicine

## 2022-03-13 ENCOUNTER — Ambulatory Visit (INDEPENDENT_AMBULATORY_CARE_PROVIDER_SITE_OTHER): Payer: Medicare HMO | Admitting: Internal Medicine

## 2022-03-13 VITALS — BP 136/82 | HR 76 | Temp 97.8°F | Resp 18 | Ht 71.0 in | Wt 233.2 lb

## 2022-03-13 DIAGNOSIS — Z87891 Personal history of nicotine dependence: Secondary | ICD-10-CM

## 2022-03-13 DIAGNOSIS — Z122 Encounter for screening for malignant neoplasm of respiratory organs: Secondary | ICD-10-CM | POA: Diagnosis not present

## 2022-03-13 DIAGNOSIS — R942 Abnormal results of pulmonary function studies: Secondary | ICD-10-CM

## 2022-03-13 DIAGNOSIS — E1159 Type 2 diabetes mellitus with other circulatory complications: Secondary | ICD-10-CM

## 2022-03-13 DIAGNOSIS — I10 Essential (primary) hypertension: Secondary | ICD-10-CM

## 2022-03-13 DIAGNOSIS — R269 Unspecified abnormalities of gait and mobility: Secondary | ICD-10-CM | POA: Diagnosis not present

## 2022-03-13 DIAGNOSIS — J479 Bronchiectasis, uncomplicated: Secondary | ICD-10-CM | POA: Diagnosis not present

## 2022-03-13 LAB — BASIC METABOLIC PANEL
BUN: 18 mg/dL (ref 6–23)
CO2: 24 mEq/L (ref 19–32)
Calcium: 9.2 mg/dL (ref 8.4–10.5)
Chloride: 103 mEq/L (ref 96–112)
Creatinine, Ser: 1 mg/dL (ref 0.40–1.50)
GFR: 72.56 mL/min (ref >60.00)
Glucose, Bld: 134 mg/dL — ABNORMAL HIGH (ref 70–99)
Potassium: 4.3 mEq/L (ref 3.5–5.1)
Sodium: 136 mEq/L (ref 135–145)

## 2022-03-13 LAB — CBC WITH DIFFERENTIAL/PLATELET
Basophils Absolute: 0.1 10*3/uL (ref 0.0–0.1)
Basophils Relative: 0.9 % (ref 0.0–3.0)
Eosinophils Absolute: 0.4 10*3/uL (ref 0.0–0.7)
Eosinophils Relative: 5.5 % — ABNORMAL HIGH (ref 0.0–5.0)
HCT: 40.1 % (ref 39.0–52.0)
Hemoglobin: 13.2 g/dL (ref 13.0–17.0)
Lymphocytes Relative: 25.8 % (ref 12.0–46.0)
Lymphs Abs: 1.7 10*3/uL (ref 0.7–4.0)
MCHC: 33 g/dL (ref 30.0–36.0)
MCV: 85.8 fl (ref 78.0–100.0)
Monocytes Absolute: 0.6 10*3/uL (ref 0.1–1.0)
Monocytes Relative: 9.2 % (ref 3.0–12.0)
Neutro Abs: 3.9 10*3/uL (ref 1.4–7.7)
Neutrophils Relative %: 58.6 % (ref 43.0–77.0)
Platelets: 278 10*3/uL (ref 150.0–400.0)
RBC: 4.68 Mil/uL (ref 4.22–5.81)
RDW: 14.9 % (ref 11.5–15.5)
WBC: 6.7 10*3/uL (ref 4.0–10.5)

## 2022-03-13 LAB — HEMOGLOBIN A1C: Hgb A1c MFr Bld: 6.6 % — ABNORMAL HIGH (ref 4.6–6.5)
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
## 2022-03-13 NOTE — Patient Instructions (Addendum)
Vaccines I recommend:  RSV vaccine  Will schedule a CT of your chest  We will schedule pulmonary function test.  Please call the phone number: [phone]    [place] [facility] : Get the blood work     [place], [place] back for a physical by 05-2022  Per our records you are due for your diabetic eye exam. Please contact your eye doctor to schedule an appointment. Please have them send copies of your office visit notes to [place]. Our fax number is [phone]. If you need a referral to an eye doctor please let us know.

## 2022-03-14 DIAGNOSIS — H90A22 Sensorineural hearing loss, unilateral, left ear, with restricted hearing on the contralateral side: Secondary | ICD-10-CM | POA: Diagnosis not present

## 2022-03-14 DIAGNOSIS — H903 Sensorineural hearing loss, bilateral: Secondary | ICD-10-CM | POA: Diagnosis not present

## 2022-03-14 NOTE — Assessment & Plan Note (Signed)
DM: Currently on metformin and pioglitazone, reports good ambulatory CBGs, check A1c. HTN: Reports good compliance with losartan, check a BMP and CBC High cholesterol: On atorvastatin, last LDL 71, recheck on RTC Depression anxiety: Follow-up elsewhere.  Zoloft was switch to Pristiq.  Also on Abilify. Bronchiectasis, mild emphysema, pulmonary nodules, severe diffusion defect per  PFTs 2019. Upon arrival O2 sat was 91%, recheck 93%.  Reports no symptoms. -Proceed with a CT lung cancer screening - PFTs HOH: Follow-up by Dr. Elwyn Reach, to have a brain MRI. Gait disorder: Has some difficulty with his gait, offered PT, declined. Preventive care: Had a flu shot and COVID-vaccine.  RSV is an option RTC 05-2022 cpx

## 2022-04-03 DIAGNOSIS — H903 Sensorineural hearing loss, bilateral: Secondary | ICD-10-CM | POA: Diagnosis not present

## 2022-04-03 DIAGNOSIS — Z77122 Contact with and (suspected) exposure to noise: Secondary | ICD-10-CM | POA: Diagnosis not present

## 2022-04-03 DIAGNOSIS — H90A22 Sensorineural hearing loss, unilateral, left ear, with restricted hearing on the contralateral side: Secondary | ICD-10-CM | POA: Diagnosis not present

## 2022-04-03 DIAGNOSIS — H9312 Tinnitus, left ear: Secondary | ICD-10-CM | POA: Diagnosis not present

## 2022-04-03 DIAGNOSIS — Z974 Presence of external hearing-aid: Secondary | ICD-10-CM | POA: Diagnosis not present

## 2022-04-03 DIAGNOSIS — H9042 Sensorineural hearing loss, unilateral, left ear, with unrestricted hearing on the contralateral side: Secondary | ICD-10-CM | POA: Diagnosis not present

## 2022-04-07 ENCOUNTER — Other Ambulatory Visit: Payer: Self-pay | Admitting: *Deleted

## 2022-04-07 DIAGNOSIS — Z87891 Personal history of nicotine dependence: Secondary | ICD-10-CM

## 2022-04-07 DIAGNOSIS — Z122 Encounter for screening for malignant neoplasm of respiratory organs: Secondary | ICD-10-CM

## 2022-04-14 DIAGNOSIS — F341 Dysthymic disorder: Secondary | ICD-10-CM | POA: Diagnosis not present

## 2022-04-14 DIAGNOSIS — R69 Illness, unspecified: Secondary | ICD-10-CM | POA: Diagnosis not present
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
## 2022-04-15 ENCOUNTER — Encounter: Payer: Self-pay | Admitting: Adult Health

## 2022-04-15 ENCOUNTER — Ambulatory Visit: Payer: Medicare HMO | Admitting: Adult Health

## 2022-04-15 VITALS — BP 143/86 | HR 77 | Ht 71.0 in | Wt 231.8 lb

## 2022-04-15 DIAGNOSIS — G459 Transient cerebral ischemic attack, unspecified: Secondary | ICD-10-CM | POA: Diagnosis not present

## 2022-04-15 DIAGNOSIS — G20C Parkinsonism, unspecified: Secondary | ICD-10-CM | POA: Diagnosis not present

## 2022-04-15 DIAGNOSIS — G629 Polyneuropathy, unspecified: Secondary | ICD-10-CM

## 2022-04-15 MED ORDER — GABAPENTIN 300 MG PO CAPS: 300.0000 mg | ORAL_CAPSULE | Freq: Two times a day (BID) | ORAL | 3 refills | Status: DC

## 2022-04-15 NOTE — Patient Instructions (Addendum)
Continue Sinemet 1 tab - try to take routinely 3 times daily for tremors  Continue gabapentin '300mg'$  twice daily  If hand symptoms begin to worsen or greatly interfere with functioning, please let me know    Follow up in 6 months with Dr. Leonie Man or call earlier if needed

## 2022-04-20 ENCOUNTER — Other Ambulatory Visit: Payer: Self-pay | Admitting: Internal Medicine

## 2022-04-23 DIAGNOSIS — H903 Sensorineural hearing loss, bilateral: Secondary | ICD-10-CM | POA: Diagnosis not present

## 2022-05-12 NOTE — Patient Instructions (Signed)
Thank you for participating in the [facility]. It was our pleasure to meet you today. We will call you with the results of your scan within the next few days. Your scan will be assigned a Lung RADS category score by the physicians reading the scans.  This Lung RADS score determines follow up scanning.  See below for description of categories, and follow up screening recommendations. We will be in touch to schedule your follow up screening annually or based on recommendations of our providers. We will fax a copy of your scan results to your Primary Care Physician, or the physician who referred you to the program, to ensure they have the results. Please call the office if you have any questions or concerns regarding your scanning experience or results.  Our office number is [phone]. Please speak with Denise Phelps, RN. , or  Denise Buckner RN, They are  our Lung Cancer Screening RN.'s If They are unavailable when you call, Please leave a message on the voice mail. We will return your call at our earliest convenience.This voice mail is monitored several times a day.  Remember, if your scan is normal, we will scan you annually as long as you continue to meet the criteria for the program. (Age 55-77, Current smoker or smoker who has quit within the last 15 years). If you are a smoker, remember, quitting is the single most powerful action that you can take to decrease your risk of lung cancer and other pulmonary, breathing related problems. We know quitting is hard, and we are here to help.  Please let us know if there is anything we can do to help you meet your goal of quitting. If you are a former smoker, congratulations. We are proud of you! Remain smoke free! Remember you can refer friends or family members through the number above.  We will screen them to make sure they meet criteria for the program. Thank you for helping us take better care of you by  participating in Lung Screening.  You can receive free nicotine replacement therapy ( patches, gum or mints) by calling 1-800-QUIT NOW. Please call so we can get you on the path to becoming  a non-smoker. I know it is hard, but you can do this!  Lung RADS Categories:  Lung RADS 1: no nodules or definitely non-concerning nodules.  Recommendation is for a repeat annual scan in 12 months.  Lung RADS 2:  nodules that are non-concerning in appearance and behavior with a very low likelihood of becoming an active cancer. Recommendation is for a repeat annual scan in 12 months.  Lung RADS 3: nodules that are probably non-concerning , includes nodules with a low likelihood of becoming an active cancer.  Recommendation is for a 6-month repeat screening scan. Often noted after an upper respiratory illness. We will be in touch to make sure you have no questions, and to schedule your 6-month scan.  Lung RADS 4 A: nodules with concerning findings, recommendation is most often for a follow up scan in 3 months or additional testing based on our provider's assessment of the scan. We will be in touch to make sure you have no questions and to schedule the recommended 3 month follow up scan.  Lung RADS 4 B:  indicates findings that are concerning. We will be in touch with you to schedule additional diagnostic testing based on our provider's  assessment of the scan.  Other options for assistance in smoking cessation (   As covered by your insurance benefits)  Hypnosis for smoking cessation  [employer] [phone]  Acupuncture for smoking cessation  [facility] [phone]   

## 2022-05-12 NOTE — Progress Notes (Signed)
Virtual Visit via Telephone Note  I connected with [name] on 05/13/22 at  8:30 AM EST by telephone and verified that I am speaking with the correct person using two identifiers.  Location: Patient: Home Provider: Office   I discussed the limitations, risks, security and privacy concerns of performing an evaluation and management service by telephone and the availability of in person appointments. I also discussed with the patient that there may be a patient responsible charge related to this service. The patient expressed understanding and agreed to proceed.   Shared Decision Making Visit Lung Cancer Screening Program [phone])   Eligibility: Age 77 y.o. Pack Years Smoking History Calculation 102 (# packs/per year x # years smoked) Recent History of coughing up blood  no Unexplained weight loss? no ( >Than 15 pounds within the last 6 months ) Prior History Lung / other cancer no (Diagnosis within the last 5 years already requiring surveillance chest CT Scans). Smoking Status Former Smoker Former Smokers: Years since quit: 5 years  Quit Date: 2018  Visit Components: Discussion included one or more decision making aids. yes Discussion included risk/benefits of screening. yes Discussion included potential follow up diagnostic testing for abnormal scans. yes Discussion included meaning and risk of over diagnosis. yes Discussion included meaning and risk of False Positives. yes Discussion included meaning of total radiation exposure. yes  Counseling Included: Importance of adherence to annual lung cancer LDCT screening. yes Impact of comorbidities on ability to participate in the program. yes Ability and willingness to under diagnostic treatment. yes  Smoking Cessation Counseling: Current Smokers:  Discussed importance of smoking cessation. yes Information about tobacco cessation classes and interventions provided to patient. yes Patient provided with "ticket" for LDCT  Scan. NA Symptomatic Patient. no  Counseling(Intermediate counseling: > three minutes) [address]406 Diagnosis Code: Tobacco Use Z72.0 Asymptomatic Patient yes  Counseling (Intermediate counseling: > three minutes counseling) Z6629 Former Smokers:  Discussed the importance of maintaining cigarette abstinence. yes Diagnosis Code: Personal History of Nicotine Dependence. U76.546 Information about tobacco cessation classes and interventions provided to patient. Yes Patient provided with "ticket" for LDCT Scan. NA Written Order for Lung Cancer Screening with LDCT placed in Epic. Yes (CT Chest Lung Cancer Screening Low Dose W/O CM) [id number] Z12.2-Screening of respiratory organs Z87.891-Personal history of nicotine dependence  I have spent 25 minutes of face to face/ virtual visit   time with Mr Spivack discussing the risks and benefits of lung cancer screening. We viewed / discussed a power point together that explained in detail the above noted topics. We paused at intervals to allow for questions to be asked and answered to ensure understanding.We discussed that the single most powerful action that he can take to decrease his risk of developing lung cancer is to quit smoking. We discussed whether or not he is ready to commit to setting a quit date. We discussed options for tools to aid in quitting smoking including nicotine replacement therapy, non-nicotine medications, support groups, Quit Smart classes, and behavior modification. We discussed that often times setting smaller, more achievable goals, such as eliminating 1 cigarette a day for a week and then 2 cigarettes a day for a week can be helpful in slowly decreasing the number of cigarettes smoked. This allows for a sense of accomplishment as well as providing a clinical benefit. I provided him  with smoking cessation  information  with contact information for community resources, classes, free nicotine replacement therapy, and access to mobile apps, text  messaging, and  on-line smoking cessation help. I have also provided  him  the office contact information in the event he needs to contact me, or the screening staff. We discussed the time and location of the scan, and that either Doroteo Glassman RN, Joella Prince, RN  or I will call / send a letter with the results within 24-72 hours of receiving them. The patient verbalized understanding of all of  the above and had no further questions upon leaving the office. They have my contact information in the event they have any further questions.  I spent 3-5 minutes counseling on smoking cessation and the health risks of continued tobacco abuse.  I explained to the patient that there has been a high incidence of coronary artery disease noted on these exams. I explained that this is a non-gated exam therefore degree or severity cannot be determined. This patient is on statin therapy. I have asked the patient to follow-up with their PCP regarding any incidental finding of coronary artery disease and management with diet or medication as their PCP  feels is clinically indicated. The patient verbalized understanding of the above and had no further questions upon completion of the visit.   Martyn Ehrich, NP

## 2022-05-13 ENCOUNTER — Ambulatory Visit (INDEPENDENT_AMBULATORY_CARE_PROVIDER_SITE_OTHER): Payer: Medicare HMO | Admitting: Primary Care

## 2022-05-13 DIAGNOSIS — Z87891 Personal history of nicotine dependence: Secondary | ICD-10-CM

## 2022-05-14 ENCOUNTER — Ambulatory Visit
Admission: RE | Admit: 2022-05-14 | Discharge: 2022-05-14 | Disposition: A | Discharge status: Home / Self Care | Payer: Medicare HMO | Source: Ambulatory Visit | Attending: Acute Care | Admitting: Acute Care

## 2022-05-14 DIAGNOSIS — J432 Centrilobular emphysema: Secondary | ICD-10-CM | POA: Diagnosis not present

## 2022-05-14 DIAGNOSIS — Z87891 Personal history of nicotine dependence: Secondary | ICD-10-CM

## 2022-05-14 DIAGNOSIS — K449 Diaphragmatic hernia without obstruction or gangrene: Secondary | ICD-10-CM | POA: Diagnosis not present

## 2022-05-14 DIAGNOSIS — Z122 Encounter for screening for malignant neoplasm of respiratory organs: Secondary | ICD-10-CM

## 2022-05-14 DIAGNOSIS — I2510 Atherosclerotic heart disease of native coronary artery without angina pectoris: Secondary | ICD-10-CM | POA: Diagnosis not present

## 2022-05-15 ENCOUNTER — Other Ambulatory Visit: Payer: Self-pay

## 2022-05-15 DIAGNOSIS — Z122 Encounter for screening for malignant neoplasm of respiratory organs: Secondary | ICD-10-CM

## 2022-05-15 DIAGNOSIS — Z87891 Personal history of nicotine dependence: Secondary | ICD-10-CM

## 2022-05-19 DIAGNOSIS — Z008 Encounter for other general examination: Secondary | ICD-10-CM | POA: Diagnosis not present

## 2022-05-19 DIAGNOSIS — R2689 Other abnormalities of gait and mobility: Secondary | ICD-10-CM | POA: Diagnosis not present

## 2022-05-19 DIAGNOSIS — K219 Gastro-esophageal reflux disease without esophagitis: Secondary | ICD-10-CM | POA: Diagnosis not present

## 2022-05-19 DIAGNOSIS — N400 Enlarged prostate without lower urinary tract symptoms: Secondary | ICD-10-CM | POA: Diagnosis not present

## 2022-05-19 DIAGNOSIS — M1990 Unspecified osteoarthritis, unspecified site: Secondary | ICD-10-CM | POA: Diagnosis not present

## 2022-05-19 DIAGNOSIS — E1140 Type 2 diabetes mellitus with diabetic neuropathy, unspecified: Secondary | ICD-10-CM | POA: Diagnosis not present

## 2022-05-19 DIAGNOSIS — I2510 Atherosclerotic heart disease of native coronary artery without angina pectoris: Secondary | ICD-10-CM | POA: Diagnosis not present

## 2022-05-19 DIAGNOSIS — R32 Unspecified urinary incontinence: Secondary | ICD-10-CM | POA: Diagnosis not present

## 2022-05-19 DIAGNOSIS — R69 Illness, unspecified: Secondary | ICD-10-CM | POA: Diagnosis not present

## 2022-05-19 DIAGNOSIS — N529 Male erectile dysfunction, unspecified: Secondary | ICD-10-CM | POA: Diagnosis not present

## 2022-05-19 DIAGNOSIS — E785 Hyperlipidemia, unspecified: Secondary | ICD-10-CM | POA: Diagnosis not present

## 2022-05-19 DIAGNOSIS — I10 Essential (primary) hypertension: Secondary | ICD-10-CM | POA: Diagnosis not present

## 2022-05-19 DIAGNOSIS — G4733 Obstructive sleep apnea (adult) (pediatric): Secondary | ICD-10-CM | POA: Diagnosis not present

## 2022-05-22 ENCOUNTER — Other Ambulatory Visit: Payer: Self-pay | Admitting: Internal Medicine

## 2022-05-22 DIAGNOSIS — Z77122 Contact with and (suspected) exposure to noise: Secondary | ICD-10-CM | POA: Diagnosis not present

## 2022-05-22 DIAGNOSIS — H9312 Tinnitus, left ear: Secondary | ICD-10-CM | POA: Diagnosis not present

## 2022-05-22 DIAGNOSIS — H90A22 Sensorineural hearing loss, unilateral, left ear, with restricted hearing on the contralateral side: Secondary | ICD-10-CM | POA: Diagnosis not present

## 2022-05-22 DIAGNOSIS — H903 Sensorineural hearing loss, bilateral: Secondary | ICD-10-CM | POA: Diagnosis not present

## 2022-05-22 NOTE — Telephone Encounter (Signed)
Receiving warning- should Pt be on losartan and benazepril?

## 2022-05-22 NOTE — Telephone Encounter (Signed)
Spoke w/ Pt- he checked his bottles- he is not or does not have benazepril. Losartan refilled.

## 2022-05-22 NOTE — Telephone Encounter (Signed)
Apparently benazepril  was prescribed elsewhere. Please call patient. If he is taking benazepril need to be stopped. Continue losartan as recommended. Monitor BPs.

## 2022-05-25 ENCOUNTER — Telehealth: Payer: Self-pay | Admitting: Internal Medicine

## 2022-05-25 NOTE — Telephone Encounter (Signed)
Pt states he is having surgery with Dr.Kraus and they are requesting last pneumonia shot. He stated that is all they need. Their fax is [phone].

## 2022-05-25 NOTE — Telephone Encounter (Signed)
Immunization record faxed

## 2022-05-26 DIAGNOSIS — H9313 Tinnitus, bilateral: Secondary | ICD-10-CM | POA: Diagnosis not present

## 2022-05-26 DIAGNOSIS — H903 Sensorineural hearing loss, bilateral: Secondary | ICD-10-CM | POA: Diagnosis not present

## 2022-06-04 ENCOUNTER — Encounter: Payer: Self-pay | Admitting: Internal Medicine

## 2022-06-30 ENCOUNTER — Encounter: Payer: Self-pay | Admitting: Internal Medicine

## 2022-06-30 ENCOUNTER — Ambulatory Visit (INDEPENDENT_AMBULATORY_CARE_PROVIDER_SITE_OTHER): Payer: Medicare HMO | Admitting: Internal Medicine

## 2022-06-30 VITALS — BP 128/68 | HR 75 | Temp 97.6°F | Resp 18 | Ht 71.0 in | Wt 223.5 lb

## 2022-06-30 DIAGNOSIS — Z0000 Encounter for general adult medical examination without abnormal findings: Secondary | ICD-10-CM | POA: Diagnosis not present

## 2022-06-30 DIAGNOSIS — E785 Hyperlipidemia, unspecified: Secondary | ICD-10-CM

## 2022-06-30 DIAGNOSIS — I10 Essential (primary) hypertension: Secondary | ICD-10-CM

## 2022-06-30 DIAGNOSIS — R269 Unspecified abnormalities of gait and mobility: Secondary | ICD-10-CM | POA: Diagnosis not present

## 2022-06-30 DIAGNOSIS — E1159 Type 2 diabetes mellitus with other circulatory complications: Secondary | ICD-10-CM | POA: Diagnosis not present

## 2022-06-30 LAB — BASIC METABOLIC PANEL
BUN: 20 mg/dL (ref 6–23)
CO2: 24 mEq/L (ref 19–32)
Calcium: 10 mg/dL (ref 8.4–10.5)
Chloride: 104 mEq/L (ref 96–112)
Creatinine, Ser: 0.85 mg/dL (ref 0.40–1.50)
GFR: 83.6 mL/min (ref >60.00)
Glucose, Bld: 94 mg/dL (ref 70–99)
Potassium: 4.8 mEq/L (ref 3.5–5.1)
Sodium: 138 mEq/L (ref 135–145)

## 2022-06-30 LAB — LIPID PANEL
Cholesterol: 129 mg/dL (ref 0–200)
HDL: 60 mg/dL (ref >39.00)
LDL Cholesterol: 50 mg/dL (ref 0–99)
NonHDL: 69.2
Total CHOL/HDL Ratio: 2
Triglycerides: 97 mg/dL (ref 0.0–149.0)
VLDL: 19.4 mg/dL (ref 0.0–40.0)

## 2022-06-30 LAB — AST: AST: 29 U/L (ref 0–37)

## 2022-06-30 LAB — ALT: ALT: 28 U/L (ref 0–53)

## 2022-06-30 LAB — HEMOGLOBIN A1C: Hgb A1c MFr Bld: 6.3 % (ref 4.6–6.5)

## 2022-06-30 LAB — MICROALBUMIN / CREATININE URINE RATIO
Creatinine,U: 54.1 mg/dL
Microalb Creat Ratio: 4.1 mg/g (ref 0.0–30.0)
Microalb, Ur: 2.2 mg/dL — ABNORMAL HIGH (ref 0.0–1.9)
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
## 2022-06-30 NOTE — Assessment & Plan Note (Signed)
Here for CPX DM: On metformin, pioglitazone, check A1c. HTN: Seems well-controlled, continue losartan.  Check BMP High cholesterol: On atorvastatin, checking labs CAD, history of TIA: On aspirin, atorvastatin.  No symptoms GERD, history of Barrett's.  On pantoprazole, denies dysphagia. Parkinsonian tremor, TIA, neuropathy saw neurology December 2023, felt to be stable MSK: Chronic back pain, had a recent fall, we talk about surgery reassessment versus PT.  We agreed on PT.  Referral sent HOH: To have a cochlear implant soon. RTC 4 to 5 months

## 2022-06-30 NOTE — Assessment & Plan Note (Signed)
-  Td 2018  -PNM 23: 2012, 2022 - RSV recommended -prevnar 13: 2015 -s/p shingrix - s/p covid vaccines  > UTD  -had a Flu shot   --CCS: cscope 2016, Cscope wnl 2022  -- h/o Prostate cancer LOV with urology a year ago, pt plans to call and set up. --Lung cancer screening: Last CT 04-2022, next 12 months. --diet, exercise: Discussed --labs: BMP AST ALT FLP A1c -- Healthcare POA: See AVS

## 2022-06-30 NOTE — Patient Instructions (Addendum)
Vaccines I recommend:  RSV vaccine  Check the  blood pressure regularly BP GOAL is between 110/65 and  135/85. If it is consistently higher or lower, let me know  Please see your urologist.   GO TO THE LAB : Get the blood work     [place], [place] back for   a checkup in 4 to 5 months     "[place] of attorney" ,  "Living will" (Advance care planning documents)  If you already have a living will or healthcare power of attorney, is recommended you bring the copy to be scanned in your chart.   The document will be available to all the doctors you see in the system.  Advance care planning is a process that supports adults in  understanding and sharing their preferences regarding future medical care.  The patient's preferences are recorded in documents called Advance Directives and the can be modified at any time while the patient is in full mental capacity.   If you don't have one, please consider create one.      More information at: [email or web address]   Per our records you are due for your diabetic eye exam. Please contact your eye doctor to schedule an appointment. Please have them send copies of your office visit notes to [place]. Our fax number is [phone]. If you need a referral to an eye doctor please let us know.

## 2022-07-01 ENCOUNTER — Other Ambulatory Visit: Payer: Self-pay | Admitting: Internal Medicine

## 2022-07-02 ENCOUNTER — Ambulatory Visit: Payer: Medicare HMO

## 2022-07-07 ENCOUNTER — Ambulatory Visit: Payer: Medicare HMO

## 2022-07-14 DIAGNOSIS — F341 Dysthymic disorder: Secondary | ICD-10-CM | POA: Diagnosis not present

## 2022-07-14 DIAGNOSIS — R69 Illness, unspecified: Secondary | ICD-10-CM | POA: Diagnosis not present

## 2022-07-14 DIAGNOSIS — Z1331 Encounter for screening for depression: Secondary | ICD-10-CM | POA: Diagnosis not present

## 2022-07-16 ENCOUNTER — Ambulatory Visit: Payer: Medicare HMO | Attending: Internal Medicine | Admitting: Physical Therapy

## 2022-07-16 ENCOUNTER — Encounter: Payer: Self-pay | Admitting: Physical Therapy

## 2022-07-16 DIAGNOSIS — M6281 Muscle weakness (generalized): Secondary | ICD-10-CM | POA: Insufficient documentation

## 2022-07-16 DIAGNOSIS — R262 Difficulty in walking, not elsewhere classified: Secondary | ICD-10-CM | POA: Diagnosis not present

## 2022-07-16 DIAGNOSIS — R269 Unspecified abnormalities of gait and mobility: Secondary | ICD-10-CM | POA: Insufficient documentation

## 2022-07-16 DIAGNOSIS — R2681 Unsteadiness on feet: Secondary | ICD-10-CM | POA: Diagnosis not present

## 2022-07-16 DIAGNOSIS — M5459 Other low back pain: Secondary | ICD-10-CM | POA: Insufficient documentation
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
## 2022-07-20 ENCOUNTER — Encounter: Payer: Self-pay | Admitting: Physical Therapy

## 2022-07-20 ENCOUNTER — Ambulatory Visit: Payer: Medicare HMO | Admitting: Physical Therapy

## 2022-07-20 DIAGNOSIS — M6281 Muscle weakness (generalized): Secondary | ICD-10-CM

## 2022-07-20 DIAGNOSIS — R262 Difficulty in walking, not elsewhere classified: Secondary | ICD-10-CM | POA: Diagnosis not present

## 2022-07-20 DIAGNOSIS — M5459 Other low back pain: Secondary | ICD-10-CM

## 2022-07-20 DIAGNOSIS — R269 Unspecified abnormalities of gait and mobility: Secondary | ICD-10-CM | POA: Diagnosis not present

## 2022-07-20 DIAGNOSIS — R2681 Unsteadiness on feet: Secondary | ICD-10-CM | POA: Diagnosis not present
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
## 2022-07-22 ENCOUNTER — Ambulatory Visit: Payer: Medicare HMO

## 2022-07-22 DIAGNOSIS — E119 Type 2 diabetes mellitus without complications: Secondary | ICD-10-CM | POA: Diagnosis not present

## 2022-07-22 DIAGNOSIS — Z7984 Long term (current) use of oral hypoglycemic drugs: Secondary | ICD-10-CM | POA: Diagnosis not present

## 2022-07-22 DIAGNOSIS — G4733 Obstructive sleep apnea (adult) (pediatric): Secondary | ICD-10-CM | POA: Diagnosis not present

## 2022-07-22 DIAGNOSIS — I10 Essential (primary) hypertension: Secondary | ICD-10-CM | POA: Diagnosis not present

## 2022-07-22 DIAGNOSIS — E785 Hyperlipidemia, unspecified: Secondary | ICD-10-CM | POA: Diagnosis not present

## 2022-07-22 DIAGNOSIS — H903 Sensorineural hearing loss, bilateral: Secondary | ICD-10-CM | POA: Diagnosis not present

## 2022-07-22 DIAGNOSIS — H90A22 Sensorineural hearing loss, unilateral, left ear, with restricted hearing on the contralateral side: Secondary | ICD-10-CM | POA: Diagnosis not present

## 2022-07-22 DIAGNOSIS — H9042 Sensorineural hearing loss, unilateral, left ear, with unrestricted hearing on the contralateral side: Secondary | ICD-10-CM | POA: Diagnosis not present

## 2022-07-22 DIAGNOSIS — Z794 Long term (current) use of insulin: Secondary | ICD-10-CM | POA: Diagnosis not present

## 2022-07-22 DIAGNOSIS — E871 Hypo-osmolality and hyponatremia: Secondary | ICD-10-CM | POA: Diagnosis not present

## 2022-07-22 DIAGNOSIS — Z79899 Other long term (current) drug therapy: Secondary | ICD-10-CM | POA: Diagnosis not present

## 2022-07-22 DIAGNOSIS — H9312 Tinnitus, left ear: Secondary | ICD-10-CM | POA: Diagnosis not present

## 2022-07-23 DIAGNOSIS — E785 Hyperlipidemia, unspecified: Secondary | ICD-10-CM | POA: Diagnosis not present

## 2022-07-23 DIAGNOSIS — E119 Type 2 diabetes mellitus without complications: Secondary | ICD-10-CM | POA: Diagnosis not present

## 2022-07-23 DIAGNOSIS — Z79899 Other long term (current) drug therapy: Secondary | ICD-10-CM | POA: Diagnosis not present

## 2022-07-23 DIAGNOSIS — H903 Sensorineural hearing loss, bilateral: Secondary | ICD-10-CM | POA: Diagnosis not present

## 2022-07-23 DIAGNOSIS — I10 Essential (primary) hypertension: Secondary | ICD-10-CM | POA: Diagnosis not present

## 2022-07-23 DIAGNOSIS — Z7984 Long term (current) use of oral hypoglycemic drugs: Secondary | ICD-10-CM | POA: Diagnosis not present

## 2022-07-23 DIAGNOSIS — G4733 Obstructive sleep apnea (adult) (pediatric): Secondary | ICD-10-CM | POA: Diagnosis not present

## 2022-07-23 DIAGNOSIS — H9312 Tinnitus, left ear: Secondary | ICD-10-CM | POA: Diagnosis not present

## 2022-07-23 DIAGNOSIS — Z794 Long term (current) use of insulin: Secondary | ICD-10-CM | POA: Diagnosis not present

## 2022-07-23 DIAGNOSIS — H90A22 Sensorineural hearing loss, unilateral, left ear, with restricted hearing on the contralateral side: Secondary | ICD-10-CM | POA: Diagnosis not present

## 2022-07-23 DIAGNOSIS — E871 Hypo-osmolality and hyponatremia: Secondary | ICD-10-CM | POA: Diagnosis not present

## 2022-07-23 LAB — LAB REPORT - SCANNED
A1c: 6.1
EGFR: 128

## 2022-07-26 ENCOUNTER — Other Ambulatory Visit: Payer: Self-pay | Admitting: Internal Medicine

## 2022-07-28 ENCOUNTER — Other Ambulatory Visit: Payer: Self-pay

## 2022-07-28 ENCOUNTER — Ambulatory Visit: Payer: Medicare HMO | Attending: Internal Medicine

## 2022-07-28 DIAGNOSIS — R262 Difficulty in walking, not elsewhere classified: Secondary | ICD-10-CM | POA: Insufficient documentation

## 2022-07-28 DIAGNOSIS — M5459 Other low back pain: Secondary | ICD-10-CM | POA: Insufficient documentation

## 2022-07-28 DIAGNOSIS — M6281 Muscle weakness (generalized): Secondary | ICD-10-CM | POA: Insufficient documentation

## 2022-07-28 DIAGNOSIS — R2681 Unsteadiness on feet: Secondary | ICD-10-CM | POA: Diagnosis not present
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
## 2022-07-30 ENCOUNTER — Ambulatory Visit: Payer: Medicare HMO

## 2022-07-30 DIAGNOSIS — R262 Difficulty in walking, not elsewhere classified: Secondary | ICD-10-CM

## 2022-07-30 DIAGNOSIS — M5459 Other low back pain: Secondary | ICD-10-CM

## 2022-07-30 DIAGNOSIS — R2681 Unsteadiness on feet: Secondary | ICD-10-CM

## 2022-07-30 DIAGNOSIS — M6281 Muscle weakness (generalized): Secondary | ICD-10-CM
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
## 2022-07-31 DIAGNOSIS — Z9621 Cochlear implant status: Secondary | ICD-10-CM | POA: Diagnosis not present

## 2022-07-31 DIAGNOSIS — Z77122 Contact with and (suspected) exposure to noise: Secondary | ICD-10-CM | POA: Diagnosis not present

## 2022-07-31 DIAGNOSIS — Z4889 Encounter for other specified surgical aftercare: Secondary | ICD-10-CM | POA: Diagnosis not present

## 2022-07-31 DIAGNOSIS — H903 Sensorineural hearing loss, bilateral: Secondary | ICD-10-CM | POA: Diagnosis not present

## 2022-07-31 DIAGNOSIS — H9312 Tinnitus, left ear: Secondary | ICD-10-CM | POA: Diagnosis not present

## 2022-07-31 DIAGNOSIS — H90A22 Sensorineural hearing loss, unilateral, left ear, with restricted hearing on the contralateral side: Secondary | ICD-10-CM | POA: Diagnosis not present

## 2022-08-04 ENCOUNTER — Ambulatory Visit: Payer: Medicare HMO | Admitting: Physical Therapy

## 2022-08-04 ENCOUNTER — Encounter: Payer: Self-pay | Admitting: Physical Therapy

## 2022-08-04 DIAGNOSIS — R262 Difficulty in walking, not elsewhere classified: Secondary | ICD-10-CM

## 2022-08-04 DIAGNOSIS — M5459 Other low back pain: Secondary | ICD-10-CM

## 2022-08-04 DIAGNOSIS — R2681 Unsteadiness on feet: Secondary | ICD-10-CM | POA: Diagnosis not present

## 2022-08-04 DIAGNOSIS — M6281 Muscle weakness (generalized): Secondary | ICD-10-CM | POA: Diagnosis not present
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
## 2022-08-06 ENCOUNTER — Encounter: Payer: Medicare HMO | Admitting: Physical Therapy

## 2022-08-10 DIAGNOSIS — R600 Localized edema: Secondary | ICD-10-CM | POA: Diagnosis not present

## 2022-08-13 ENCOUNTER — Ambulatory Visit: Payer: Medicare HMO | Admitting: Physical Therapy

## 2022-08-13 ENCOUNTER — Encounter: Payer: Self-pay | Admitting: Physical Therapy

## 2022-08-13 DIAGNOSIS — H43813 Vitreous degeneration, bilateral: Secondary | ICD-10-CM | POA: Diagnosis not present

## 2022-08-13 DIAGNOSIS — M5459 Other low back pain: Secondary | ICD-10-CM | POA: Diagnosis not present

## 2022-08-13 DIAGNOSIS — R2681 Unsteadiness on feet: Secondary | ICD-10-CM

## 2022-08-13 DIAGNOSIS — R262 Difficulty in walking, not elsewhere classified: Secondary | ICD-10-CM | POA: Diagnosis not present

## 2022-08-13 DIAGNOSIS — M6281 Muscle weakness (generalized): Secondary | ICD-10-CM | POA: Diagnosis not present

## 2022-08-13 DIAGNOSIS — H21511 Anterior synechiae (iris), right eye: Secondary | ICD-10-CM | POA: Diagnosis not present

## 2022-08-13 DIAGNOSIS — Z0100 Encounter for examination of eyes and vision without abnormal findings: Secondary | ICD-10-CM | POA: Diagnosis not present

## 2022-08-13 DIAGNOSIS — H52223 Regular astigmatism, bilateral: Secondary | ICD-10-CM | POA: Diagnosis not present

## 2022-08-13 DIAGNOSIS — H04123 Dry eye syndrome of bilateral lacrimal glands: Secondary | ICD-10-CM | POA: Diagnosis not present

## 2022-08-13 DIAGNOSIS — E119 Type 2 diabetes mellitus without complications: Secondary | ICD-10-CM | POA: Diagnosis not present

## 2022-08-13 DIAGNOSIS — H5213 Myopia, bilateral: Secondary | ICD-10-CM | POA: Diagnosis not present

## 2022-08-13 DIAGNOSIS — H524 Presbyopia: Secondary | ICD-10-CM | POA: Diagnosis not present

## 2022-08-13 LAB — HM DIABETES EYE EXAM
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
## 2022-08-17 ENCOUNTER — Ambulatory Visit: Payer: Medicare HMO | Attending: Cardiology

## 2022-08-17 DIAGNOSIS — I77810 Thoracic aortic ectasia: Secondary | ICD-10-CM

## 2022-08-17 DIAGNOSIS — Z01812 Encounter for preprocedural laboratory examination: Secondary | ICD-10-CM | POA: Diagnosis not present

## 2022-08-18 ENCOUNTER — Ambulatory Visit: Payer: Medicare HMO

## 2022-08-18 DIAGNOSIS — M5459 Other low back pain: Secondary | ICD-10-CM

## 2022-08-18 DIAGNOSIS — R2681 Unsteadiness on feet: Secondary | ICD-10-CM

## 2022-08-18 DIAGNOSIS — R262 Difficulty in walking, not elsewhere classified: Secondary | ICD-10-CM | POA: Diagnosis not present

## 2022-08-18 DIAGNOSIS — M6281 Muscle weakness (generalized): Secondary | ICD-10-CM | POA: Diagnosis not present

## 2022-08-18 LAB — BASIC METABOLIC PANEL
BUN/Creatinine Ratio: 24 (ref 10–24)
BUN: 24 mg/dL (ref 8–27)
CO2: 21 mmol/L (ref 20–29)
Calcium: 10 mg/dL (ref 8.6–10.2)
Chloride: 100 mmol/L (ref 96–106)
Creatinine, Ser: 0.99 mg/dL (ref 0.76–1.27)
Glucose: 102 mg/dL — ABNORMAL HIGH (ref 70–99)
Potassium: 4.9 mmol/L (ref 3.5–5.2)
Sodium: 139 mmol/L (ref 134–144)
eGFR: 78 mL/min/{1.73_m2} (ref >59)
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
## 2022-08-19 DIAGNOSIS — R350 Frequency of micturition: Secondary | ICD-10-CM | POA: Diagnosis not present

## 2022-08-19 DIAGNOSIS — N401 Enlarged prostate with lower urinary tract symptoms: Secondary | ICD-10-CM | POA: Diagnosis not present

## 2022-08-19 DIAGNOSIS — R3915 Urgency of urination: Secondary | ICD-10-CM | POA: Diagnosis not present

## 2022-08-19 DIAGNOSIS — C61 Malignant neoplasm of prostate: Secondary | ICD-10-CM | POA: Diagnosis not present

## 2022-08-20 DIAGNOSIS — H9190 Unspecified hearing loss, unspecified ear: Secondary | ICD-10-CM | POA: Diagnosis not present

## 2022-08-20 DIAGNOSIS — Z09 Encounter for follow-up examination after completed treatment for conditions other than malignant neoplasm: Secondary | ICD-10-CM | POA: Diagnosis not present

## 2022-08-20 DIAGNOSIS — Z9621 Cochlear implant status: Secondary | ICD-10-CM | POA: Diagnosis not present

## 2022-08-20 DIAGNOSIS — H6122 Impacted cerumen, left ear: Secondary | ICD-10-CM | POA: Diagnosis not present

## 2022-08-20 DIAGNOSIS — H938X2 Other specified disorders of left ear: Secondary | ICD-10-CM | POA: Diagnosis not present

## 2022-08-20 DIAGNOSIS — Z45321 Encounter for adjustment and management of cochlear device: Secondary | ICD-10-CM | POA: Diagnosis not present

## 2022-08-21 ENCOUNTER — Ambulatory Visit (HOSPITAL_BASED_OUTPATIENT_CLINIC_OR_DEPARTMENT_OTHER)
Admission: RE | Admit: 2022-08-21 | Discharge: 2022-08-21 | Disposition: A | Discharge status: Home / Self Care | Payer: Medicare HMO | Source: Ambulatory Visit | Attending: Physician Assistant | Admitting: Physician Assistant

## 2022-08-21 ENCOUNTER — Inpatient Hospital Stay: Admission: RE | Admit: 2022-08-21 | Payer: Medicare HMO | Source: Ambulatory Visit

## 2022-08-21 DIAGNOSIS — I77810 Thoracic aortic ectasia: Secondary | ICD-10-CM | POA: Diagnosis not present

## 2022-08-21 DIAGNOSIS — I2510 Atherosclerotic heart disease of native coronary artery without angina pectoris: Secondary | ICD-10-CM | POA: Diagnosis not present

## 2022-08-21 MED ORDER — IOHEXOL 350 MG/ML SOLN
100.0000 mL | Freq: Once | INTRAVENOUS | Status: AC | PRN
  Administered 2022-08-21: 80 mL via INTRAVENOUS

## 2022-08-25 ENCOUNTER — Ambulatory Visit: Payer: Medicare HMO

## 2022-08-25 DIAGNOSIS — R2681 Unsteadiness on feet: Secondary | ICD-10-CM

## 2022-08-25 DIAGNOSIS — R262 Difficulty in walking, not elsewhere classified: Secondary | ICD-10-CM

## 2022-08-25 DIAGNOSIS — M6281 Muscle weakness (generalized): Secondary | ICD-10-CM

## 2022-08-25 DIAGNOSIS — M5459 Other low back pain: Secondary | ICD-10-CM | POA: Diagnosis not present
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
## 2022-08-27 ENCOUNTER — Ambulatory Visit: Payer: Medicare HMO | Attending: Internal Medicine

## 2022-08-27 DIAGNOSIS — M5459 Other low back pain: Secondary | ICD-10-CM | POA: Diagnosis not present

## 2022-08-27 DIAGNOSIS — R2681 Unsteadiness on feet: Secondary | ICD-10-CM

## 2022-08-27 DIAGNOSIS — M6281 Muscle weakness (generalized): Secondary | ICD-10-CM

## 2022-08-27 DIAGNOSIS — R262 Difficulty in walking, not elsewhere classified: Secondary | ICD-10-CM | POA: Diagnosis not present
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
## 2022-08-31 ENCOUNTER — Other Ambulatory Visit: Payer: Self-pay | Admitting: Internal Medicine

## 2022-09-01 ENCOUNTER — Ambulatory Visit: Payer: Medicare HMO | Admitting: Physical Therapy

## 2022-09-01 ENCOUNTER — Encounter: Payer: Self-pay | Admitting: Physical Therapy

## 2022-09-01 ENCOUNTER — Other Ambulatory Visit: Payer: Self-pay

## 2022-09-01 DIAGNOSIS — R2681 Unsteadiness on feet: Secondary | ICD-10-CM | POA: Diagnosis not present

## 2022-09-01 DIAGNOSIS — M6281 Muscle weakness (generalized): Secondary | ICD-10-CM

## 2022-09-01 DIAGNOSIS — M5459 Other low back pain: Secondary | ICD-10-CM

## 2022-09-01 DIAGNOSIS — R262 Difficulty in walking, not elsewhere classified: Secondary | ICD-10-CM

## 2022-09-01 DIAGNOSIS — G20C Parkinsonism, unspecified: Secondary | ICD-10-CM

## 2022-09-01 MED ORDER — PIOGLITAZONE HCL 30 MG PO TABS: ORAL_TABLET | ORAL | 1 refills | Status: DC

## 2022-09-01 NOTE — Addendum Note (Signed)
Addended by: Thelma Barge D on: 09/01/2022 08:01 AM   Modules accepted: Orders
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
## 2022-09-03 ENCOUNTER — Ambulatory Visit: Payer: Medicare HMO

## 2022-09-03 DIAGNOSIS — R2681 Unsteadiness on feet: Secondary | ICD-10-CM

## 2022-09-03 DIAGNOSIS — M6281 Muscle weakness (generalized): Secondary | ICD-10-CM | POA: Diagnosis not present

## 2022-09-03 DIAGNOSIS — R262 Difficulty in walking, not elsewhere classified: Secondary | ICD-10-CM

## 2022-09-03 DIAGNOSIS — M5459 Other low back pain: Secondary | ICD-10-CM | POA: Diagnosis not present

## 2022-09-03 DIAGNOSIS — Z45321 Encounter for adjustment and management of cochlear device: Secondary | ICD-10-CM | POA: Diagnosis not present

## 2022-09-03 DIAGNOSIS — H9190 Unspecified hearing loss, unspecified ear: Secondary | ICD-10-CM | POA: Diagnosis not present
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
## 2022-09-08 ENCOUNTER — Ambulatory Visit: Payer: Medicare HMO

## 2022-09-08 DIAGNOSIS — R262 Difficulty in walking, not elsewhere classified: Secondary | ICD-10-CM | POA: Diagnosis not present

## 2022-09-08 DIAGNOSIS — R2681 Unsteadiness on feet: Secondary | ICD-10-CM

## 2022-09-08 DIAGNOSIS — M5459 Other low back pain: Secondary | ICD-10-CM | POA: Diagnosis not present

## 2022-09-08 DIAGNOSIS — M6281 Muscle weakness (generalized): Secondary | ICD-10-CM
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
## 2022-09-10 ENCOUNTER — Ambulatory Visit: Payer: Medicare HMO | Admitting: Physical Therapy

## 2022-09-10 ENCOUNTER — Encounter: Payer: Self-pay | Admitting: Physical Therapy

## 2022-09-10 DIAGNOSIS — R262 Difficulty in walking, not elsewhere classified: Secondary | ICD-10-CM

## 2022-09-10 DIAGNOSIS — M5459 Other low back pain: Secondary | ICD-10-CM | POA: Diagnosis not present

## 2022-09-10 DIAGNOSIS — R2681 Unsteadiness on feet: Secondary | ICD-10-CM

## 2022-09-10 DIAGNOSIS — M6281 Muscle weakness (generalized): Secondary | ICD-10-CM | POA: Diagnosis not present
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
## 2022-09-11 ENCOUNTER — Other Ambulatory Visit: Payer: Self-pay | Admitting: Internal Medicine
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
## 2022-09-29 ENCOUNTER — Ambulatory Visit: Payer: Medicare HMO | Attending: Internal Medicine | Admitting: Physical Therapy

## 2022-09-29 ENCOUNTER — Encounter: Payer: Self-pay | Admitting: Physical Therapy

## 2022-09-29 ENCOUNTER — Other Ambulatory Visit: Payer: Self-pay

## 2022-09-29 DIAGNOSIS — G20C Parkinsonism, unspecified: Secondary | ICD-10-CM | POA: Diagnosis not present

## 2022-09-29 DIAGNOSIS — M5459 Other low back pain: Secondary | ICD-10-CM | POA: Diagnosis not present

## 2022-09-29 DIAGNOSIS — M6281 Muscle weakness (generalized): Secondary | ICD-10-CM | POA: Diagnosis not present

## 2022-09-29 DIAGNOSIS — R2681 Unsteadiness on feet: Secondary | ICD-10-CM | POA: Diagnosis not present

## 2022-09-29 DIAGNOSIS — R2689 Other abnormalities of gait and mobility: Secondary | ICD-10-CM

## 2022-09-29 DIAGNOSIS — R262 Difficulty in walking, not elsewhere classified: Secondary | ICD-10-CM | POA: Insufficient documentation

## 2022-10-01 ENCOUNTER — Ambulatory Visit: Payer: Medicare HMO | Admitting: Physical Therapy

## 2022-10-01 ENCOUNTER — Encounter: Payer: Self-pay | Admitting: Physical Therapy

## 2022-10-01 DIAGNOSIS — R2689 Other abnormalities of gait and mobility: Secondary | ICD-10-CM

## 2022-10-01 DIAGNOSIS — R2681 Unsteadiness on feet: Secondary | ICD-10-CM

## 2022-10-01 DIAGNOSIS — G20C Parkinsonism, unspecified: Secondary | ICD-10-CM | POA: Diagnosis not present

## 2022-10-01 DIAGNOSIS — M5459 Other low back pain: Secondary | ICD-10-CM | POA: Diagnosis not present

## 2022-10-01 DIAGNOSIS — R262 Difficulty in walking, not elsewhere classified: Secondary | ICD-10-CM | POA: Diagnosis not present

## 2022-10-01 DIAGNOSIS — M6281 Muscle weakness (generalized): Secondary | ICD-10-CM | POA: Diagnosis not present
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
## 2022-10-06 ENCOUNTER — Ambulatory Visit: Payer: Medicare HMO | Admitting: Physical Therapy

## 2022-10-06 ENCOUNTER — Encounter: Payer: Self-pay | Admitting: Physical Therapy

## 2022-10-06 DIAGNOSIS — R262 Difficulty in walking, not elsewhere classified: Secondary | ICD-10-CM | POA: Diagnosis not present

## 2022-10-06 DIAGNOSIS — R2681 Unsteadiness on feet: Secondary | ICD-10-CM

## 2022-10-06 DIAGNOSIS — M6281 Muscle weakness (generalized): Secondary | ICD-10-CM

## 2022-10-06 DIAGNOSIS — R2689 Other abnormalities of gait and mobility: Secondary | ICD-10-CM | POA: Diagnosis not present

## 2022-10-06 DIAGNOSIS — G20C Parkinsonism, unspecified: Secondary | ICD-10-CM | POA: Diagnosis not present

## 2022-10-06 DIAGNOSIS — M5459 Other low back pain: Secondary | ICD-10-CM | POA: Diagnosis not present
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
## 2022-10-08 ENCOUNTER — Encounter: Payer: Self-pay | Admitting: Physical Therapy

## 2022-10-08 ENCOUNTER — Ambulatory Visit: Payer: Medicare HMO | Admitting: Physical Therapy

## 2022-10-08 DIAGNOSIS — R2681 Unsteadiness on feet: Secondary | ICD-10-CM

## 2022-10-08 DIAGNOSIS — R2689 Other abnormalities of gait and mobility: Secondary | ICD-10-CM

## 2022-10-08 DIAGNOSIS — M6281 Muscle weakness (generalized): Secondary | ICD-10-CM | POA: Diagnosis not present

## 2022-10-08 DIAGNOSIS — G20C Parkinsonism, unspecified: Secondary | ICD-10-CM | POA: Diagnosis not present

## 2022-10-08 DIAGNOSIS — R262 Difficulty in walking, not elsewhere classified: Secondary | ICD-10-CM | POA: Diagnosis not present

## 2022-10-08 DIAGNOSIS — M5459 Other low back pain: Secondary | ICD-10-CM | POA: Diagnosis not present
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
## 2022-10-13 ENCOUNTER — Ambulatory Visit: Payer: Medicare HMO | Admitting: Physical Therapy

## 2022-10-13 ENCOUNTER — Encounter: Payer: Self-pay | Admitting: Physical Therapy

## 2022-10-13 DIAGNOSIS — R2689 Other abnormalities of gait and mobility: Secondary | ICD-10-CM

## 2022-10-13 DIAGNOSIS — R2681 Unsteadiness on feet: Secondary | ICD-10-CM

## 2022-10-13 DIAGNOSIS — M6281 Muscle weakness (generalized): Secondary | ICD-10-CM

## 2022-10-13 DIAGNOSIS — R262 Difficulty in walking, not elsewhere classified: Secondary | ICD-10-CM

## 2022-10-13 DIAGNOSIS — M5459 Other low back pain: Secondary | ICD-10-CM | POA: Diagnosis not present

## 2022-10-13 DIAGNOSIS — F341 Dysthymic disorder: Secondary | ICD-10-CM | POA: Diagnosis not present

## 2022-10-13 DIAGNOSIS — G20C Parkinsonism, unspecified: Secondary | ICD-10-CM | POA: Diagnosis not present
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
## 2022-10-15 ENCOUNTER — Encounter: Payer: Self-pay | Admitting: Physical Therapy

## 2022-10-15 ENCOUNTER — Ambulatory Visit: Payer: Medicare HMO | Admitting: Physical Therapy

## 2022-10-15 DIAGNOSIS — R2689 Other abnormalities of gait and mobility: Secondary | ICD-10-CM

## 2022-10-15 DIAGNOSIS — G20C Parkinsonism, unspecified: Secondary | ICD-10-CM | POA: Diagnosis not present

## 2022-10-15 DIAGNOSIS — M6281 Muscle weakness (generalized): Secondary | ICD-10-CM

## 2022-10-15 DIAGNOSIS — M5459 Other low back pain: Secondary | ICD-10-CM | POA: Diagnosis not present

## 2022-10-15 DIAGNOSIS — R2681 Unsteadiness on feet: Secondary | ICD-10-CM

## 2022-10-15 DIAGNOSIS — R262 Difficulty in walking, not elsewhere classified: Secondary | ICD-10-CM | POA: Diagnosis not present
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
## 2022-10-20 ENCOUNTER — Encounter: Payer: Self-pay | Admitting: Physical Therapy

## 2022-10-20 ENCOUNTER — Ambulatory Visit: Payer: Medicare HMO | Admitting: Physical Therapy

## 2022-10-20 DIAGNOSIS — M6281 Muscle weakness (generalized): Secondary | ICD-10-CM | POA: Diagnosis not present

## 2022-10-20 DIAGNOSIS — R262 Difficulty in walking, not elsewhere classified: Secondary | ICD-10-CM

## 2022-10-20 DIAGNOSIS — R2681 Unsteadiness on feet: Secondary | ICD-10-CM | POA: Diagnosis not present

## 2022-10-20 DIAGNOSIS — M5459 Other low back pain: Secondary | ICD-10-CM | POA: Diagnosis not present

## 2022-10-20 DIAGNOSIS — G20C Parkinsonism, unspecified: Secondary | ICD-10-CM | POA: Diagnosis not present

## 2022-10-20 DIAGNOSIS — R2689 Other abnormalities of gait and mobility: Secondary | ICD-10-CM
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
## 2022-10-21 DIAGNOSIS — H9193 Unspecified hearing loss, bilateral: Secondary | ICD-10-CM | POA: Diagnosis not present

## 2022-10-21 DIAGNOSIS — Z45321 Encounter for adjustment and management of cochlear device: Secondary | ICD-10-CM | POA: Diagnosis not present

## 2022-10-21 DIAGNOSIS — H903 Sensorineural hearing loss, bilateral: Secondary | ICD-10-CM | POA: Diagnosis not present

## 2022-10-22 ENCOUNTER — Ambulatory Visit: Payer: Medicare HMO | Admitting: Neurology

## 2022-10-22 ENCOUNTER — Encounter: Payer: Self-pay | Admitting: Neurology

## 2022-10-22 VITALS — BP 135/76 | HR 76 | Ht 71.0 in | Wt 205.1 lb

## 2022-10-22 DIAGNOSIS — G629 Polyneuropathy, unspecified: Secondary | ICD-10-CM

## 2022-10-22 DIAGNOSIS — Z8673 Personal history of transient ischemic attack (TIA), and cerebral infarction without residual deficits: Secondary | ICD-10-CM | POA: Diagnosis not present

## 2022-10-22 DIAGNOSIS — R251 Tremor, unspecified: Secondary | ICD-10-CM

## 2022-10-22 MED ORDER — GABAPENTIN 300 MG PO CAPS: 300.0000 mg | ORAL_CAPSULE | Freq: Three times a day (TID) | ORAL | 3 refills | Status: DC

## 2022-10-22 NOTE — Patient Instructions (Addendum)
I had a long discussion with the patient regarding his tremor working for exam which appears quite well-controlled on the current regimen of Sinemet 25/100 1 tablet 3 times daily which we will continue.  Increase dose of gabapentin to 300 mg 3 times daily to help with his worsening hand paresthesias.  Continue aspirin 81 mg daily for stroke prevention and maintain adequate control of stroke risk factors including HTN with BP goal<130/90 and HLD with LDL goal<70.  Check screening carotid ultrasound study.  Return for follow-up in the future with [facility] nurse practitioner in 6 months or call earlier if necessary.
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
## 2022-10-23 ENCOUNTER — Encounter: Payer: Self-pay | Admitting: Physical Therapy

## 2022-10-23 ENCOUNTER — Ambulatory Visit: Payer: Medicare HMO | Admitting: Physical Therapy

## 2022-10-23 DIAGNOSIS — R2689 Other abnormalities of gait and mobility: Secondary | ICD-10-CM

## 2022-10-23 DIAGNOSIS — G20C Parkinsonism, unspecified: Secondary | ICD-10-CM | POA: Diagnosis not present

## 2022-10-23 DIAGNOSIS — R2681 Unsteadiness on feet: Secondary | ICD-10-CM | POA: Diagnosis not present

## 2022-10-23 DIAGNOSIS — R262 Difficulty in walking, not elsewhere classified: Secondary | ICD-10-CM | POA: Diagnosis not present

## 2022-10-23 DIAGNOSIS — M6281 Muscle weakness (generalized): Secondary | ICD-10-CM | POA: Diagnosis not present

## 2022-10-23 DIAGNOSIS — M5459 Other low back pain: Secondary | ICD-10-CM | POA: Diagnosis not present
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
## 2022-10-27 ENCOUNTER — Encounter: Payer: Self-pay | Admitting: Physical Therapy

## 2022-10-27 ENCOUNTER — Ambulatory Visit: Payer: Medicare HMO | Attending: Internal Medicine | Admitting: Physical Therapy

## 2022-10-27 DIAGNOSIS — R2689 Other abnormalities of gait and mobility: Secondary | ICD-10-CM | POA: Diagnosis not present

## 2022-10-27 DIAGNOSIS — M6281 Muscle weakness (generalized): Secondary | ICD-10-CM | POA: Insufficient documentation

## 2022-10-27 DIAGNOSIS — M5459 Other low back pain: Secondary | ICD-10-CM | POA: Insufficient documentation

## 2022-10-27 DIAGNOSIS — R262 Difficulty in walking, not elsewhere classified: Secondary | ICD-10-CM | POA: Diagnosis not present

## 2022-10-27 DIAGNOSIS — R2681 Unsteadiness on feet: Secondary | ICD-10-CM | POA: Diagnosis not present
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
## 2022-10-31 ENCOUNTER — Other Ambulatory Visit: Payer: Self-pay | Admitting: Internal Medicine

## 2022-11-04 ENCOUNTER — Ambulatory Visit: Payer: Medicare HMO | Admitting: Physical Therapy

## 2022-11-04 ENCOUNTER — Encounter: Payer: Self-pay | Admitting: Physical Therapy

## 2022-11-04 DIAGNOSIS — R262 Difficulty in walking, not elsewhere classified: Secondary | ICD-10-CM | POA: Diagnosis not present

## 2022-11-04 DIAGNOSIS — R2689 Other abnormalities of gait and mobility: Secondary | ICD-10-CM | POA: Diagnosis not present

## 2022-11-04 DIAGNOSIS — M6281 Muscle weakness (generalized): Secondary | ICD-10-CM

## 2022-11-04 DIAGNOSIS — R2681 Unsteadiness on feet: Secondary | ICD-10-CM | POA: Diagnosis not present

## 2022-11-04 DIAGNOSIS — M5459 Other low back pain: Secondary | ICD-10-CM | POA: Diagnosis not present
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
## 2022-11-10 ENCOUNTER — Ambulatory Visit: Payer: Medicare HMO

## 2022-11-10 DIAGNOSIS — R2689 Other abnormalities of gait and mobility: Secondary | ICD-10-CM | POA: Diagnosis not present

## 2022-11-10 DIAGNOSIS — M5459 Other low back pain: Secondary | ICD-10-CM

## 2022-11-10 DIAGNOSIS — R262 Difficulty in walking, not elsewhere classified: Secondary | ICD-10-CM

## 2022-11-10 DIAGNOSIS — R2681 Unsteadiness on feet: Secondary | ICD-10-CM | POA: Diagnosis not present

## 2022-11-10 DIAGNOSIS — M6281 Muscle weakness (generalized): Secondary | ICD-10-CM
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
## 2022-11-13 ENCOUNTER — Ambulatory Visit: Payer: Medicare HMO | Admitting: Physical Therapy

## 2022-11-16 ENCOUNTER — Other Ambulatory Visit: Payer: Self-pay | Admitting: Internal Medicine

## 2022-11-17 ENCOUNTER — Ambulatory Visit: Payer: Medicare HMO | Admitting: Physical Therapy

## 2022-11-17 ENCOUNTER — Encounter: Payer: Self-pay | Admitting: Physical Therapy

## 2022-11-17 DIAGNOSIS — R2689 Other abnormalities of gait and mobility: Secondary | ICD-10-CM

## 2022-11-17 DIAGNOSIS — R2681 Unsteadiness on feet: Secondary | ICD-10-CM

## 2022-11-17 DIAGNOSIS — M5459 Other low back pain: Secondary | ICD-10-CM | POA: Diagnosis not present

## 2022-11-17 DIAGNOSIS — M6281 Muscle weakness (generalized): Secondary | ICD-10-CM

## 2022-11-17 DIAGNOSIS — R262 Difficulty in walking, not elsewhere classified: Secondary | ICD-10-CM
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
## 2022-11-18 ENCOUNTER — Ambulatory Visit (HOSPITAL_COMMUNITY)
Admission: RE | Admit: 2022-11-18 | Discharge: 2022-11-18 | Disposition: A | Discharge status: Home / Self Care | Payer: Medicare HMO | Source: Ambulatory Visit | Attending: Neurology | Admitting: Neurology

## 2022-11-18 DIAGNOSIS — I6523 Occlusion and stenosis of bilateral carotid arteries: Secondary | ICD-10-CM | POA: Diagnosis not present

## 2022-11-18 DIAGNOSIS — G629 Polyneuropathy, unspecified: Secondary | ICD-10-CM | POA: Diagnosis not present

## 2022-11-18 NOTE — Progress Notes (Signed)
Carotid artery duplex has been completed. Preliminary results can be found in CV Proc through chart review.   11/18/22 8:23 AM Olen Cordial RVT

## 2022-11-20 ENCOUNTER — Ambulatory Visit: Payer: Medicare HMO | Admitting: Physical Therapy

## 2022-11-20 ENCOUNTER — Encounter: Payer: Self-pay | Admitting: Physical Therapy

## 2022-11-20 DIAGNOSIS — M5459 Other low back pain: Secondary | ICD-10-CM | POA: Diagnosis not present

## 2022-11-20 DIAGNOSIS — R2681 Unsteadiness on feet: Secondary | ICD-10-CM | POA: Diagnosis not present

## 2022-11-20 DIAGNOSIS — R2689 Other abnormalities of gait and mobility: Secondary | ICD-10-CM

## 2022-11-20 DIAGNOSIS — R262 Difficulty in walking, not elsewhere classified: Secondary | ICD-10-CM

## 2022-11-20 DIAGNOSIS — M6281 Muscle weakness (generalized): Secondary | ICD-10-CM | POA: Diagnosis not present
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
## 2022-11-22 NOTE — Progress Notes (Signed)
Kindly inform the patient that carotid ultrasound studies suggest no significant narrowing of either carotid artery in the neck.

## 2022-11-23 ENCOUNTER — Telehealth: Payer: Self-pay | Admitting: Anesthesiology

## 2022-11-23 NOTE — Telephone Encounter (Signed)
-----   Message from Delia Heady sent at 11/22/2022  4:28 PM EDT ----- Joneen Roach inform the patient that carotid ultrasound studies suggest no significant narrowing of either carotid artery in the neck.

## 2022-11-24 ENCOUNTER — Encounter: Payer: Self-pay | Admitting: Physical Therapy

## 2022-11-24 ENCOUNTER — Ambulatory Visit: Payer: Medicare HMO | Admitting: Physical Therapy

## 2022-11-24 DIAGNOSIS — R262 Difficulty in walking, not elsewhere classified: Secondary | ICD-10-CM

## 2022-11-24 DIAGNOSIS — M5459 Other low back pain: Secondary | ICD-10-CM

## 2022-11-24 DIAGNOSIS — R2681 Unsteadiness on feet: Secondary | ICD-10-CM | POA: Diagnosis not present

## 2022-11-24 DIAGNOSIS — R2689 Other abnormalities of gait and mobility: Secondary | ICD-10-CM

## 2022-11-24 DIAGNOSIS — M6281 Muscle weakness (generalized): Secondary | ICD-10-CM | POA: Diagnosis not present
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
## 2022-11-26 ENCOUNTER — Ambulatory Visit: Payer: Medicare HMO | Attending: Internal Medicine | Admitting: Physical Therapy

## 2022-11-26 ENCOUNTER — Encounter: Payer: Self-pay | Admitting: Internal Medicine

## 2022-11-26 ENCOUNTER — Encounter: Payer: Self-pay | Admitting: Physical Therapy

## 2022-11-26 DIAGNOSIS — M6281 Muscle weakness (generalized): Secondary | ICD-10-CM | POA: Diagnosis present

## 2022-11-26 DIAGNOSIS — R262 Difficulty in walking, not elsewhere classified: Secondary | ICD-10-CM | POA: Diagnosis present

## 2022-11-26 DIAGNOSIS — R2689 Other abnormalities of gait and mobility: Secondary | ICD-10-CM | POA: Diagnosis present

## 2022-11-26 DIAGNOSIS — R2681 Unsteadiness on feet: Secondary | ICD-10-CM | POA: Diagnosis present
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
## 2022-11-27 ENCOUNTER — Ambulatory Visit: Payer: Medicare HMO | Admitting: *Deleted

## 2022-11-27 VITALS — BP 135/87 | Ht 71.0 in | Wt 205.0 lb

## 2022-11-27 DIAGNOSIS — Z0000 Encounter for general adult medical examination without abnormal findings: Secondary | ICD-10-CM | POA: Diagnosis not present
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
## 2022-11-27 NOTE — Patient Instructions (Signed)
William Norton , Thank you for taking time to come for your Medicare Wellness Visit. I appreciate your ongoing commitment to your health goals. Please review the following plan we discussed and let me know if I can assist you in the future.     This is a list of the screening recommended for you and due dates:  Health Maintenance  Topic Date Due   Eye exam for diabetics  07/17/2021   Complete foot exam   11/11/2022   Flu Shot  11/26/2022   COVID-19 Vaccine (7 - 2023-24 season) 12/23/2022*   Hemoglobin A1C  01/23/2023   Yearly kidney health urinalysis for diabetes  06/30/2023   Yearly kidney function blood test for diabetes  08/17/2023   Screening for Lung Cancer  08/21/2023   Medicare Annual Wellness Visit  11/27/2023   DTaP/Tdap/Td vaccine (3 - Td or Tdap) 10/21/2026   Pneumonia Vaccine  Completed   Hepatitis C Screening  Completed   Zoster (Shingles) Vaccine  Completed   HPV Vaccine  Aged Out   Colon Cancer Screening  Discontinued  *Topic was postponed. The date shown is not the original due date.    Next appointment: Follow up in one year for your annual wellness visit.   Preventive Care 36 Years and Older, Male Preventive care refers to lifestyle choices and visits with your health care provider that can promote health and wellness. What does preventive care include? A yearly physical exam. This is also called an annual well check. Dental exams once or twice a year. Routine eye exams. Ask your health care provider how often you should have your eyes checked. Personal lifestyle choices, including: Daily care of your teeth and gums. Regular physical activity. Eating a healthy diet. Avoiding tobacco and drug use. Limiting alcohol use. Practicing safe sex. Taking low doses of aspirin every day. Taking vitamin and mineral supplements as recommended by your health care provider. What happens during an annual well check? The services and screenings done by your health care  provider during your annual well check will depend on your age, overall health, lifestyle risk factors, and family history of disease. Counseling  Your health care provider may ask you questions about your: Alcohol use. Tobacco use. Drug use. Emotional well-being. Home and relationship well-being. Sexual activity. Eating habits. History of falls. Memory and ability to understand (cognition). Work and work Astronomer. Screening  You may have the following tests or measurements: Height, weight, and BMI. Blood pressure. Lipid and cholesterol levels. These may be checked every 5 years, or more frequently if you are over 72 years old. Skin check. Lung cancer screening. You may have this screening every year starting at age 42 if you have a 30-pack-year history of smoking and currently smoke or have quit within the past 15 years. Fecal occult blood test (FOBT) of the stool. You may have this test every year starting at age 18. Flexible sigmoidoscopy or colonoscopy. You may have a sigmoidoscopy every 5 years or a colonoscopy every 10 years starting at age 58. Prostate cancer screening. Recommendations will vary depending on your family history and other risks. Hepatitis C blood test. Hepatitis B blood test. Sexually transmitted disease (STD) testing. Diabetes screening. This is done by checking your blood sugar (glucose) after you have not eaten for a while (fasting). You may have this done every 1-3 years. Abdominal aortic aneurysm (AAA) screening. You may need this if you are a current or former smoker. Osteoporosis. You may be screened starting at  age 20 if you are at high risk. Talk with your health care provider about your test results, treatment options, and if necessary, the need for more tests. Vaccines  Your health care provider may recommend certain vaccines, such as: Influenza vaccine. This is recommended every year. Tetanus, diphtheria, and acellular pertussis (Tdap, Td)  vaccine. You may need a Td booster every 10 years. Zoster vaccine. You may need this after age 78. Pneumococcal 13-valent conjugate (PCV13) vaccine. One dose is recommended after age 85. Pneumococcal polysaccharide (PPSV23) vaccine. One dose is recommended after age 10. Talk to your health care provider about which screenings and vaccines you need and how often you need them. This information is not intended to replace advice given to you by your health care provider. Make sure you discuss any questions you have with your health care provider. Document Released: 05/10/2015 Document Revised: 01/01/2016 Document Reviewed: 02/12/2015 Elsevier Interactive Patient Education  2017 [employer].  Fall Prevention in the Home Falls can cause injuries. They can happen to people of all ages. There are many things you can do to make your home safe and to help prevent falls. What can I do on the outside of my home? Regularly fix the edges of walkways and driveways and fix any cracks. Remove anything that might make you trip as you walk through a door, such as a raised step or threshold. Trim any bushes or trees on the path to your home. Use bright outdoor lighting. Clear any walking paths of anything that might make someone trip, such as rocks or tools. Regularly check to see if handrails are loose or broken. Make sure that both sides of any steps have handrails. Any raised decks and porches should have guardrails on the edges. Have any leaves, snow, or ice cleared regularly. Use sand or salt on walking paths during winter. Clean up any spills in your garage right away. This includes oil or grease spills. What can I do in the bathroom? Use night lights. Install grab bars by the toilet and in the tub and shower. Do not use towel bars as grab bars. Use non-skid mats or decals in the tub or shower. If you need to sit down in the shower, use a plastic, non-slip stool. Keep the floor dry. Clean up any  water that spills on the floor as soon as it happens. Remove soap buildup in the tub or shower regularly. Attach bath mats securely with double-sided non-slip rug tape. Do not have throw rugs and other things on the floor that can make you trip. What can I do in the bedroom? Use night lights. Make sure that you have a light by your bed that is easy to reach. Do not use any sheets or blankets that are too big for your bed. They should not hang down onto the floor. Have a firm chair that has side arms. You can use this for support while you get dressed. Do not have throw rugs and other things on the floor that can make you trip. What can I do in the kitchen? Clean up any spills right away. Avoid walking on wet floors. Keep items that you use a lot in easy-to-reach places. If you need to reach something above you, use a strong step stool that has a grab bar. Keep electrical cords out of the way. Do not use floor polish or wax that makes floors slippery. If you must use wax, use non-skid floor wax. Do not have throw rugs and  other things on the floor that can make you trip. What can I do with my stairs? Do not leave any items on the stairs. Make sure that there are handrails on both sides of the stairs and use them. Fix handrails that are broken or loose. Make sure that handrails are as long as the stairways. Check any carpeting to make sure that it is firmly attached to the stairs. Fix any carpet that is loose or worn. Avoid having throw rugs at the top or bottom of the stairs. If you do have throw rugs, attach them to the floor with carpet tape. Make sure that you have a light switch at the top of the stairs and the bottom of the stairs. If you do not have them, ask someone to add them for you. What else can I do to help prevent falls? Wear shoes that: Do not have high heels. Have rubber bottoms. Are comfortable and fit you well. Are closed at the toe. Do not wear sandals. If you use a  stepladder: Make sure that it is fully opened. Do not climb a closed stepladder. Make sure that both sides of the stepladder are locked into place. Ask someone to hold it for you, if possible. Clearly mark and make sure that you can see: Any grab bars or handrails. First and last steps. Where the edge of each step is. Use tools that help you move around (mobility aids) if they are needed. These include: Canes. Walkers. Scooters. Crutches. Turn on the lights when you go into a dark area. Replace any light bulbs as soon as they burn out. Set up your furniture so you have a clear path. Avoid moving your furniture around. If any of your floors are uneven, fix them. If there are any pets around you, be aware of where they are. Review your medicines with your doctor. Some medicines can make you feel dizzy. This can increase your chance of falling. Ask your doctor what other things that you can do to help prevent falls. This information is not intended to replace advice given to you by your health care provider. Make sure you discuss any questions you have with your health care provider. Document Released: 02/07/2009 Document Revised: 09/19/2015 Document Reviewed: 05/18/2014 Elsevier Interactive Patient Education  2017 [employer].

## 2022-11-30 ENCOUNTER — Encounter: Payer: Self-pay | Admitting: Internal Medicine

## 2022-11-30 ENCOUNTER — Ambulatory Visit (INDEPENDENT_AMBULATORY_CARE_PROVIDER_SITE_OTHER): Payer: Medicare HMO | Admitting: Internal Medicine

## 2022-11-30 VITALS — BP 122/82 | HR 64 | Temp 97.9°F | Resp 12 | Ht 71.0 in | Wt 207.6 lb

## 2022-11-30 DIAGNOSIS — G20C Parkinsonism, unspecified: Secondary | ICD-10-CM

## 2022-11-30 DIAGNOSIS — E1159 Type 2 diabetes mellitus with other circulatory complications: Secondary | ICD-10-CM | POA: Diagnosis not present

## 2022-11-30 DIAGNOSIS — Z7984 Long term (current) use of oral hypoglycemic drugs: Secondary | ICD-10-CM | POA: Diagnosis not present

## 2022-11-30 DIAGNOSIS — Z23 Encounter for immunization: Secondary | ICD-10-CM

## 2022-11-30 DIAGNOSIS — I10 Essential (primary) hypertension: Secondary | ICD-10-CM

## 2022-11-30 DIAGNOSIS — G3184 Mild cognitive impairment, so stated: Secondary | ICD-10-CM | POA: Diagnosis not present

## 2022-11-30 LAB — CBC WITH DIFFERENTIAL/PLATELET
Basophils Absolute: 0 10*3/uL (ref 0.0–0.1)
Basophils Relative: 0.5 % (ref 0.0–3.0)
Eosinophils Absolute: 0.2 10*3/uL (ref 0.0–0.7)
Eosinophils Relative: 2.9 % (ref 0.0–5.0)
HCT: 41.2 % (ref 39.0–52.0)
Hemoglobin: 13.4 g/dL (ref 13.0–17.0)
Lymphocytes Relative: 25.8 % (ref 12.0–46.0)
Lymphs Abs: 1.7 10*3/uL (ref 0.7–4.0)
MCHC: 32.4 g/dL (ref 30.0–36.0)
MCV: 87.1 fl (ref 78.0–100.0)
Monocytes Absolute: 0.5 10*3/uL (ref 0.1–1.0)
Monocytes Relative: 7 % (ref 3.0–12.0)
Neutro Abs: 4.2 10*3/uL (ref 1.4–7.7)
Neutrophils Relative %: 63.8 % (ref 43.0–77.0)
Platelets: 289 10*3/uL (ref 150.0–400.0)
RBC: 4.73 Mil/uL (ref 4.22–5.81)
RDW: 15.1 % (ref 11.5–15.5)
WBC: 6.6 10*3/uL (ref 4.0–10.5)

## 2022-11-30 LAB — BASIC METABOLIC PANEL
BUN: 19 mg/dL (ref 6–23)
CO2: 24 mEq/L (ref 19–32)
Calcium: 9.7 mg/dL (ref 8.4–10.5)
Chloride: 100 mEq/L (ref 96–112)
Creatinine, Ser: 0.9 mg/dL (ref 0.40–1.50)
GFR: 81.93 mL/min (ref >60.00)
Glucose, Bld: 98 mg/dL (ref 70–99)
Potassium: 4.5 mEq/L (ref 3.5–5.1)
Sodium: 135 mEq/L (ref 135–145)

## 2022-11-30 LAB — HEMOGLOBIN A1C: Hgb A1c MFr Bld: 6 % (ref 4.6–6.5)

## 2022-11-30 NOTE — Assessment & Plan Note (Signed)
DM: On metformin, pioglitazone, checking A1c. HTN: On losartan, check a BMP, blood pressure today is very good, recommend to check at home. Hyperlipidemia: Controlled on atorvastatin Parkinsonian tremor, TIA, neuropathy, MCI: Saw neurology 10/22/2022, was evaluated for above, the noticed mild MCI, age-appropriate. Tremors responded to a low-dose Sinemet. Carotid ultrasounds with no significant narrowing Cochlear implant done around 06-2022: PNM 20 provided today per CDC guidelines; no other vax recommended other than age appropriate .Marland Kitchen RTC 4 months
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
## 2022-11-30 NOTE — Patient Instructions (Signed)
Vaccines I recommend: RSV COVID booster if not done recently Flu shot every fall  Check the  blood pressure regularly BP GOAL is between 110/65 and  135/85. If it is consistently higher or lower, let me know     GO TO THE LAB : Get the blood work     GO TO THE FRONT DESK, PLEASE SCHEDULE YOUR APPOINTMENTS Come back for   checkup in 4 months

## 2022-12-01 ENCOUNTER — Ambulatory Visit: Payer: Medicare HMO | Admitting: Physical Therapy

## 2022-12-01 ENCOUNTER — Encounter: Payer: Self-pay | Admitting: Physical Therapy

## 2022-12-01 DIAGNOSIS — M6281 Muscle weakness (generalized): Secondary | ICD-10-CM | POA: Diagnosis not present

## 2022-12-01 DIAGNOSIS — R262 Difficulty in walking, not elsewhere classified: Secondary | ICD-10-CM

## 2022-12-01 DIAGNOSIS — R2681 Unsteadiness on feet: Secondary | ICD-10-CM

## 2022-12-01 DIAGNOSIS — R2689 Other abnormalities of gait and mobility: Secondary | ICD-10-CM | POA: Diagnosis not present
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
## 2022-12-03 ENCOUNTER — Encounter: Payer: Medicare HMO | Admitting: Physical Therapy

## 2022-12-08 ENCOUNTER — Encounter: Payer: Self-pay | Admitting: Physical Therapy

## 2022-12-08 ENCOUNTER — Ambulatory Visit: Payer: Medicare HMO | Admitting: Physical Therapy

## 2022-12-08 DIAGNOSIS — M6281 Muscle weakness (generalized): Secondary | ICD-10-CM

## 2022-12-08 DIAGNOSIS — R262 Difficulty in walking, not elsewhere classified: Secondary | ICD-10-CM | POA: Diagnosis not present

## 2022-12-08 DIAGNOSIS — R2681 Unsteadiness on feet: Secondary | ICD-10-CM | POA: Diagnosis not present

## 2022-12-08 DIAGNOSIS — R2689 Other abnormalities of gait and mobility: Secondary | ICD-10-CM | POA: Diagnosis not present
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
## 2022-12-15 ENCOUNTER — Ambulatory Visit: Payer: Medicare HMO | Admitting: Physical Therapy

## 2022-12-15 ENCOUNTER — Encounter: Payer: Self-pay | Admitting: Physical Therapy

## 2022-12-15 DIAGNOSIS — M6281 Muscle weakness (generalized): Secondary | ICD-10-CM | POA: Diagnosis not present

## 2022-12-15 DIAGNOSIS — R2681 Unsteadiness on feet: Secondary | ICD-10-CM

## 2022-12-15 DIAGNOSIS — R262 Difficulty in walking, not elsewhere classified: Secondary | ICD-10-CM | POA: Diagnosis not present

## 2022-12-15 DIAGNOSIS — R2689 Other abnormalities of gait and mobility: Secondary | ICD-10-CM | POA: Diagnosis not present
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
## 2022-12-22 ENCOUNTER — Encounter: Payer: Self-pay | Admitting: Physical Therapy

## 2022-12-22 ENCOUNTER — Ambulatory Visit: Payer: Medicare HMO | Admitting: Physical Therapy

## 2022-12-22 DIAGNOSIS — R2689 Other abnormalities of gait and mobility: Secondary | ICD-10-CM

## 2022-12-22 DIAGNOSIS — M6281 Muscle weakness (generalized): Secondary | ICD-10-CM

## 2022-12-22 DIAGNOSIS — R2681 Unsteadiness on feet: Secondary | ICD-10-CM

## 2022-12-22 DIAGNOSIS — R262 Difficulty in walking, not elsewhere classified: Secondary | ICD-10-CM | POA: Diagnosis not present
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
## 2023-01-01 ENCOUNTER — Other Ambulatory Visit: Payer: Self-pay | Admitting: Internal Medicine

## 2023-01-12 DIAGNOSIS — F341 Dysthymic disorder: Secondary | ICD-10-CM | POA: Diagnosis not present

## 2023-01-28 DIAGNOSIS — H9193 Unspecified hearing loss, bilateral: Secondary | ICD-10-CM | POA: Diagnosis not present

## 2023-01-28 DIAGNOSIS — Z9621 Cochlear implant status: Secondary | ICD-10-CM | POA: Diagnosis not present

## 2023-01-28 DIAGNOSIS — H903 Sensorineural hearing loss, bilateral: Secondary | ICD-10-CM | POA: Diagnosis not present

## 2023-01-28 DIAGNOSIS — Z01118 Encounter for examination of ears and hearing with other abnormal findings: Secondary | ICD-10-CM | POA: Diagnosis not present

## 2023-02-02 ENCOUNTER — Other Ambulatory Visit: Payer: Self-pay | Admitting: Internal Medicine

## 2023-02-02 ENCOUNTER — Other Ambulatory Visit: Payer: Self-pay

## 2023-02-02 MED ORDER — CARBIDOPA-LEVODOPA 25-100 MG PO TABS: 1.0000 | ORAL_TABLET | Freq: Three times a day (TID) | ORAL | 3 refills | Status: DC

## 2023-02-04 DIAGNOSIS — H903 Sensorineural hearing loss, bilateral: Secondary | ICD-10-CM | POA: Diagnosis not present

## 2023-02-04 DIAGNOSIS — H90A22 Sensorineural hearing loss, unilateral, left ear, with restricted hearing on the contralateral side: Secondary | ICD-10-CM | POA: Diagnosis not present

## 2023-02-04 DIAGNOSIS — Z4889 Encounter for other specified surgical aftercare: Secondary | ICD-10-CM | POA: Diagnosis not present

## 2023-02-04 DIAGNOSIS — Z77122 Contact with and (suspected) exposure to noise: Secondary | ICD-10-CM | POA: Diagnosis not present

## 2023-02-04 DIAGNOSIS — Z9621 Cochlear implant status: Secondary | ICD-10-CM | POA: Diagnosis not present

## 2023-02-09 LAB — PSA: PSA: 5.83

## 2023-02-19 ENCOUNTER — Encounter: Payer: Self-pay | Admitting: Internal Medicine

## 2023-03-09 ENCOUNTER — Other Ambulatory Visit: Payer: Self-pay | Admitting: Internal Medicine

## 2023-03-22 ENCOUNTER — Other Ambulatory Visit: Payer: Self-pay | Admitting: Internal Medicine

## 2023-03-31 ENCOUNTER — Other Ambulatory Visit: Payer: Self-pay | Admitting: Internal Medicine

## 2023-04-02 ENCOUNTER — Ambulatory Visit (INDEPENDENT_AMBULATORY_CARE_PROVIDER_SITE_OTHER): Payer: Medicare HMO | Admitting: Internal Medicine

## 2023-04-02 ENCOUNTER — Encounter: Payer: Self-pay | Admitting: Internal Medicine

## 2023-04-02 VITALS — BP 126/80 | HR 65 | Temp 97.6°F | Resp 18 | Ht 71.0 in | Wt 222.4 lb

## 2023-04-02 DIAGNOSIS — E785 Hyperlipidemia, unspecified: Secondary | ICD-10-CM | POA: Diagnosis not present

## 2023-04-02 DIAGNOSIS — Z7984 Long term (current) use of oral hypoglycemic drugs: Secondary | ICD-10-CM | POA: Diagnosis not present

## 2023-04-02 DIAGNOSIS — I10 Essential (primary) hypertension: Secondary | ICD-10-CM | POA: Diagnosis not present

## 2023-04-02 DIAGNOSIS — Z9621 Cochlear implant status: Secondary | ICD-10-CM | POA: Diagnosis not present

## 2023-04-02 DIAGNOSIS — E1159 Type 2 diabetes mellitus with other circulatory complications: Secondary | ICD-10-CM | POA: Diagnosis not present

## 2023-04-02 DIAGNOSIS — G20C Parkinsonism, unspecified: Secondary | ICD-10-CM

## 2023-04-02 DIAGNOSIS — G3184 Mild cognitive impairment, so stated: Secondary | ICD-10-CM

## 2023-04-02 LAB — LIPID PANEL
Cholesterol: 155 mg/dL (ref 0–200)
HDL: 75 mg/dL (ref >39.00)
LDL Cholesterol: 68 mg/dL (ref 0–99)
NonHDL: 80.26
Total CHOL/HDL Ratio: 2
Triglycerides: 62 mg/dL (ref 0.0–149.0)
VLDL: 12.4 mg/dL (ref 0.0–40.0)

## 2023-04-02 LAB — HEMOGLOBIN A1C: Hgb A1c MFr Bld: 6.2 % (ref 4.6–6.5)

## 2023-04-02 LAB — MICROALBUMIN / CREATININE URINE RATIO
Creatinine,U: 47 mg/dL
Microalb Creat Ratio: 3.8 mg/g (ref 0.0–30.0)
Microalb, Ur: 1.8 mg/dL (ref 0.0–1.9)
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
## 2023-04-02 NOTE — Patient Instructions (Addendum)
  Check the  blood pressure regularly Blood pressure goal:  between 110/65 and  135/85. If it is consistently higher or lower, let me know   Diabetes blood sugar goals : You can check your sugars at different times, they right times to do it are: - early in AM fasting  ( blood sugar goal 70-130) - 2 hours after a meal (blood sugar goal less than 180)       GO TO THE LAB : Get the blood work     Next visit with me by March 2025 for a physical exam    Please schedule it at the front desk

## 2023-04-03 NOTE — Assessment & Plan Note (Signed)
DM: Ambulatory CBGs in the 110s, denies hypoglycemic symptoms, on metformin and pioglitazone.  Check A1c and micro. Declined a foot exam. CBG goals provided. Dyslipidemia: On atorvastatin, check FLP. Parkinsonian tremor, TIA, neuropathy, MCI: Patient reports Parkinson and MCI are slightly worse,  fortunately no falls, he finished a round of PT 11-2022.  Next appointment with neurology 05/03/2023. Preventive care: Had a flu and COVID-vaccine. RTC 06/2023 CPX

## 2023-04-06 DIAGNOSIS — F341 Dysthymic disorder: Secondary | ICD-10-CM | POA: Diagnosis not present

## 2023-04-12 ENCOUNTER — Other Ambulatory Visit: Payer: Self-pay | Admitting: Acute Care

## 2023-04-12 DIAGNOSIS — Z87891 Personal history of nicotine dependence: Secondary | ICD-10-CM

## 2023-04-12 DIAGNOSIS — Z122 Encounter for screening for malignant neoplasm of respiratory organs: Secondary | ICD-10-CM

## 2023-05-03 ENCOUNTER — Ambulatory Visit: Payer: Medicare HMO | Admitting: Adult Health

## 2023-05-03 ENCOUNTER — Encounter: Payer: Self-pay | Admitting: Adult Health

## 2023-05-03 VITALS — BP 131/75 | HR 100 | Ht 71.0 in | Wt 205.0 lb

## 2023-05-03 DIAGNOSIS — R2689 Other abnormalities of gait and mobility: Secondary | ICD-10-CM | POA: Diagnosis not present

## 2023-05-03 DIAGNOSIS — R42 Dizziness and giddiness: Secondary | ICD-10-CM

## 2023-05-03 DIAGNOSIS — G20C Parkinsonism, unspecified: Secondary | ICD-10-CM | POA: Diagnosis not present
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
## 2023-05-03 NOTE — Patient Instructions (Addendum)
 Your Plan:  Continue Sinemet  1 tab 3 times daily - take about every 6 hours and avoid taking with food (either 1 hour before or after eating)  Continue gabapentin  300 mg 3 times daily  You will be called to complete an CT scan of your head to ensure no vascular causes of your recent dizziness and imbalance  We will check lab work today as well to look for other causes   Ensure you are moving from seated to standing position slowly, ensure you are drinking plenty of water throughout the day. Use of compression stockings can also help.   Referral will be placed to physical therapy - they will call you to get this scheduled      Follow up in 6 months or call earlier if needed      Thank you for coming to see us  at [facility]. I hope we have been able to provide you high quality care today.  You may receive a patient satisfaction survey over the next few weeks. We would appreciate your feedback and comments so that we may continue to improve ourselves and the health of our patients.

## 2023-05-04 LAB — BASIC METABOLIC PANEL
BUN/Creatinine Ratio: 17 (ref 10–24)
BUN: 19 mg/dL (ref 8–27)
CO2: 19 mmol/L — ABNORMAL LOW (ref 20–29)
Calcium: 9.3 mg/dL (ref 8.6–10.2)
Chloride: 103 mmol/L (ref 96–106)
Creatinine, Ser: 1.1 mg/dL (ref 0.76–1.27)
Glucose: 134 mg/dL — ABNORMAL HIGH (ref 70–99)
Potassium: 4.4 mmol/L (ref 3.5–5.2)
Sodium: 139 mmol/L (ref 134–144)
eGFR: 69 mL/min/{1.73_m2} (ref >59)

## 2023-05-04 LAB — CBC WITH DIFFERENTIAL/PLATELET
Basophils Absolute: 0 10*3/uL (ref 0.0–0.2)
Basos: 0 %
EOS (ABSOLUTE): 0.2 10*3/uL (ref 0.0–0.4)
Eos: 3 %
Hematocrit: 39.6 % (ref 37.5–51.0)
Hemoglobin: 12.9 g/dL — ABNORMAL LOW (ref 13.0–17.7)
Immature Grans (Abs): 0 10*3/uL (ref 0.0–0.1)
Immature Granulocytes: 0 %
Lymphocytes Absolute: 1.5 10*3/uL (ref 0.7–3.1)
Lymphs: 19 %
MCH: 27.7 pg (ref 26.6–33.0)
MCHC: 32.6 g/dL (ref 31.5–35.7)
MCV: 85 fL (ref 79–97)
Monocytes Absolute: 0.6 10*3/uL (ref 0.1–0.9)
Monocytes: 8 %
Neutrophils Absolute: 5.4 10*3/uL (ref 1.4–7.0)
Neutrophils: 70 %
Platelets: 307 10*3/uL (ref 150–450)
RBC: 4.66 x10E6/uL (ref 4.14–5.80)
RDW: 13.5 % (ref 11.6–15.4)
WBC: 7.7 10*3/uL (ref 3.4–10.8)

## 2023-05-06 ENCOUNTER — Other Ambulatory Visit: Payer: Self-pay | Admitting: Internal Medicine

## 2023-05-06 ENCOUNTER — Encounter: Payer: Self-pay | Admitting: Adult Health

## 2023-05-10 ENCOUNTER — Ambulatory Visit: Payer: Medicare HMO | Attending: Adult Health

## 2023-05-10 DIAGNOSIS — R2689 Other abnormalities of gait and mobility: Secondary | ICD-10-CM | POA: Insufficient documentation

## 2023-05-10 DIAGNOSIS — R2681 Unsteadiness on feet: Secondary | ICD-10-CM | POA: Insufficient documentation

## 2023-05-10 DIAGNOSIS — R42 Dizziness and giddiness: Secondary | ICD-10-CM | POA: Insufficient documentation

## 2023-05-10 DIAGNOSIS — G20C Parkinsonism, unspecified: Secondary | ICD-10-CM | POA: Diagnosis not present
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
## 2023-05-11 ENCOUNTER — Other Ambulatory Visit: Payer: Self-pay

## 2023-05-19 ENCOUNTER — Encounter: Payer: Self-pay | Admitting: Physical Therapy

## 2023-05-19 ENCOUNTER — Ambulatory Visit: Payer: Medicare HMO | Admitting: Physical Therapy

## 2023-05-19 ENCOUNTER — Other Ambulatory Visit: Payer: Medicare HMO

## 2023-05-19 DIAGNOSIS — G20C Parkinsonism, unspecified: Secondary | ICD-10-CM | POA: Diagnosis not present

## 2023-05-19 DIAGNOSIS — R2681 Unsteadiness on feet: Secondary | ICD-10-CM | POA: Diagnosis not present

## 2023-05-19 DIAGNOSIS — R2689 Other abnormalities of gait and mobility: Secondary | ICD-10-CM

## 2023-05-19 DIAGNOSIS — R42 Dizziness and giddiness: Secondary | ICD-10-CM | POA: Diagnosis not present
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
## 2023-05-21 DIAGNOSIS — G20A1 Parkinson's disease without dyskinesia, without mention of fluctuations: Secondary | ICD-10-CM | POA: Diagnosis not present

## 2023-05-21 DIAGNOSIS — Z7982 Long term (current) use of aspirin: Secondary | ICD-10-CM | POA: Diagnosis not present

## 2023-05-21 DIAGNOSIS — F1021 Alcohol dependence, in remission: Secondary | ICD-10-CM | POA: Diagnosis not present

## 2023-05-21 DIAGNOSIS — E1151 Type 2 diabetes mellitus with diabetic peripheral angiopathy without gangrene: Secondary | ICD-10-CM | POA: Diagnosis not present

## 2023-05-21 DIAGNOSIS — Z8249 Family history of ischemic heart disease and other diseases of the circulatory system: Secondary | ICD-10-CM | POA: Diagnosis not present

## 2023-05-21 DIAGNOSIS — E669 Obesity, unspecified: Secondary | ICD-10-CM | POA: Diagnosis not present

## 2023-05-21 DIAGNOSIS — E1122 Type 2 diabetes mellitus with diabetic chronic kidney disease: Secondary | ICD-10-CM | POA: Diagnosis not present

## 2023-05-21 DIAGNOSIS — Z833 Family history of diabetes mellitus: Secondary | ICD-10-CM | POA: Diagnosis not present

## 2023-05-21 DIAGNOSIS — E785 Hyperlipidemia, unspecified: Secondary | ICD-10-CM | POA: Diagnosis not present

## 2023-05-21 DIAGNOSIS — F325 Major depressive disorder, single episode, in full remission: Secondary | ICD-10-CM | POA: Diagnosis not present

## 2023-05-21 DIAGNOSIS — K219 Gastro-esophageal reflux disease without esophagitis: Secondary | ICD-10-CM | POA: Diagnosis not present

## 2023-05-21 DIAGNOSIS — Z008 Encounter for other general examination: Secondary | ICD-10-CM | POA: Diagnosis not present

## 2023-05-21 DIAGNOSIS — G621 Alcoholic polyneuropathy: Secondary | ICD-10-CM | POA: Diagnosis not present
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
## 2023-05-25 ENCOUNTER — Ambulatory Visit
Admission: RE | Admit: 2023-05-25 | Discharge: 2023-05-25 | Disposition: A | Discharge status: Home / Self Care | Payer: Medicare HMO | Source: Ambulatory Visit | Attending: Adult Health | Admitting: Adult Health

## 2023-05-25 DIAGNOSIS — R42 Dizziness and giddiness: Secondary | ICD-10-CM | POA: Diagnosis not present

## 2023-05-25 DIAGNOSIS — R2689 Other abnormalities of gait and mobility: Secondary | ICD-10-CM

## 2023-05-26 ENCOUNTER — Encounter: Payer: Self-pay | Admitting: Adult Health

## 2023-05-26 ENCOUNTER — Ambulatory Visit: Payer: Medicare HMO | Admitting: Physical Therapy

## 2023-05-26 ENCOUNTER — Encounter: Payer: Self-pay | Admitting: Physical Therapy

## 2023-05-26 DIAGNOSIS — R2681 Unsteadiness on feet: Secondary | ICD-10-CM | POA: Diagnosis not present

## 2023-05-26 DIAGNOSIS — R42 Dizziness and giddiness: Secondary | ICD-10-CM | POA: Diagnosis not present

## 2023-05-26 DIAGNOSIS — R2689 Other abnormalities of gait and mobility: Secondary | ICD-10-CM

## 2023-05-26 DIAGNOSIS — G20C Parkinsonism, unspecified: Secondary | ICD-10-CM | POA: Diagnosis not present

## 2023-05-29 ENCOUNTER — Other Ambulatory Visit: Payer: Self-pay | Admitting: Internal Medicine

## 2023-06-01 DIAGNOSIS — F341 Dysthymic disorder: Secondary | ICD-10-CM | POA: Diagnosis not present

## 2023-06-02 ENCOUNTER — Encounter: Payer: Self-pay | Admitting: Physical Therapy

## 2023-06-02 ENCOUNTER — Ambulatory Visit: Payer: Medicare HMO | Attending: Adult Health | Admitting: Physical Therapy

## 2023-06-02 DIAGNOSIS — M6281 Muscle weakness (generalized): Secondary | ICD-10-CM | POA: Insufficient documentation

## 2023-06-02 DIAGNOSIS — R2681 Unsteadiness on feet: Secondary | ICD-10-CM | POA: Insufficient documentation

## 2023-06-02 DIAGNOSIS — R2689 Other abnormalities of gait and mobility: Secondary | ICD-10-CM | POA: Insufficient documentation

## 2023-06-02 DIAGNOSIS — R262 Difficulty in walking, not elsewhere classified: Secondary | ICD-10-CM | POA: Diagnosis not present
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
## 2023-06-09 ENCOUNTER — Ambulatory Visit: Payer: Medicare HMO | Admitting: Physical Therapy

## 2023-06-09 ENCOUNTER — Encounter: Payer: Self-pay | Admitting: Physical Therapy

## 2023-06-09 DIAGNOSIS — R262 Difficulty in walking, not elsewhere classified: Secondary | ICD-10-CM | POA: Diagnosis not present

## 2023-06-09 DIAGNOSIS — R2689 Other abnormalities of gait and mobility: Secondary | ICD-10-CM | POA: Diagnosis not present

## 2023-06-09 DIAGNOSIS — R2681 Unsteadiness on feet: Secondary | ICD-10-CM

## 2023-06-09 DIAGNOSIS — M6281 Muscle weakness (generalized): Secondary | ICD-10-CM | POA: Diagnosis not present
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
## 2023-06-16 ENCOUNTER — Ambulatory Visit: Payer: Medicare HMO | Admitting: Physical Therapy

## 2023-06-16 ENCOUNTER — Encounter: Payer: Self-pay | Admitting: Physical Therapy

## 2023-06-16 DIAGNOSIS — M6281 Muscle weakness (generalized): Secondary | ICD-10-CM | POA: Diagnosis not present

## 2023-06-16 DIAGNOSIS — R262 Difficulty in walking, not elsewhere classified: Secondary | ICD-10-CM | POA: Diagnosis not present

## 2023-06-16 DIAGNOSIS — R2681 Unsteadiness on feet: Secondary | ICD-10-CM | POA: Diagnosis not present

## 2023-06-16 DIAGNOSIS — R2689 Other abnormalities of gait and mobility: Secondary | ICD-10-CM
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
## 2023-06-23 ENCOUNTER — Ambulatory Visit: Payer: Medicare HMO

## 2023-06-23 DIAGNOSIS — M6281 Muscle weakness (generalized): Secondary | ICD-10-CM | POA: Diagnosis not present

## 2023-06-23 DIAGNOSIS — R2681 Unsteadiness on feet: Secondary | ICD-10-CM | POA: Diagnosis not present

## 2023-06-23 DIAGNOSIS — R262 Difficulty in walking, not elsewhere classified: Secondary | ICD-10-CM | POA: Diagnosis not present

## 2023-06-23 DIAGNOSIS — R2689 Other abnormalities of gait and mobility: Secondary | ICD-10-CM
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
## 2023-06-29 DIAGNOSIS — F341 Dysthymic disorder: Secondary | ICD-10-CM | POA: Diagnosis not present

## 2023-06-30 ENCOUNTER — Ambulatory Visit: Payer: Medicare HMO | Attending: Adult Health

## 2023-06-30 DIAGNOSIS — M5459 Other low back pain: Secondary | ICD-10-CM | POA: Insufficient documentation

## 2023-06-30 DIAGNOSIS — R2681 Unsteadiness on feet: Secondary | ICD-10-CM | POA: Insufficient documentation

## 2023-06-30 DIAGNOSIS — R262 Difficulty in walking, not elsewhere classified: Secondary | ICD-10-CM | POA: Insufficient documentation

## 2023-06-30 DIAGNOSIS — M6281 Muscle weakness (generalized): Secondary | ICD-10-CM | POA: Insufficient documentation

## 2023-06-30 DIAGNOSIS — R2689 Other abnormalities of gait and mobility: Secondary | ICD-10-CM | POA: Insufficient documentation
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
## 2023-07-01 ENCOUNTER — Other Ambulatory Visit: Payer: Self-pay | Admitting: Internal Medicine
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
## 2023-07-07 ENCOUNTER — Encounter: Payer: Self-pay | Admitting: Physical Therapy

## 2023-07-07 ENCOUNTER — Ambulatory Visit: Payer: Medicare HMO | Admitting: Physical Therapy

## 2023-07-07 ENCOUNTER — Encounter: Payer: Self-pay | Admitting: Internal Medicine

## 2023-07-07 ENCOUNTER — Ambulatory Visit (INDEPENDENT_AMBULATORY_CARE_PROVIDER_SITE_OTHER): Payer: Medicare HMO | Admitting: Internal Medicine

## 2023-07-07 VITALS — BP 124/66 | HR 66 | Temp 98.0°F | Resp 18 | Ht 71.0 in | Wt 223.4 lb

## 2023-07-07 DIAGNOSIS — R2681 Unsteadiness on feet: Secondary | ICD-10-CM

## 2023-07-07 DIAGNOSIS — R2689 Other abnormalities of gait and mobility: Secondary | ICD-10-CM | POA: Diagnosis not present

## 2023-07-07 DIAGNOSIS — M5459 Other low back pain: Secondary | ICD-10-CM | POA: Diagnosis not present

## 2023-07-07 DIAGNOSIS — G20C Parkinsonism, unspecified: Secondary | ICD-10-CM | POA: Diagnosis not present

## 2023-07-07 DIAGNOSIS — E785 Hyperlipidemia, unspecified: Secondary | ICD-10-CM

## 2023-07-07 DIAGNOSIS — Z0000 Encounter for general adult medical examination without abnormal findings: Secondary | ICD-10-CM | POA: Diagnosis not present

## 2023-07-07 DIAGNOSIS — M6281 Muscle weakness (generalized): Secondary | ICD-10-CM | POA: Diagnosis not present

## 2023-07-07 DIAGNOSIS — R262 Difficulty in walking, not elsewhere classified: Secondary | ICD-10-CM

## 2023-07-07 DIAGNOSIS — E1159 Type 2 diabetes mellitus with other circulatory complications: Secondary | ICD-10-CM | POA: Diagnosis not present

## 2023-07-07 NOTE — Patient Instructions (Addendum)
 Per our records you are soon due for your diabetic eye exam. Please contact your eye doctor to schedule an appointment. Please have them send copies of your office visit notes to [place]. Our fax number is [phone]. If you need a referral to an eye doctor please let us know.      Please go to the front desk: Arrange for a follow-up in 4 months     "Health Care Power of attorney" (Also know as a  "Living will" or  Advance care planning documents)  If you already have a living will or healthcare power of attorney, is recommended you bring the copy to be scanned in your chart.   The document will be available to all the doctors you see in the system.  If you are over 66 y/o and don't have the document, please read:  Advance care planning is a process that supports adults in  understanding and sharing their preferences regarding future medical care.  The patient's preferences are recorded in documents called Advance Directives and the can be modified at any time while the patient is in full mental capacity.     More information at: [email or web address]
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
## 2023-07-08 ENCOUNTER — Encounter: Payer: Self-pay | Admitting: Internal Medicine

## 2023-07-08 NOTE — Assessment & Plan Note (Signed)
 Here for CPX --Td 2018  -PNM 23: 2012, 2022.  PNM 13: 2015 - s/p RSV recommended -had a COVID a flu shot   --CCS: cscope 2016, Cscope wnl 2022  -- h/o Prostate cancer: PSA 5.02 February 2023 ([facility]) --Lung cancer screening: Last CT 04-2022, current age 78, not eligible  --diet, exercise: Discussed --Recent labs reviewed.  All okay. -- Healthcare POA: See AVS

## 2023-07-08 NOTE — Assessment & Plan Note (Signed)
 Here for CPX  We also discussed: DM: Last A1c 6.2.  Continue present care HTN: BP today is very good, BP normal what ever he checks it.  No change Hyperlipidemia: Last LDL 68.  At goal CAD: Denies chest pain today, continue present care. Parkinson's, TIA, neuropathy, MCI: Saw neurology  05/03/2023, at the time he reported an episode of transient confusion, MCI felt to be age-appropriate, CT head no acute. He also reports difficulty with balance, doing physical therapy. RTC 4 months
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
## 2023-07-14 ENCOUNTER — Encounter: Payer: Self-pay | Admitting: Physical Therapy

## 2023-07-14 ENCOUNTER — Ambulatory Visit: Payer: Medicare HMO | Admitting: Physical Therapy

## 2023-07-14 DIAGNOSIS — R262 Difficulty in walking, not elsewhere classified: Secondary | ICD-10-CM

## 2023-07-14 DIAGNOSIS — M6281 Muscle weakness (generalized): Secondary | ICD-10-CM

## 2023-07-14 DIAGNOSIS — R2681 Unsteadiness on feet: Secondary | ICD-10-CM | POA: Diagnosis not present

## 2023-07-14 DIAGNOSIS — R2689 Other abnormalities of gait and mobility: Secondary | ICD-10-CM

## 2023-07-14 DIAGNOSIS — M5459 Other low back pain: Secondary | ICD-10-CM

## 2023-07-21 ENCOUNTER — Encounter: Payer: Self-pay | Admitting: Physical Therapy

## 2023-07-21 ENCOUNTER — Ambulatory Visit: Payer: Medicare HMO | Admitting: Physical Therapy

## 2023-07-21 DIAGNOSIS — M6281 Muscle weakness (generalized): Secondary | ICD-10-CM | POA: Diagnosis not present

## 2023-07-21 DIAGNOSIS — R2681 Unsteadiness on feet: Secondary | ICD-10-CM | POA: Diagnosis not present

## 2023-07-21 DIAGNOSIS — R262 Difficulty in walking, not elsewhere classified: Secondary | ICD-10-CM | POA: Diagnosis not present

## 2023-07-21 DIAGNOSIS — R2689 Other abnormalities of gait and mobility: Secondary | ICD-10-CM | POA: Diagnosis not present

## 2023-07-21 DIAGNOSIS — M5459 Other low back pain: Secondary | ICD-10-CM | POA: Diagnosis not present
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
## 2023-07-28 ENCOUNTER — Ambulatory Visit: Attending: Adult Health

## 2023-07-28 ENCOUNTER — Ambulatory Visit: Payer: Medicare HMO | Admitting: Physical Therapy

## 2023-07-30 ENCOUNTER — Emergency Department (HOSPITAL_COMMUNITY)
Admission: EM | Admit: 2023-07-30 | Discharge: 2023-07-30 | Disposition: A | Discharge status: Home / Self Care | Attending: Emergency Medicine | Admitting: Emergency Medicine

## 2023-07-30 ENCOUNTER — Encounter (HOSPITAL_COMMUNITY): Payer: Self-pay

## 2023-07-30 ENCOUNTER — Other Ambulatory Visit: Payer: Self-pay

## 2023-07-30 DIAGNOSIS — Z7984 Long term (current) use of oral hypoglycemic drugs: Secondary | ICD-10-CM | POA: Insufficient documentation

## 2023-07-30 DIAGNOSIS — Z7982 Long term (current) use of aspirin: Secondary | ICD-10-CM | POA: Insufficient documentation

## 2023-07-30 DIAGNOSIS — I10 Essential (primary) hypertension: Secondary | ICD-10-CM | POA: Insufficient documentation

## 2023-07-30 DIAGNOSIS — R112 Nausea with vomiting, unspecified: Secondary | ICD-10-CM | POA: Insufficient documentation

## 2023-07-30 DIAGNOSIS — E119 Type 2 diabetes mellitus without complications: Secondary | ICD-10-CM | POA: Diagnosis not present

## 2023-07-30 DIAGNOSIS — G4700 Insomnia, unspecified: Secondary | ICD-10-CM | POA: Diagnosis not present

## 2023-07-30 DIAGNOSIS — E860 Dehydration: Secondary | ICD-10-CM | POA: Insufficient documentation

## 2023-07-30 DIAGNOSIS — G20A1 Parkinson's disease without dyskinesia, without mention of fluctuations: Secondary | ICD-10-CM | POA: Insufficient documentation

## 2023-07-30 LAB — COMPREHENSIVE METABOLIC PANEL WITH GFR
ALT: 30 U/L (ref 0–44)
AST: 29 U/L (ref 15–41)
Albumin: 3.8 g/dL (ref 3.5–5.0)
Alkaline Phosphatase: 66 U/L (ref 38–126)
Anion gap: 13 (ref 5–15)
BUN: 25 mg/dL — ABNORMAL HIGH (ref 8–23)
CO2: 21 mmol/L — ABNORMAL LOW (ref 22–32)
Calcium: 9.2 mg/dL (ref 8.9–10.3)
Chloride: 100 mmol/L (ref 98–111)
Creatinine, Ser: 0.86 mg/dL (ref 0.61–1.24)
GFR, Estimated: >60 mL/min (ref >60)
Glucose, Bld: 102 mg/dL — ABNORMAL HIGH (ref 70–99)
Potassium: 4 mmol/L (ref 3.5–5.1)
Sodium: 134 mmol/L — ABNORMAL LOW (ref 135–145)
Total Bilirubin: 1.7 mg/dL — ABNORMAL HIGH (ref 0.0–1.2)
Total Protein: 8.1 g/dL (ref 6.5–8.1)

## 2023-07-30 LAB — CBC WITH DIFFERENTIAL/PLATELET
Abs Immature Granulocytes: 0.02 10*3/uL (ref 0.00–0.07)
Basophils Absolute: 0 10*3/uL (ref 0.0–0.1)
Basophils Relative: 0 %
Eosinophils Absolute: 0.4 10*3/uL (ref 0.0–0.5)
Eosinophils Relative: 5 %
HCT: 43.7 % (ref 39.0–52.0)
Hemoglobin: 13.9 g/dL (ref 13.0–17.0)
Immature Granulocytes: 0 %
Lymphocytes Relative: 13 %
Lymphs Abs: 1 10*3/uL (ref 0.7–4.0)
MCH: 26.3 pg (ref 26.0–34.0)
MCHC: 31.8 g/dL (ref 30.0–36.0)
MCV: 82.6 fL (ref 80.0–100.0)
Monocytes Absolute: 1 10*3/uL (ref 0.1–1.0)
Monocytes Relative: 13 %
Neutro Abs: 5.3 10*3/uL (ref 1.7–7.7)
Neutrophils Relative %: 69 %
Platelets: 298 10*3/uL (ref 150–400)
RBC: 5.29 MIL/uL (ref 4.22–5.81)
RDW: 15.3 % (ref 11.5–15.5)
WBC: 7.7 10*3/uL (ref 4.0–10.5)
nRBC: 0 % (ref 0.0–0.2)

## 2023-07-30 LAB — LIPASE, BLOOD: Lipase: 26 U/L (ref 11–51)

## 2023-07-30 LAB — CBG MONITORING, ED: Glucose-Capillary: 87 mg/dL (ref 70–99)

## 2023-07-30 MED ORDER — LACTATED RINGERS IV BOLUS
1000.0000 mL | Freq: Once | INTRAVENOUS | Status: AC
  Administered 2023-07-30: 1000 mL via INTRAVENOUS

## 2023-07-30 MED ORDER — LOSARTAN POTASSIUM 50 MG PO TABS
50.0000 mg | ORAL_TABLET | Freq: Once | ORAL | Status: AC
  Administered 2023-07-30: 50 mg via ORAL
  Filled 2023-07-30: qty 1

## 2023-07-30 MED ORDER — ONDANSETRON HCL 4 MG PO TABS: 4.0000 mg | ORAL_TABLET | Freq: Four times a day (QID) | ORAL | 0 refills | Status: DC

## 2023-07-30 NOTE — Discharge Instructions (Addendum)
 You were seen in the emergency department for your dizziness and your nausea.  You did appear mildly dehydrated but no signs of injury to your kidney or other organs.  This is likely related to your weight loss medication and you should discontinue this medication.  I have given you Zofran to take as needed for nausea.  You can follow-up with your primary doctor in the next few days to have your symptoms rechecked.  I would drink plenty of fluids but would do small meals for the next few days until your symptoms improve.  You should return to the emergency department for repetitive vomiting despite the nausea medicine, or worsening dizziness and you pass out, severe abdominal pain or any other new or concerning symptoms.

## 2023-07-30 NOTE — ED Triage Notes (Addendum)
 Pt arrived reporting vomiting, weakness, dizziness since last week after taking semaglutide Denies abdominal pain, chest pain or any other symptoms.

## 2023-07-30 NOTE — ED Provider Notes (Signed)
  EMERGENCY DEPARTMENT AT [facility] Provider Note   CSN: [id number] Arrival date & time: 07/30/23  1021     History  Chief Complaint  Patient presents with   Emesis   Insomnia   Weakness    William Norton is a 78 y.o. male.  Patient is a 78 year old male with a past medical history of diabetes, Parkinson's and hypertension who presented to the emergency department with nausea and vomiting.  Patient states that last Saturday he started semaglutide for weight loss and shortly after started to develop nausea and vomiting.  He states that he has had no appetite and has not been able to keep much down.  He states that he feels very fatigued and not like himself.  He denies any associated fever, abdominal pain, diarrhea or constipation.  He denies any associated weakness to me.  The history is provided by the patient and a relative.  Emesis Insomnia  Weakness Associated symptoms: vomiting        Home Medications Prior to Admission medications   Medication Sig Start Date End Date Taking? Authorizing Provider  ondansetron (ZOFRAN) 4 MG tablet Take 1 tablet (4 mg total) by mouth every 6 (six) hours. 07/30/23  Yes Elayne Snare K, DO  Amantadine HCl 100 MG tablet Take 100 mg by mouth 2 (two) times daily. 06/02/23   [provider]  ARIPiprazole (ABILIFY) 20 MG tablet Take 20 mg by mouth daily. rx by psychiatry, exact dose unknown    [provider]  aspirin EC 81 MG tablet Take 1 tablet (81 mg total) by mouth daily. 05/24/17   Jake Bathe, MD  atorvastatin (LIPITOR) 40 MG tablet Take 1 tablet (40 mg total) by mouth daily. 07/01/23   Wanda Plump, MD  Blood Glucose Monitoring Suppl [facility] VERIO) w/Device KIT Check blood sugar twice daily 06/08/18   Wanda Plump, MD  carbidopa-levodopa (SINEMET IR) 25-100 MG tablet Take 1 tablet by mouth 3 (three) times daily. 02/02/23   Ihor Austin, NP  desvenlafaxine (PRISTIQ) 50 MG 24 hr tablet Take 50 mg by  mouth daily. Exact dose? Per psych    [provider]  gabapentin (NEURONTIN) 300 MG capsule Take 1 capsule (300 mg total) by mouth 3 (three) times daily. 10/22/22   Micki Riley, MD  GEMTESA 75 MG TABS Take 1 tablet by mouth daily.    [provider]  Lancets (ONETOUCH DELICA PLUS LANCET30G) MISC USE TO TEST BLOOD SUGAR TWICE DAILY 05/06/21   Wanda Plump, MD  losartan (COZAAR) 50 MG tablet Take 1 tablet (50 mg total) by mouth daily. 05/31/23   Wanda Plump, MD  metFORMIN (GLUCOPHAGE) 1000 MG tablet Take 1 tablet (1,000 mg total) by mouth 2 (two) times daily with a meal. 03/23/23   [name], Nolon Rod, MD  [facility] VERIO test strip USE TO TEST BLOOD GLUCOSE TWICE DAILY AS DIRECTED 03/31/23   Wanda Plump, MD  pantoprazole (PROTONIX) 40 MG tablet TAKE 1 TABLET BY MOUTH TWICE DAILY 30 MINUTES BEFORE BREAKFAST AND 30 MINUTES BEFORE SUPPER 05/06/23   Iva Boop, MD  pioglitazone (ACTOS) 30 MG tablet Take 1 tablet (30 mg total) by mouth daily. 03/09/23   Wanda Plump, MD  tamsulosin (FLOMAX) 0.4 MG CAPS capsule Take 0.4 mg by mouth daily.    [provider]      Allergies    Patient has no known allergies.    Review of Systems  Review of Systems  Gastrointestinal:  Positive for vomiting.  Neurological:  Positive for weakness.  Psychiatric/Behavioral:  The patient has insomnia.     Physical Exam Updated Vital Signs BP (!) 157/93 (BP Location: Right Arm)   Pulse 71   Temp 98 F (36.7 C) (Oral)   Resp 16   Ht 5\' 11"  (1.803 m)   Wt 101.3 kg   SpO2 97%   BMI 31.15 kg/m  Physical Exam Vitals and nursing note reviewed.  Constitutional:      General: He is not in acute distress.    Appearance: Normal appearance.  HENT:     Head: Normocephalic and atraumatic.     Nose: Nose normal.     Mouth/Throat:     Mouth: Mucous membranes are moist.     Pharynx: Oropharynx is clear.  Eyes:     Extraocular Movements: Extraocular movements intact.     Conjunctiva/sclera:  Conjunctivae normal.  Cardiovascular:     Rate and Rhythm: Normal rate and regular rhythm.     Heart sounds: Normal heart sounds.  Pulmonary:     Effort: Pulmonary effort is normal.     Breath sounds: Normal breath sounds.  Abdominal:     General: Abdomen is flat.     Palpations: Abdomen is soft.     Tenderness: There is no abdominal tenderness.  Musculoskeletal:        General: Normal range of motion.     Cervical back: Normal range of motion.  Skin:    General: Skin is warm and dry.  Neurological:     General: No focal deficit present.     Mental Status: He is alert and oriented to person, place, and time.     Sensory: No sensory deficit.     Motor: No weakness.  Psychiatric:        Mood and Affect: Mood normal.        Behavior: Behavior normal.     ED Results / Procedures / Treatments   Labs (all labs ordered are listed, but only abnormal results are displayed) Labs Reviewed  COMPREHENSIVE METABOLIC PANEL WITH GFR - Abnormal; Notable for the following components:      Result Value   Sodium 134 (*)    CO2 21 (*)    Glucose, Bld 102 (*)    BUN 25 (*)    Total Bilirubin 1.7 (*)    All other components within normal limits  CBC WITH DIFFERENTIAL/PLATELET  LIPASE, BLOOD  CBG MONITORING, ED    EKG EKG Interpretation Date/Time:  Friday July 30 2023 10:39:46 EDT Ventricular Rate:  71 PR Interval:  172 QRS Duration:  107 QT Interval:  379 QTC Calculation: 412 R Axis:   62  Text Interpretation: Sinus rhythm Baseline wander in lead(s) V2 No significant change since last tracing Confirmed by Linwood Dibbles [phone]) on 07/30/2023 10:43:10 AM  Radiology No results found.  Procedures Procedures    Medications Ordered in ED Medications  losartan (COZAAR) tablet 50 mg (has no administration in time range)  lactated ringers bolus 1,000 mL (1,000 mLs Intravenous New Bag/Given 07/30/23 1121)    ED Course/ Medical Decision Making/ A&P Clinical Course as of 07/30/23 1417   Fri Jul 30, 2023  1208 No severe dehydration, mildly elevated bili on labs. Orthostatics pending. [VK]  1416 HR increased upon standing on orthostatics but patient asymptomatic. He is stable for discharge home with outpatient follow up. [VK]    Clinical Course User Index [VK] Elayne Snare  K, DO                                 Medical Decision Making This patient presents to the ED with chief complaint(s) of N/V, fatigue with pertinent past medical history of DM, HTN, Parkinson's which further complicates the presenting complaint. The complaint involves an extensive differential diagnosis and also carries with it a high risk of complications and morbidity.    The differential diagnosis includes arrhythmia, anemia, dehydration, electrolyte abnormality, gastritis, GERD, pancreatitis, hepatitis, gastroenteritis, medication side effect, orthostatic hypotension  Additional history obtained: Additional history obtained from family Records reviewed Primary Care Documents  ED Course and Reassessment: On patient's arrival he is hemodynamically stable in no acute distress.  EKG on arrival showed normal sinus rhythm without acute ischemic changes and had normal blood sugar on arrival.  Patient will have labs to evaluate for dehydration or cause of his symptoms.  Will be started on fluids and will do orthostatic vitals.  He declined any nausea control at this time and will be closely reassessed.  Independent labs interpretation:  The following labs were independently interpreted: within normal range  Independent visualization of imaging: - N/A  Consultation: - Consulted or discussed management/test interpretation w/ external professional: N/A  Consideration for admission or further workup: Patient has no emergent conditions requiring admission or further work-up at this time and is stable for discharge home with primary care follow-up  Social Determinants of health: N/A    Amount  and/or Complexity of Data Reviewed Labs: ordered.  Risk Prescription drug management.          Final Clinical Impression(s) / ED Diagnoses Final diagnoses:  Dehydration    Rx / DC Orders ED Discharge Orders          Ordered    ondansetron (ZOFRAN) 4 MG tablet  Every 6 hours        07/30/23 1413              Rexford Maus, DO 07/30/23 1417

## 2023-08-02 ENCOUNTER — Encounter: Payer: Self-pay | Admitting: Family Medicine

## 2023-08-02 ENCOUNTER — Ambulatory Visit: Payer: Self-pay

## 2023-08-02 ENCOUNTER — Ambulatory Visit (INDEPENDENT_AMBULATORY_CARE_PROVIDER_SITE_OTHER): Admitting: Family Medicine

## 2023-08-02 VITALS — BP 102/74 | HR 84 | Ht 71.0 in | Wt 204.0 lb

## 2023-08-02 DIAGNOSIS — S46812A Strain of other muscles, fascia and tendons at shoulder and upper arm level, left arm, initial encounter: Secondary | ICD-10-CM | POA: Diagnosis not present

## 2023-08-02 MED ORDER — PREDNISONE 20 MG PO TABS: 40.0000 mg | ORAL_TABLET | Freq: Every day | ORAL | 0 refills | Status: AC

## 2023-08-02 NOTE — Patient Instructions (Signed)
 Heat (pad or rice pillow in microwave) over affected area, 10-15 minutes twice daily.   OK to take Tylenol 3 times daily.  Let us know if you need anything.  Trapezius stretches/exercises Do exercises exactly as told by your health care provider and adjust them as directed. It is normal to feel mild stretching, pulling, tightness, or discomfort as you do these exercises, but you should stop right away if you feel sudden pain or your pain gets worse.   Stretching and range of motion exercises These exercises warm up your muscles and joints and improve the movement and flexibility of your shoulder. These exercises can also help to relieve pain, numbness, and tingling. If you are unable to do any of the following for any reason, do not further attempt to do it.   Exercise A: Flexion, standing     Stand and hold a broomstick, a cane, or a similar object. Place your hands a little more than shoulder-width apart on the object. Your left / right hand should be palm-up, and your other hand should be palm-down. Push the stick to raise your left / right arm out to your side and then over your head. Use your other hand to help move the stick. Stop when you feel a stretch in your shoulder, or when you reach the angle that is recommended by your health care provider. Avoid shrugging your shoulder while you raise your arm. Keep your shoulder blade tucked down toward your spine. Hold for 30 seconds. Slowly return to the starting position. Repeat 2 times. Complete this exercise 3 times per week.  Exercise B: Abduction, supine     Lie on your back and hold a broomstick, a cane, or a similar object. Place your hands a little more than shoulder-width apart on the object. Your left / right hand should be palm-up, and your other hand should be palm-down. Push the stick to raise your left / right arm out to your side and then over your head. Use your other hand to help move the stick. Stop when you feel a  stretch in your shoulder, or when you reach the angle that is recommended by your health care provider. Avoid shrugging your shoulder while you raise your arm. Keep your shoulder blade tucked down toward your spine. Hold for 30 seconds. Slowly return to the starting position. Repeat 2 times. Complete this exercise 3 times per week.  Exercise C: Flexion, active-assisted     Lie on your back. You may bend your knees for comfort. Hold a broomstick, a cane, or a similar object. Place your hands about shoulder-width apart on the object. Your palms should face toward your feet. Raise the stick and move your arms over your head and behind your head, toward the floor. Use your healthy arm to help your left / right arm move farther. Stop when you feel a gentle stretch in your shoulder, or when you reach the angle where your health care provider tells you to stop. Hold for 30 seconds. Slowly return to the starting position. Repeat 2 times. Complete this exercise 3 times per week.  Exercise D: External rotation and abduction     Stand in a door frame with one of your feet slightly in front of the other. This is called a staggered stance. Choose one of the following positions as told by your health care provider: Place your hands and forearms on the door frame above your head. Place your hands and forearms on the door  frame at the height of your head. Place your hands on the door frame at the height of your elbows. Slowly move your weight onto your front foot until you feel a stretch across your chest and in the front of your shoulders. Keep your head and chest upright and keep your abdominal muscles tight. Hold for 30 seconds. To release the stretch, shift your weight to your back foot. Repeat 2 times. Complete this stretch 3 times per week.  Strengthening exercises These exercises build strength and endurance in your shoulder. Endurance is the ability to use your muscles for a long time, even  after your muscles get tired. Exercise E: Scapular depression and adduction  Sit on a stable chair. Support your arms in front of you with pillows, armrests, or a tabletop. Keep your elbows in line with the sides of your body. Gently move your shoulder blades down toward your middle back. Relax the muscles on the tops of your shoulders and in the back of your neck. Hold for 3 seconds. Slowly release the tension and relax your muscles completely before doing this exercise again. Repeat for a total of 10 repetitions. After you have practiced this exercise, try doing the exercise without the arm support. Then, try the exercise while standing instead of sitting. Repeat 2 times. Complete this exercise 3 times per week.  Exercise F: Shoulder abduction, isometric     Stand or sit about 4-6 inches (10-15 cm) from a wall with your left / right side facing the wall. Bend your left / right elbow and gently press your elbow against the wall. Increase the pressure slowly until you are pressing as hard as you can without shrugging your shoulder. Hold for 3 seconds. Slowly release the tension and relax your muscles completely. Repeat for a total of 10 repetitions. Repeat 2 times. Complete this exercise 3 times per week.  Exercise G: Shoulder flexion, isometric     Stand or sit about 4-6 inches (10-15 cm) away from a wall with your left / right side facing the wall. Keep your left / right elbow straight and gently press the top of your fist against the wall. Increase the pressure slowly until you are pressing as hard as you can without shrugging your shoulder. Hold for 10-15 seconds. Slowly release the tension and relax your muscles completely. Repeat for a total of 10 repetitions. Repeat 2 times. Complete this exercise 3 times per week.  Exercise H: Internal rotation     Sit in a stable chair without armrests, or stand. Secure an exercise band at your left / right side, at elbow height. Place a soft  object, such as a folded towel or a small pillow, under your left / right upper arm so your elbow is a few inches (about 8 cm) away from your side. Hold the end of the exercise band so the band stretches. Keeping your elbow pressed against the soft object under your arm, move your forearm across your body toward your abdomen. Keep your body steady so the movement is only coming from your shoulder. Hold for 3 seconds. Slowly return to the starting position. Repeat for a total of 10 repetitions. Repeat 2 times. Complete this exercise 3 times per week.  Exercise I: External rotation     Sit in a stable chair without armrests, or stand. Secure an exercise band at your left / right side, at elbow height. Place a soft object, such as a folded towel or a small pillow, under  your left / right upper arm so your elbow is a few inches (about 8 cm) away from your side. Hold the end of the exercise band so the band stretches. Keeping your elbow pressed against the soft object under your arm, move your forearm out, away from your abdomen. Keep your body steady so the movement is only coming from your shoulder. Hold for 3 seconds. Slowly return to the starting position. Repeat for a total of 10 repetitions. Repeat 2 times. Complete this exercise 3 times per week. Exercise J: Shoulder extension  Sit in a stable chair without armrests, or stand. Secure an exercise band to a stable object in front of you so the band is at shoulder height. Hold one end of the exercise band in each hand. Your palms should face each other. Straighten your elbows and lift your hands up to shoulder height. Step back, away from the secured end of the exercise band, until the band stretches. Squeeze your shoulder blades together and pull your hands down to the sides of your thighs. Stop when your hands are straight down by your sides. Do not let your hands go behind your body. Hold for 3 seconds. Slowly return to the starting  position. Repeat for a total of 10 repetitions. Repeat 2 times. Complete this exercise 3 times per week.  Exercise K: Shoulder extension, prone     Lie on your abdomen on a firm surface so your left / right arm hangs over the edge. Hold a 5 lb weight in your hand so your palm faces in toward your body. Your arm should be straight. Squeeze your shoulder blade down toward the middle of your back. Slowly raise your arm behind you, up to the height of the surface that you are lying on. Keep your arm straight. Hold for 3 seconds. Slowly return to the starting position and relax your muscles. Repeat for a total of 10 repetitions. Repeat 2 times. Complete this exercise 3 times per week.   Exercise L: Horizontal abduction, prone  Lie on your abdomen on a firm surface so your left / right arm hangs over the edge. Hold a 5 lb weight in your hand so your palm faces toward your feet. Your arm should be straight. Squeeze your shoulder blade down toward the middle of your back. Bend your elbow so your hand moves up, until your elbow is bent to an "L" shape (90 degrees). With your elbow bent, slowly move your forearm forward and up. Raise your hand up to the height of the surface that you are lying on. Your upper arm should not move, and your elbow should stay bent. At the top of the movement, your palm should face the floor. Hold for 3 seconds. Slowly return to the starting position and relax your muscles. Repeat for a total of 10 repetitions. Repeat 2 times. Complete this exercise 3 times per week.  Exercise M: Horizontal abduction, standing  Sit on a stable chair, or stand. Secure an exercise band to a stable object in front of you so the band is at shoulder height. Hold one end of the exercise band in each hand. Straighten your elbows and lift your hands straight in front of you, up to shoulder height. Your palms should face down, toward the floor. Step back, away from the secured end of the  exercise band, until the band stretches. Move your arms out to your sides, and keep your arms straight. Hold for 3 seconds. Slowly return to  the starting position. Repeat for a total of 10 repetitions. Repeat 2 times. Complete this exercise 3 times per week.  Exercise N: Scapular retraction and elevation  Sit on a stable chair, or stand. Secure an exercise band to a stable object in front of you so the band is at shoulder height. Hold one end of the exercise band in each hand. Your palms should face each other. Sit in a stable chair without armrests, or stand. Step back, away from the secured end of the exercise band, until the band stretches. Squeeze your shoulder blades together and lift your hands over your head. Keep your elbows straight. Hold for 3 seconds. Slowly return to the starting position. Repeat for a total of 10 repetitions. Repeat 2 times. Complete this exercise 3 times per week.  This information is not intended to replace advice given to you by your health care provider. Make sure you discuss any questions you have with your health care provider. Document Released: 04/13/2005 Document Revised: 12/19/2015 Document Reviewed: 02/28/2015 Elsevier Interactive Patient Education  2017 [employer].
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
## 2023-08-02 NOTE — Telephone Encounter (Signed)
 Chief Complaint: Neck Pain  Symptoms: Neck pain 7/10,  Frequency: Constant since Friday 07/30/23 Pertinent Negatives: Patient denies stiff neck Disposition: [] ED /[] Urgent Care (no appt availability in office) / [x] Appointment(In office/virtual)/ []  [facility]/ [] Home Care/ [] Refused Recommended Disposition /[] [facility]/ []  Follow-up with PCP Additional Notes: Patient states he was seen in the ED on 07/30/23 and was treated for dehydration. Patient reports his neck started to hurt after the ED visit. Patient has been using heating pad, ice and tylenol for the discomfort without much improvement. Patient reports a history of surgery on the area about 30 years ago. Care advice was given and due to the neck pain patient has been scheduled with a provider today for evaluation.   Copied from CRM [id number]. Topic: Clinical - Red Word Triage >> Aug 02, 2023  9:13 AM Efraim Kaufmann C wrote: Red Word that prompted transfer to Nurse Triage: patient was in the emergency room over the weekend and now experiencing extreme neck pain. Son Barbara Cower is on the phone and isn't sure if he should take him back to the emergency room. Reason for Disposition  [1] SEVERE neck pain (e.g., excruciating, unable to do any normal activities) AND [2] not improved after 2 hours of pain medicine  Answer Assessment - Initial Assessment Questions 1. ONSET: "When did the pain begin?"      Friday 07/30/23 2. LOCATION: "Where does it hurt?"      Middle of the back of the neck  3. PATTERN "Does the pain come and go, or has it been constant since it started?"      Constant  4. SEVERITY: "How bad is the pain?"  (Scale 1-10; or mild, moderate, severe)   - NO PAIN (0): no pain or only slight stiffness    - MILD (1-3): doesn't interfere with normal activities    - MODERATE (4-7): interferes with normal activities or awakens from sleep    - SEVERE (8-10):  excruciating pain, unable to do any normal activities      7/10 5.  RADIATION: "Does the pain go anywhere else, shoot into your arms?"     No  6. CORD SYMPTOMS: "Any weakness or numbness of the arms or legs?"     No  7. CAUSE: "What do you think is causing the neck pain?"     I had surgery on the area about 30 years ago  8. NECK OVERUSE: "Any recent activities that involved turning or twisting the neck?"     Wife keeps fall and he has to pick her up  9. OTHER SYMPTOMS: "Do you have any other symptoms?" (e.g., headache, fever, chest pain, difficulty breathing, neck swelling)     No  10. PREGNANCY: "Is there any chance you are pregnant?" "When was your last menstrual period?"       N/A  Protocols used: Neck Pain or Stiffness-A-AH

## 2023-08-04 ENCOUNTER — Ambulatory Visit: Payer: Medicare HMO | Admitting: Physical Therapy

## 2023-08-06 ENCOUNTER — Ambulatory Visit: Payer: Self-pay

## 2023-08-06 ENCOUNTER — Other Ambulatory Visit: Payer: Self-pay | Admitting: Internal Medicine

## 2023-08-06 MED ORDER — METHOCARBAMOL 500 MG PO TABS
500.0000 mg | ORAL_TABLET | Freq: Three times a day (TID) | ORAL | 0 refills | Status: DC | PRN
Start: 2023-08-06 — End: 2023-08-09

## 2023-08-06 NOTE — Telephone Encounter (Signed)
 Spoke w/ Pt- informed that Dr Carmelia Roller has sent a new prescription for him. Follow-up for next week scheduled w/ Dr. Drue Novel.

## 2023-08-06 NOTE — Telephone Encounter (Signed)
 Defer to Dr. Carmelia Roller.

## 2023-08-06 NOTE — Telephone Encounter (Signed)
 New med sent. Finish the prednisone. Sched appt next week w Dr. Drue Novel if still not improving. Ty.

## 2023-08-06 NOTE — Telephone Encounter (Signed)
 Chief Complaint: medication for neck pain Symptoms: pain  Frequency: constant Pertinent Negatives: Patient denies new symptoms Disposition: [] ED /[] Urgent Care (no appt availability in office) / [] Appointment(In office/virtual)/ [x]  [facility]/ [] Home Care/ [] Refused Recommended Disposition /[]  Mobile Bus/ []  Follow-up with PCP Additional Notes:  Improved slightly but now bad again. No new symptoms. He has full ROM in neck but it is painful to move. Requesting prescription for pain medication. Appointment not scheduled at this time, last evaluated on 08/02/23 for hospital follow up with Dr. Carmelia Roller. 08/02/23 started prednisone X 5 days for neck pain. Please review and follow up with patient regarding requested pain medication.   Copied from CRM [id number]. Topic: Clinical - Medication Question >> Aug 06, 2023 10:32 AM [place] J wrote: Reason for CRM: predniSONE (DELTASONE) 20 MG tablet not working for patient's neck pain and he's wondering if he could have something else prescribed. Reason for Disposition  [1] SEVERE neck pain (e.g., excruciating, unable to do any normal activities) AND [2] not improved after 2 hours of pain medicine  Protocols used: Neck Pain or Stiffness-A-AH

## 2023-08-06 NOTE — Addendum Note (Signed)
 Addended by: Radene Gunning on: 08/06/2023 11:51 AM   Modules accepted: Orders

## 2023-08-09 ENCOUNTER — Telehealth: Payer: Self-pay

## 2023-08-09 MED ORDER — METHOCARBAMOL 500 MG PO TABS: 500.0000 mg | ORAL_TABLET | Freq: Three times a day (TID) | ORAL | 0 refills | Status: DC | PRN

## 2023-08-09 NOTE — Telephone Encounter (Signed)
 Initial Comment Caller states her dad had significant neck pain on Monday and was prescribed medication that has not helped at all. She mentions her dad spoke to the doctor today and was supposed to receive a stronger pain medication sent over to his pharmacy but it has not been received. Translation No No Triage Reason Other Nurse Assessment Nurse: Haidee Lev, RN, Tawny Fate Date/Time (Eastern Time): 08/06/2023 7:15:09 PM Confirm and document reason for call. If symptomatic, describe symptoms. ---Caller states dad is having severe neck pain since last Monday and prednisone is not helping. Caller not currently with patient. Caller requesting for new RX Does the patient have any new or worsening symptoms? ---Yes Will a triage be completed? ---No Select reason for no triage. ---Other Please document clinical information provided and list any resource used. ---Caller not currently with patient. Caller advised if pain is that severe to go to UC/ED or call back to triage symptoms. Disp. Time [facility] Time) Disposition Final User 08/06/2023 7:19:19 PM Clinical Call Yes Belkofer, RN, Tawny Fate Final Disposition 08/06/2023 7:19:19 PM Clinical Call Yes Belkofer, RN, Olivia

## 2023-08-09 NOTE — Telephone Encounter (Signed)
 Methocarbamol resent.

## 2023-08-10 ENCOUNTER — Ambulatory Visit: Admitting: Internal Medicine

## 2023-08-18 DIAGNOSIS — H04123 Dry eye syndrome of bilateral lacrimal glands: Secondary | ICD-10-CM | POA: Diagnosis not present

## 2023-08-18 DIAGNOSIS — H52223 Regular astigmatism, bilateral: Secondary | ICD-10-CM | POA: Diagnosis not present

## 2023-08-18 DIAGNOSIS — H524 Presbyopia: Secondary | ICD-10-CM | POA: Diagnosis not present

## 2023-08-18 DIAGNOSIS — H5213 Myopia, bilateral: Secondary | ICD-10-CM | POA: Diagnosis not present

## 2023-08-18 DIAGNOSIS — H35362 Drusen (degenerative) of macula, left eye: Secondary | ICD-10-CM | POA: Diagnosis not present

## 2023-08-18 DIAGNOSIS — E119 Type 2 diabetes mellitus without complications: Secondary | ICD-10-CM | POA: Diagnosis not present

## 2023-08-18 LAB — HM DIABETES EYE EXAM

## 2023-09-04 ENCOUNTER — Other Ambulatory Visit: Payer: Self-pay | Admitting: Internal Medicine

## 2023-09-07 DIAGNOSIS — F341 Dysthymic disorder: Secondary | ICD-10-CM | POA: Diagnosis not present

## 2023-09-21 LAB — PSA: PSA: 5.85

## 2023-09-22 ENCOUNTER — Other Ambulatory Visit: Payer: Self-pay | Admitting: Internal Medicine

## 2023-09-23 ENCOUNTER — Other Ambulatory Visit: Payer: Self-pay | Admitting: Internal Medicine

## 2023-09-28 ENCOUNTER — Encounter: Payer: Self-pay | Admitting: Internal Medicine
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
## 2023-10-21 ENCOUNTER — Ambulatory Visit: Admitting: Physical Therapy

## 2023-11-05 ENCOUNTER — Ambulatory Visit (INDEPENDENT_AMBULATORY_CARE_PROVIDER_SITE_OTHER): Admitting: *Deleted

## 2023-11-05 ENCOUNTER — Ambulatory Visit (INDEPENDENT_AMBULATORY_CARE_PROVIDER_SITE_OTHER): Admitting: Internal Medicine

## 2023-11-05 ENCOUNTER — Encounter: Payer: Self-pay | Admitting: Internal Medicine

## 2023-11-05 VITALS — BP 127/91 | HR 82 | Temp 98.2°F | Resp 18 | Ht 71.0 in | Wt 209.0 lb

## 2023-11-05 DIAGNOSIS — I10 Essential (primary) hypertension: Secondary | ICD-10-CM | POA: Diagnosis not present

## 2023-11-05 DIAGNOSIS — E1159 Type 2 diabetes mellitus with other circulatory complications: Secondary | ICD-10-CM

## 2023-11-05 DIAGNOSIS — Z7984 Long term (current) use of oral hypoglycemic drugs: Secondary | ICD-10-CM | POA: Diagnosis not present

## 2023-11-05 DIAGNOSIS — Z0000 Encounter for general adult medical examination without abnormal findings: Secondary | ICD-10-CM

## 2023-11-05 DIAGNOSIS — Z122 Encounter for screening for malignant neoplasm of respiratory organs: Secondary | ICD-10-CM | POA: Diagnosis not present

## 2023-11-05 DIAGNOSIS — E7800 Pure hypercholesterolemia, unspecified: Secondary | ICD-10-CM

## 2023-11-05 NOTE — Patient Instructions (Addendum)
 [name] , Thank you for taking time out of your busy schedule to complete your Annual Wellness Visit with me. I enjoyed our conversation and look forward to speaking with you again next year. I, as well as your care team,  appreciate your ongoing commitment to your health goals. Please review the following plan we discussed and let me know if I can assist you in the future. Your Game plan/ To Do List     Follow up Visits: Next Medicare AWV with our clinical staff:   11/07/24 1pm  Next Office Visit with your provider: today  Clinician Recommendations:  Aim for 30 minutes of exercise or brisk walking, 6-8 glasses of water, and 5 servings of fruits and vegetables each day.       This is a list of the screening recommended for you and due dates:  Health Maintenance  Topic Date Due   Yearly kidney health urinalysis for diabetes  04/08/2010   Complete foot exam   11/11/2022   Screening for Lung Cancer  08/21/2023   Hemoglobin A1C  10/01/2023   Medicare Annual Wellness Visit  11/27/2023   COVID-19 Vaccine (8 - Pfizer risk 2024-25 season) 10/26/2024*   Flu Shot  11/26/2023   Yearly kidney function blood test for diabetes  07/29/2024   Eye exam for diabetics  08/17/2024   DTaP/Tdap/Td vaccine (3 - Td or Tdap) 10/21/2026   Pneumococcal Vaccine for age over 84  Completed   Hepatitis C Screening  Completed   Zoster (Shingles) Vaccine  Completed   Hepatitis B Vaccine  Aged Out   HPV Vaccine  Aged Out   Meningitis B Vaccine  Aged Out   Colon Cancer Screening  Discontinued  *Topic was postponed. The date shown is not the original due date.    Advanced directives: (Copy Requested) Please bring a copy of your health care power of attorney and living will to the office to be added to your chart at your convenience. You can mail to [facility] [address] [place], [place] [address] or email to [email or web address] Advance Care Planning is important because it:  [x]   Makes sure you receive the medical care that is consistent with your values, goals, and preferences  [x]  It provides guidance to your family and loved ones and reduces their decisional burden about whether or not they are making the right decisions based on your wishes.  Follow the link provided in your after visit summary or read over the paperwork we have mailed to you to help you started getting your Advance Directives in place. If you need assistance in completing these, please reach out to us  so that we can help you!  See attachments for Preventive Care and Fall Prevention Tips.
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
## 2023-11-05 NOTE — Patient Instructions (Signed)
 Recommend a flu shot this fall   Continue checking your blood pressure regularly Blood pressure goal:  between 110/65 and  135/85. If it is consistently higher or lower, let me know     GO TO THE LAB :  Get the blood work   Your results will be posted on MyChart with my comments  Next office visit for a checkup in 4 to 6 months  please make an appointment before you leave today

## 2023-11-06 DIAGNOSIS — E7800 Pure hypercholesterolemia, unspecified: Secondary | ICD-10-CM | POA: Insufficient documentation

## 2023-11-06 LAB — BASIC METABOLIC PANEL WITH GFR
BUN: 14 mg/dL (ref 7–25)
CO2: 21 mmol/L (ref 20–32)
Calcium: 9.1 mg/dL (ref 8.6–10.3)
Chloride: 105 mmol/L (ref 98–110)
Creat: 0.87 mg/dL (ref 0.70–1.28)
Glucose, Bld: 70 mg/dL (ref 65–99)
Potassium: 4.6 mmol/L (ref 3.5–5.3)
Sodium: 139 mmol/L (ref 135–146)
eGFR: 88 mL/min/1.73m2 (ref >=60)

## 2023-11-06 LAB — HEMOGLOBIN A1C
Hgb A1c MFr Bld: 6.1 % — ABNORMAL HIGH (ref <5.7)
Mean Plasma Glucose: 128 mg/dL
eAG (mmol/L): 7.1 mmol/L

## 2023-11-06 NOTE — Assessment & Plan Note (Signed)
 DM: No recent ambulatory CBGs, on metformin , pioglitazone .  Check A1c and micro. HTN: On losartan .  Ambulatory BPs when checked normal.  Check BMP. CAD: Asymptomatic, on aspirin . High cholesterol: On atorvastatin , last LDL satisfactory. Preventive care flu shot this fall RTC for a checkup in 4 to 6 months

## 2023-11-08 ENCOUNTER — Ambulatory Visit: Payer: Self-pay | Admitting: Internal Medicine

## 2023-11-09 DIAGNOSIS — F341 Dysthymic disorder: Secondary | ICD-10-CM | POA: Diagnosis not present

## 2023-11-15 ENCOUNTER — Encounter: Payer: Self-pay | Admitting: Adult Health

## 2023-11-15 ENCOUNTER — Ambulatory Visit: Payer: Medicare HMO | Admitting: Adult Health

## 2023-11-15 VITALS — BP 143/86 | HR 70 | Ht 70.0 in | Wt 210.0 lb

## 2023-11-15 DIAGNOSIS — R42 Dizziness and giddiness: Secondary | ICD-10-CM

## 2023-11-15 DIAGNOSIS — Z8673 Personal history of transient ischemic attack (TIA), and cerebral infarction without residual deficits: Secondary | ICD-10-CM | POA: Diagnosis not present

## 2023-11-15 DIAGNOSIS — G20C Parkinsonism, unspecified: Secondary | ICD-10-CM | POA: Diagnosis not present

## 2023-11-15 DIAGNOSIS — G629 Polyneuropathy, unspecified: Secondary | ICD-10-CM | POA: Diagnosis not present

## 2023-11-15 DIAGNOSIS — R2689 Other abnormalities of gait and mobility: Secondary | ICD-10-CM | POA: Diagnosis not present

## 2023-11-15 MED ORDER — GABAPENTIN 300 MG PO CAPS: 300.0000 mg | ORAL_CAPSULE | Freq: Three times a day (TID) | ORAL | 3 refills | Status: AC

## 2023-11-15 MED ORDER — CARBIDOPA-LEVODOPA 25-100 MG PO TABS: 1.0000 | ORAL_TABLET | Freq: Three times a day (TID) | ORAL | 3 refills | Status: AC
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
## 2023-11-15 NOTE — Patient Instructions (Addendum)
 Your Plan:  Continue Sinemet  1 tab 3 times daily   Continue gabapentin  300mg  three times daily  Try to increase physical activity and exercise. If you are interested in doing additional therapy, please let me know   Ensure you continue to speak with your PCP and urologist regarding medications possibly contributing to dizziness       Follow up in 1 year or call earlier if needed      Thank you for coming to see us  at [facility]. I hope we have been able to provide you high quality care today.  You may receive a patient satisfaction survey over the next few weeks. We would appreciate your feedback and comments so that we may continue to improve ourselves and the health of our patients.

## 2023-11-30 ENCOUNTER — Other Ambulatory Visit: Payer: Self-pay | Admitting: Internal Medicine

## 2023-12-16 ENCOUNTER — Other Ambulatory Visit: Payer: Self-pay | Admitting: Internal Medicine

## 2023-12-24 DIAGNOSIS — F341 Dysthymic disorder: Secondary | ICD-10-CM | POA: Diagnosis not present

## 2023-12-27 ENCOUNTER — Other Ambulatory Visit: Payer: Self-pay | Admitting: Internal Medicine

## 2024-02-10 DIAGNOSIS — Z9621 Cochlear implant status: Secondary | ICD-10-CM | POA: Diagnosis not present

## 2024-02-10 DIAGNOSIS — R251 Tremor, unspecified: Secondary | ICD-10-CM | POA: Diagnosis not present

## 2024-02-10 DIAGNOSIS — Q171 Macrotia: Secondary | ICD-10-CM | POA: Diagnosis not present

## 2024-02-10 DIAGNOSIS — G20A1 Parkinson's disease without dyskinesia, without mention of fluctuations: Secondary | ICD-10-CM | POA: Diagnosis not present

## 2024-02-10 DIAGNOSIS — H903 Sensorineural hearing loss, bilateral: Secondary | ICD-10-CM | POA: Diagnosis not present

## 2024-02-10 DIAGNOSIS — Z4889 Encounter for other specified surgical aftercare: Secondary | ICD-10-CM | POA: Diagnosis not present

## 2024-02-10 DIAGNOSIS — H9193 Unspecified hearing loss, bilateral: Secondary | ICD-10-CM | POA: Diagnosis not present

## 2024-02-10 DIAGNOSIS — H90A22 Sensorineural hearing loss, unilateral, left ear, with restricted hearing on the contralateral side: Secondary | ICD-10-CM | POA: Diagnosis not present

## 2024-02-10 DIAGNOSIS — R270 Ataxia, unspecified: Secondary | ICD-10-CM | POA: Diagnosis not present

## 2024-02-16 ENCOUNTER — Encounter: Payer: Self-pay | Admitting: Internal Medicine

## 2024-02-16 ENCOUNTER — Ambulatory Visit: Admitting: Internal Medicine

## 2024-02-16 VITALS — BP 132/80 | HR 91 | Temp 98.1°F | Resp 18 | Ht 70.0 in | Wt 203.2 lb

## 2024-02-16 DIAGNOSIS — E7800 Pure hypercholesterolemia, unspecified: Secondary | ICD-10-CM

## 2024-02-16 DIAGNOSIS — G20C Parkinsonism, unspecified: Secondary | ICD-10-CM | POA: Diagnosis not present

## 2024-02-16 DIAGNOSIS — E1159 Type 2 diabetes mellitus with other circulatory complications: Secondary | ICD-10-CM | POA: Diagnosis not present

## 2024-02-16 DIAGNOSIS — F321 Major depressive disorder, single episode, moderate: Secondary | ICD-10-CM

## 2024-02-16 DIAGNOSIS — C61 Malignant neoplasm of prostate: Secondary | ICD-10-CM

## 2024-02-16 LAB — MICROALBUMIN / CREATININE URINE RATIO
Creatinine,U: 50.9 mg/dL
Microalb Creat Ratio: 44.4 mg/g — ABNORMAL HIGH (ref 0.0–30.0)
Microalb, Ur: 2.3 mg/dL — ABNORMAL HIGH (ref 0.0–1.9)

## 2024-02-16 LAB — LIPID PANEL
Cholesterol: 125 mg/dL (ref 0–200)
HDL: 69.7 mg/dL (ref >39.00)
LDL Cholesterol: 38 mg/dL (ref 0–99)
NonHDL: 55.33
Total CHOL/HDL Ratio: 2
Triglycerides: 85 mg/dL (ref 0.0–149.0)
VLDL: 17 mg/dL (ref 0.0–40.0)

## 2024-02-16 LAB — HEMOGLOBIN A1C: Hgb A1c MFr Bld: 6.2 % (ref 4.6–6.5)

## 2024-02-16 NOTE — Patient Instructions (Signed)
 GO TO THE LAB :  Get the blood work and provide a urine sample  Then, go to the front desk for the checkout Please make an appointment for a checkup in 4 months     Diabetes  You can check your sugars at different times  - early in AM fasting  ( blood sugar goal 70-130) - 2 hours after a meal (blood sugar goal less than 180)    Please read more detailed instructions below    TYPE 2 DIABETES MELLITUS WITH DIABETIC NEUROPATHY: Your diabetes is well-controlled, and you have some decreased sensation in your little toes due to neuropathy. -Continue taking pioglitazone  and metformin  as prescribed. -Get your hemoglobin A1c checked. -Avoid walking barefoot and inspect your feet nightly for any cuts or swelling.  ATHEROSCLEROTIC HEART DISEASE OF NATIVE CORONARY ARTERY WITHOUT ANGINA PECTORIS: Your coronary artery disease is well-managed. -Continue taking aspirin  for heart disease.  ESSENTIAL HYPERTENSION: Your blood pressure is well-controlled. -Continue taking losartan  as prescribed. -Monitor your blood pressure at home regularly.  HYPERLIPIDEMIA: Your cholesterol levels are stable. -Continue taking atorvastatin  as prescribed. -Get your cholesterol levels checked.  PARKINSON'S DISEASE: Your Parkinson's disease causes difficulty walking, which is managed with medication and physical therapy. -Continue with your physical therapy exercises.  DIZZINESS: Your dizziness is being managed according to your neurologist's recommendations. -Increase your water intake. -Rise slowly from sitting or lying positions.  DEPRESSION AND ANXIETY: Your mood symptoms are managed with regular psychiatric follow-ups. -Continue regular follow-up with your psychiatrist.
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
## 2024-02-17 NOTE — Assessment & Plan Note (Signed)
 DM type II: w/ Neuropathy and CAD Diabetes well-controlled. Neuropathy with decreased sensation in little toes. - Continue pioglitazone  and metformin . - Check hemoglobin A1c. - Feet care discussed. Atherosclerotic Heart Disease of Native Coronary Artery without Angina Pectoris Coronary artery disease well-managed.  No symptoms HTN: - Continue losartan .  Last BMP okay. - Encourage home blood pressure monitoring. High cholesterol: Continue atorvastatin .  Check FLP.  Last LFTs normal Parkinson's Disease saw neurology 11/15/2023.  No changes.  Next visit 1 year  Dizziness Recommend to follow neurologist's advice: increase water intake and rise slowly from sitting or lying positions. Depression and Anxiety Per psychiatry Vaccines: He is up-to-date. RTC 4 months

## 2024-02-20 ENCOUNTER — Ambulatory Visit: Payer: Self-pay | Admitting: Internal Medicine

## 2024-03-05 ENCOUNTER — Other Ambulatory Visit: Payer: Self-pay | Admitting: Internal Medicine

## 2024-03-14 DIAGNOSIS — F341 Dysthymic disorder: Secondary | ICD-10-CM | POA: Diagnosis not present

## 2024-03-24 ENCOUNTER — Other Ambulatory Visit: Payer: Self-pay | Admitting: Internal Medicine

## 2024-04-03 DIAGNOSIS — Z9621 Cochlear implant status: Secondary | ICD-10-CM | POA: Diagnosis not present

## 2024-04-03 DIAGNOSIS — Z45321 Encounter for adjustment and management of cochlear device: Secondary | ICD-10-CM | POA: Diagnosis not present

## 2024-04-03 DIAGNOSIS — Z974 Presence of external hearing-aid: Secondary | ICD-10-CM | POA: Diagnosis not present

## 2024-04-03 DIAGNOSIS — H9193 Unspecified hearing loss, bilateral: Secondary | ICD-10-CM | POA: Diagnosis not present

## 2024-04-07 ENCOUNTER — Ambulatory Visit: Admitting: Internal Medicine

## 2024-04-21 ENCOUNTER — Ambulatory Visit: Admitting: Internal Medicine

## 2024-06-01 ENCOUNTER — Other Ambulatory Visit: Payer: Self-pay | Admitting: Internal Medicine

## 2024-06-20 ENCOUNTER — Ambulatory Visit: Admitting: Internal Medicine

## 2024-11-07 ENCOUNTER — Ambulatory Visit

## 2024-11-20 ENCOUNTER — Ambulatory Visit: Admitting: Adult Health
